# Patient Record
Sex: Female | Born: 1950 | Race: White | Hispanic: No | Marital: Married | Smoking: Current every day smoker
Health system: Southern US, Community
[De-identification: ages and names within clinical notes are randomized; demographics above are authoritative.]

## PROBLEM LIST (undated history)

## (undated) DIAGNOSIS — Z8739 Personal history of other diseases of the musculoskeletal system and connective tissue: Secondary | ICD-10-CM

## (undated) DIAGNOSIS — G8929 Other chronic pain: Secondary | ICD-10-CM

## (undated) DIAGNOSIS — H9191 Unspecified hearing loss, right ear: Secondary | ICD-10-CM

## (undated) DIAGNOSIS — K635 Polyp of colon: Secondary | ICD-10-CM

## (undated) DIAGNOSIS — K529 Noninfective gastroenteritis and colitis, unspecified: Secondary | ICD-10-CM

## (undated) DIAGNOSIS — F419 Anxiety disorder, unspecified: Secondary | ICD-10-CM

## (undated) DIAGNOSIS — F410 Panic disorder [episodic paroxysmal anxiety] without agoraphobia: Secondary | ICD-10-CM

## (undated) DIAGNOSIS — R109 Unspecified abdominal pain: Secondary | ICD-10-CM

## (undated) DIAGNOSIS — F32A Depression, unspecified: Secondary | ICD-10-CM

## (undated) DIAGNOSIS — K5090 Crohn's disease, unspecified, without complications: Secondary | ICD-10-CM

## (undated) DIAGNOSIS — I639 Cerebral infarction, unspecified: Secondary | ICD-10-CM

## (undated) DIAGNOSIS — M797 Fibromyalgia: Secondary | ICD-10-CM

## (undated) DIAGNOSIS — I671 Cerebral aneurysm, nonruptured: Secondary | ICD-10-CM

## (undated) DIAGNOSIS — R519 Headache, unspecified: Secondary | ICD-10-CM

## (undated) DIAGNOSIS — R51 Headache: Secondary | ICD-10-CM

## (undated) DIAGNOSIS — Z720 Tobacco use: Secondary | ICD-10-CM

## (undated) DIAGNOSIS — K219 Gastro-esophageal reflux disease without esophagitis: Secondary | ICD-10-CM

## (undated) DIAGNOSIS — R0789 Other chest pain: Secondary | ICD-10-CM

## (undated) DIAGNOSIS — F329 Major depressive disorder, single episode, unspecified: Secondary | ICD-10-CM

## (undated) DIAGNOSIS — K5010 Crohn's disease of large intestine without complications: Secondary | ICD-10-CM

## (undated) DIAGNOSIS — G459 Transient cerebral ischemic attack, unspecified: Secondary | ICD-10-CM

## (undated) HISTORY — PX: TUBAL LIGATION: SHX77

## (undated) HISTORY — PX: ROTATOR CUFF REPAIR: SHX139

## (undated) HISTORY — DX: Personal history of other diseases of the musculoskeletal system and connective tissue: Z87.39

## (undated) HISTORY — DX: Transient cerebral ischemic attack, unspecified: G45.9

## (undated) HISTORY — DX: Depression, unspecified: F32.A

## (undated) HISTORY — PX: CATARACT EXTRACTION: SUR2

## (undated) HISTORY — DX: Cerebral aneurysm, nonruptured: I67.1

## (undated) HISTORY — PX: ORIF ULNAR FRACTURE: SHX5417

## (undated) HISTORY — DX: Tobacco use: Z72.0

## (undated) HISTORY — DX: Anxiety disorder, unspecified: F41.9

## (undated) HISTORY — DX: Crohn's disease, unspecified, without complications: K50.90

## (undated) HISTORY — DX: Major depressive disorder, single episode, unspecified: F32.9

## (undated) HISTORY — PX: EYE SURGERY: SHX253

## (undated) HISTORY — PX: TONSILLECTOMY: SUR1361

## (undated) HISTORY — PX: VARICOSE VEIN SURGERY: SHX832

## (undated) HISTORY — DX: Unspecified hearing loss, right ear: H91.91

## (undated) HISTORY — DX: Gastro-esophageal reflux disease without esophagitis: K21.9

## (undated) HISTORY — DX: Polyp of colon: K63.5

## (undated) HISTORY — DX: Other chest pain: R07.89

## (undated) HISTORY — PX: VAGINAL HYSTERECTOMY: SUR661

## (undated) HISTORY — DX: Cerebral infarction, unspecified: I63.9

---

## 1999-03-27 ENCOUNTER — Other Ambulatory Visit: Admission: RE | Admit: 1999-03-27 | Discharge: 1999-03-27 | Payer: Self-pay | Admitting: Family Medicine

## 1999-06-06 ENCOUNTER — Ambulatory Visit (HOSPITAL_COMMUNITY): Admission: RE | Admit: 1999-06-06 | Discharge: 1999-06-06 | Payer: Self-pay | Admitting: Gastroenterology

## 1999-06-06 ENCOUNTER — Encounter: Payer: Self-pay | Admitting: Gastroenterology

## 1999-06-29 ENCOUNTER — Ambulatory Visit (HOSPITAL_COMMUNITY): Admission: RE | Admit: 1999-06-29 | Discharge: 1999-06-29 | Payer: Self-pay | Admitting: Gastroenterology

## 2000-09-09 ENCOUNTER — Other Ambulatory Visit: Admission: RE | Admit: 2000-09-09 | Discharge: 2000-09-09 | Payer: Self-pay | Admitting: Family Medicine

## 2002-09-12 ENCOUNTER — Encounter: Payer: Self-pay | Admitting: Emergency Medicine

## 2002-09-12 ENCOUNTER — Emergency Department (HOSPITAL_COMMUNITY): Admission: EM | Admit: 2002-09-12 | Discharge: 2002-09-12 | Payer: Self-pay | Admitting: Emergency Medicine

## 2002-09-14 ENCOUNTER — Ambulatory Visit (HOSPITAL_BASED_OUTPATIENT_CLINIC_OR_DEPARTMENT_OTHER): Admission: RE | Admit: 2002-09-14 | Discharge: 2002-09-14 | Payer: Self-pay | Admitting: Orthopedic Surgery

## 2003-03-05 ENCOUNTER — Other Ambulatory Visit: Admission: RE | Admit: 2003-03-05 | Discharge: 2003-03-05 | Payer: Self-pay | Admitting: Family Medicine

## 2003-05-18 ENCOUNTER — Encounter: Payer: Self-pay | Admitting: Emergency Medicine

## 2003-05-18 ENCOUNTER — Emergency Department (HOSPITAL_COMMUNITY): Admission: EM | Admit: 2003-05-18 | Discharge: 2003-05-19 | Payer: Self-pay | Admitting: Emergency Medicine

## 2003-06-26 ENCOUNTER — Ambulatory Visit (HOSPITAL_BASED_OUTPATIENT_CLINIC_OR_DEPARTMENT_OTHER): Admission: RE | Admit: 2003-06-26 | Discharge: 2003-06-26 | Payer: Self-pay | Admitting: Orthopedic Surgery

## 2003-10-19 ENCOUNTER — Encounter: Admission: RE | Admit: 2003-10-19 | Discharge: 2003-10-19 | Payer: Self-pay | Admitting: Family Medicine

## 2004-04-22 ENCOUNTER — Encounter: Payer: Self-pay | Admitting: Cardiology

## 2004-11-10 ENCOUNTER — Ambulatory Visit: Payer: Self-pay | Admitting: Cardiology

## 2004-11-11 ENCOUNTER — Inpatient Hospital Stay (HOSPITAL_COMMUNITY): Admission: EM | Admit: 2004-11-11 | Discharge: 2004-11-12 | Payer: Self-pay | Admitting: Emergency Medicine

## 2004-11-11 ENCOUNTER — Encounter: Payer: Self-pay | Admitting: Cardiology

## 2004-11-12 ENCOUNTER — Ambulatory Visit: Payer: Self-pay

## 2004-11-12 ENCOUNTER — Encounter: Payer: Self-pay | Admitting: Cardiology

## 2004-11-20 ENCOUNTER — Encounter: Payer: Self-pay | Admitting: Cardiology

## 2004-11-25 ENCOUNTER — Ambulatory Visit: Payer: Self-pay | Admitting: Cardiology

## 2005-03-11 ENCOUNTER — Emergency Department (HOSPITAL_COMMUNITY): Admission: EM | Admit: 2005-03-11 | Discharge: 2005-03-11 | Payer: Self-pay | Admitting: Emergency Medicine

## 2005-03-14 ENCOUNTER — Ambulatory Visit (HOSPITAL_COMMUNITY): Admission: RE | Admit: 2005-03-14 | Discharge: 2005-03-14 | Payer: Self-pay | Admitting: Neurology

## 2005-04-19 ENCOUNTER — Ambulatory Visit (HOSPITAL_COMMUNITY): Admission: RE | Admit: 2005-04-19 | Discharge: 2005-04-19 | Payer: Self-pay | Admitting: Cardiovascular Disease

## 2005-04-22 ENCOUNTER — Ambulatory Visit (HOSPITAL_COMMUNITY): Admission: RE | Admit: 2005-04-22 | Discharge: 2005-04-22 | Payer: Self-pay | Admitting: Neurology

## 2005-08-20 ENCOUNTER — Encounter: Admission: RE | Admit: 2005-08-20 | Discharge: 2005-08-20 | Payer: Self-pay | Admitting: Orthopedic Surgery

## 2006-06-02 ENCOUNTER — Other Ambulatory Visit: Admission: RE | Admit: 2006-06-02 | Discharge: 2006-06-02 | Payer: Self-pay | Admitting: Family Medicine

## 2006-10-28 ENCOUNTER — Ambulatory Visit: Payer: Self-pay | Admitting: Internal Medicine

## 2006-11-10 ENCOUNTER — Ambulatory Visit: Payer: Self-pay

## 2006-11-10 ENCOUNTER — Ambulatory Visit: Payer: Self-pay | Admitting: Internal Medicine

## 2006-11-23 ENCOUNTER — Ambulatory Visit: Payer: Self-pay | Admitting: Cardiology

## 2007-05-03 ENCOUNTER — Ambulatory Visit: Payer: Self-pay | Admitting: Cardiology

## 2007-05-07 ENCOUNTER — Emergency Department (HOSPITAL_COMMUNITY): Admission: EM | Admit: 2007-05-07 | Discharge: 2007-05-07 | Payer: Self-pay | Admitting: Emergency Medicine

## 2007-09-30 ENCOUNTER — Emergency Department (HOSPITAL_COMMUNITY): Admission: EM | Admit: 2007-09-30 | Discharge: 2007-09-30 | Payer: Self-pay | Admitting: Emergency Medicine

## 2008-05-23 ENCOUNTER — Ambulatory Visit (HOSPITAL_COMMUNITY): Admission: RE | Admit: 2008-05-23 | Discharge: 2008-05-23 | Payer: Self-pay | Admitting: Ophthalmology

## 2008-06-20 ENCOUNTER — Ambulatory Visit (HOSPITAL_COMMUNITY): Admission: RE | Admit: 2008-06-20 | Discharge: 2008-06-20 | Payer: Self-pay | Admitting: Ophthalmology

## 2008-11-20 ENCOUNTER — Encounter: Admission: RE | Admit: 2008-11-20 | Discharge: 2008-11-20 | Payer: Self-pay | Admitting: General Surgery

## 2009-03-04 ENCOUNTER — Inpatient Hospital Stay (HOSPITAL_COMMUNITY): Admission: EM | Admit: 2009-03-04 | Discharge: 2009-03-05 | Payer: Self-pay | Admitting: Emergency Medicine

## 2009-03-05 ENCOUNTER — Ambulatory Visit: Payer: Self-pay | Admitting: Gastroenterology

## 2009-03-12 ENCOUNTER — Ambulatory Visit (HOSPITAL_COMMUNITY): Admission: RE | Admit: 2009-03-12 | Discharge: 2009-03-12 | Payer: Self-pay | Admitting: Family Medicine

## 2009-04-15 ENCOUNTER — Encounter: Payer: Self-pay | Admitting: Gastroenterology

## 2009-05-08 ENCOUNTER — Encounter (INDEPENDENT_AMBULATORY_CARE_PROVIDER_SITE_OTHER): Payer: Self-pay | Admitting: *Deleted

## 2009-08-19 ENCOUNTER — Emergency Department (HOSPITAL_COMMUNITY): Admission: EM | Admit: 2009-08-19 | Discharge: 2009-08-19 | Payer: Self-pay | Admitting: Emergency Medicine

## 2009-10-08 ENCOUNTER — Encounter: Payer: Self-pay | Admitting: Cardiology

## 2009-12-01 ENCOUNTER — Telehealth: Payer: Self-pay | Admitting: Internal Medicine

## 2009-12-11 ENCOUNTER — Ambulatory Visit: Payer: Self-pay | Admitting: Cardiology

## 2009-12-11 DIAGNOSIS — K5090 Crohn's disease, unspecified, without complications: Secondary | ICD-10-CM

## 2009-12-11 DIAGNOSIS — R072 Precordial pain: Secondary | ICD-10-CM

## 2009-12-11 DIAGNOSIS — F17200 Nicotine dependence, unspecified, uncomplicated: Secondary | ICD-10-CM | POA: Insufficient documentation

## 2009-12-11 DIAGNOSIS — R002 Palpitations: Secondary | ICD-10-CM | POA: Insufficient documentation

## 2009-12-11 DIAGNOSIS — IMO0001 Reserved for inherently not codable concepts without codable children: Secondary | ICD-10-CM

## 2009-12-11 DIAGNOSIS — F411 Generalized anxiety disorder: Secondary | ICD-10-CM | POA: Insufficient documentation

## 2009-12-11 DIAGNOSIS — H5310 Unspecified subjective visual disturbances: Secondary | ICD-10-CM

## 2009-12-12 ENCOUNTER — Encounter: Payer: Self-pay | Admitting: Cardiology

## 2009-12-16 ENCOUNTER — Encounter: Payer: Self-pay | Admitting: Physician Assistant

## 2009-12-25 ENCOUNTER — Encounter (INDEPENDENT_AMBULATORY_CARE_PROVIDER_SITE_OTHER): Payer: Self-pay | Admitting: *Deleted

## 2010-01-01 ENCOUNTER — Encounter: Payer: Self-pay | Admitting: Cardiology

## 2010-01-01 ENCOUNTER — Ambulatory Visit: Payer: Self-pay | Admitting: Cardiology

## 2010-02-17 ENCOUNTER — Emergency Department (HOSPITAL_COMMUNITY): Admission: EM | Admit: 2010-02-17 | Discharge: 2010-02-17 | Payer: Self-pay | Admitting: Emergency Medicine

## 2010-04-15 ENCOUNTER — Ambulatory Visit: Payer: Self-pay | Admitting: Vascular Surgery

## 2010-06-10 ENCOUNTER — Ambulatory Visit (HOSPITAL_COMMUNITY): Admission: RE | Admit: 2010-06-10 | Discharge: 2010-06-10 | Payer: Self-pay | Admitting: Family Medicine

## 2010-10-29 ENCOUNTER — Emergency Department (HOSPITAL_COMMUNITY)
Admission: EM | Admit: 2010-10-29 | Discharge: 2010-10-29 | Payer: Self-pay | Source: Home / Self Care | Admitting: Emergency Medicine

## 2010-12-17 NOTE — Procedures (Signed)
Summary: Holter Monitor  Holter Monitor   Imported By: Gurney Maxin, RN, BSN 12/24/2009 10:33:59  _____________________________________________________________________  External Attachment:    Type:   Image     Comment:   External Document  Appended Document: Holter Monitor Pt notified of results by letter.

## 2010-12-17 NOTE — Progress Notes (Signed)
Summary: Office Visit (DR. Olevia Perches)  Office Visit (DR. Olevia Perches)   Imported By: Delfino Lovett 12/11/2009 09:18:20  _____________________________________________________________________  External Attachment:    Type:   Image     Comment:   External Document

## 2010-12-17 NOTE — Progress Notes (Signed)
Summary: Schedule Colonoscopy  Phone Note Outgoing Call Call back at [facility] Phone [phone]   Call placed by: Bernita Buffy CMA Deborra Medina),  December 01, 2009 10:12 AM Call placed to: Patient Summary of Call: patient was sleeping I will call her back later today Initial call taken by: Bernita Buffy CMA Deborra Medina),  December 01, 2009 10:13 AM  Follow-up for Phone Call        Left message on patients machine to call back.  Follow-up by: Bernita Buffy CMA Deborra Medina),  December 08, 2009 3:47 PM  Additional Follow-up for Phone Call Additional follow up Details #1::        No Answer Additional Follow-up by: Bernita Buffy CMA (AAMA),  December 17, 2009 12:08 PM

## 2010-12-17 NOTE — Letter (Signed)
Summary: Internal Correspondence  Internal Correspondence   Imported By: Cher Nakai 12/12/2009 10:03:16  _____________________________________________________________________  External Attachment:    Type:   Image     Comment:   External Document

## 2010-12-17 NOTE — Letter (Signed)
Summary: Risk analyst at [facility]. [address]   [place], [place] [address]   Phone: [phone]  Fax: [phone]        December 25, 2009 MRN: [id number]    [facility] [place], [place]  [address]    Dear Ms. Hickam,  Your test ordered by Rande Lawman has been reviewed by your physician (or physician assistant) and was found to be normal or stable. Your physician (or physician assistant) felt no changes were needed at this time.  ____ Echocardiogram  ____ Cardiac Stress Test  ____ Lab Work  ____ Peripheral vascular study of arms, legs or neck  ____ CT scan or X-ray  ____ Lung or Breathing test  __X__ Other: Heart Monitor   Thank you.   Gurney Maxin, RN, BSN    Bryon Lions, M.D., F.A.C.C. Maceo Pro, M.D., F.A.C.C. Cammy Copa, M.D., F.A.C.C. Vonda Antigua, M.D., F.A.C.C. Vita Barley, M.D., F.A.C.C. Mare Ferrari, M.D., F.A.C.C. Emi Belfast, PA-C

## 2010-12-17 NOTE — Progress Notes (Signed)
Summary: Office Visit  Office Visit   Imported By: Delfino Lovett 12/11/2009 09:19:17  _____________________________________________________________________  External Attachment:    Type:   Image     Comment:   External Document

## 2010-12-17 NOTE — Assessment & Plan Note (Signed)
Summary: PALPS*PT HAS SEEN [facility] IN [place]   Visit Type:  Follow-up Referring Provider:  Dr. Adriana Reams Primary Provider:  Haynes Hoehn NP  CC:  Palpitations and visual disturbances.  History of Present Illness: The patient is referred for evaluation of an episode occurring on October 5. She awoke but couldn't see or hear. She felt like she was in a tunnel. She said things slowly resolved and her hearing is back to baseline which includes some decreased hearing chronically in the right ear. She says that her site is not yet back to baseline. 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 She wonders whether this could have been involved though she is seeing a neurologist and has a CT of her head scheduled. She has also seen Dr. Redmond Pulling in ENT. She had carotid Dopplers done demonstrating less than 50% right stenosis. She is referred here for evaluation of a possible cardiac source for these complaints.  Her past cardiac history has included evaluation for chest discomfort with negative stress perfusion studies in 2005 10 2007. She does feel palpitations. These have been going on for quite a while. They may be isolated skipped beats or rapid heart rates. She feels a rapid heart rates when she is anxious. When this happens she may get diaphoretic and nauseated. 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 Though this cannot bring on chest pain she does get short of breath with this. She said since all this started she has had increased fatigue. She has felt lightheaded. She has not had syncope.  Preventive Screening-Counseling & Management  Alcohol-Tobacco     Smoking Status: current     Packs/Day: 0.33     Year Started: 1972  Comments: Pt smoked off/on since she was 19. She states 1 pk will last her about 3 days.  Current Medications (verified): 1)  Aspirin 81 Mg Tbec (Aspirin) .... Take One Tablet By Mouth Daily 2)  Multivitamins  Tabs (Multiple Vitamin) .... Take 1 Tablet By Mouth Once A Day 3)   Magnesium 300 Mg Caps (Magnesium) .... Take 1 Capsule By Mouth Once A Day 4)  Vitamin D3 1000 Unit Tabs (Cholecalciferol) .... Take 1 Tablet By Mouth Once A Day 5)  Fish Oil 1000 Mg Caps (Omega-3 Fatty Acids) .... Take 1 Capsule By Mouth Once A Day 6)  [place] Cohosh 540 Mg Caps (Friscia Cohosh) .... Take 1 Capsule By Mouth Once A Day 7)  Fluorometholone 0.1 % Susp (Fluorometholone) .... Instill 1 Drop in (L) Eye Two Times A Day 8)  Diclofenac Sodium 0.1 % Soln (Diclofenac Sodium) .... Instill 1 Drop in (B) Eyes Two Times A Day 9)  Systane 0.4-0.3 % Soln (Polyethyl Glycol-Propyl Glycol) .... Instill 1 Drop Into Each Eye Four Times Per Day As Needed Eye Dryness  Allergies: 1)  Demerol 2)  Phenergan 3)  Darvocet  Comments:  Nurse/Medical Assistant: The patient's medications were reviewed with the patient and were updated in the Medication List. Pt brought a list of medications to office visit.  Gurney Maxin, RN, BSN (December 11, 2009 1:37 PM)  Past History:  Past Medical History: Atypical chest pain. Tobacco Abuse  History of fibromyalgia.  Crohn's disease.   Depression  Anxiety  "Brain Aneurysm"  Family History: Positive for diabetes in mother and brother  Social History: One pack/3 days tobaccoSmoking Status:  current Packs/Day:  0.33  Review of Systems       Positive for headaches, fatigue, decreased vision, short-term memory problems, lower extremity  leg pain, varicose veins.  Otherwise negative for all other systems.  Vital Signs:  Patient profile:   60 year old female Height:      63 inches Weight:      120.25 pounds BMI:     21.38 Pulse rate:   80 / minute BP sitting:   104 / 70  (left arm) Cuff size:   regular  Vitals Entered By: Gurney Maxin, RN, BSN (December 11, 2009 1:27 PM) CC: Palpitations, visual disturbances Comments Pt states she had a spell on 10/5. She states it was like she was in tunnel. 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 She states hearing is some bettet  but not as good as it was. She also states vision is not completely resolved in (L) eye. She has seen Dr. Brandon Melnick (neuro) and Dr. Ok Anis (ENT).    Physical Exam  General:  Well developed, well nourished, in no acute distress. Head:  normocephalic and atraumatic Eyes:  PERRLA/EOM intact; conjunctiva and lids normal. Mouth:   Oral mucosa normal. Neck:  Neck supple, no JVD. No masses, thyromegaly or abnormal cervical nodes. Chest Wall:  no deformities or breast masses noted Lungs:  Clear bilaterally to auscultation and percussion. Abdomen:  Bowel sounds positive; abdomen soft and non-tender without masses, organomegaly, or hernias noted. No hepatosplenomegaly. Msk:  Back normal, normal gait. Muscle strength and tone normal. Extremities:  No clubbing or cyanosis. Neurologic:  Alert and oriented x 3. Skin:  Intact without lesions or rashes. Cervical Nodes:  no significant adenopathy Axillary Nodes:  no significant adenopathy Inguinal Nodes:  no significant adenopathy Psych:  Normal affect.   Detailed Cardiovascular Exam  Neck    Carotids: Carotids full and equal bilaterally without bruits.      Neck Veins: Normal, no JVD.    Heart    Inspection: no deformities or lifts noted.      Palpation: normal PMI with no thrills palpable.      Auscultation: regular rate and rhythm, S1, S2 without murmurs, rubs, gallops, or clicks.    Vascular    Abdominal Aorta: no palpable masses, pulsations, or audible bruits.      Femoral Pulses: normal femoral pulses bilaterally.      Pedal Pulses: normal pedal pulses bilaterally.      Radial Pulses: normal radial pulses bilaterally.      Peripheral Circulation: no clubbing, cyanosis, or edema noted with normal capillary refill.     Impression & Recommendations:  Problem # 1:  VISUAL IMPAIRMENT (ICD-368.10) The etiology of this is not clear. I'm bad she's having a  neurologic workup and encourage her to followup with this. I would check an echocardiogram to make sure she has a structurally normal heart and no obvious site for thromboembolism. She may need referral to an ophthalmologist.  Problem # 2:  TOBACCO ABUSE (ICD-305.1) She understands the need to stop smoking.  Problem # 3:  PALPITATIONS (ICD-785.1)  There visit small chance that this could be related to the above complaints. Therefore, I will place a 48 hour Holter monitor to see if there are any dysrhythmias such as atrial fibrillation that could be contributing.  Her updated medication list for this problem includes:    Aspirin 81 Mg Tbec (Aspirin) .Marland Kitchen... Take one tablet by mouth daily  Problem # 4:  PRECORDIAL PAIN (ICD-786.51)  She has atypical complaints and has had negative stress  perfusion studies. No further workup was suggested. Orders: EKG w/ Interpretation (93000) Holter Monitor (Holter Monitor) 2-D Echocardiogram (2D Echo)  Her updated medication list for this problem includes:    Aspirin 81 Mg Tbec (Aspirin) .Marland Kitchen... Take one tablet by mouth daily  Patient Instructions: 1)  Your physician has requested that you have an echocardiogram.  Echocardiography is a painless test that uses sound waves to create images of your heart. It provides your doctor with information about the size and shape of your heart and how well your heart's chambers and valves are working.  This procedure takes approximately one hour. There are no restrictions for this procedure. 2)  Your physician has recommended that you wear a holter monitor.  Holter monitors are medical devices that record the heart's electrical activity. Doctors most often use these monitors to diagnose arrhythmias. Arrhythmias are problems with the speed or rhythm of the heartbeat. The monitor is a small, portable device. You can wear one while you do your normal daily activities. This is usually used to diagnose what is causing  palpitations/syncope (passing out).

## 2011-01-21 DIAGNOSIS — N951 Menopausal and female climacteric states: Secondary | ICD-10-CM | POA: Insufficient documentation

## 2011-01-21 DIAGNOSIS — N952 Postmenopausal atrophic vaginitis: Secondary | ICD-10-CM | POA: Insufficient documentation

## 2011-01-21 DIAGNOSIS — H409 Unspecified glaucoma: Secondary | ICD-10-CM | POA: Insufficient documentation

## 2011-01-21 DIAGNOSIS — M810 Age-related osteoporosis without current pathological fracture: Secondary | ICD-10-CM | POA: Insufficient documentation

## 2011-01-21 DIAGNOSIS — I8390 Asymptomatic varicose veins of unspecified lower extremity: Secondary | ICD-10-CM | POA: Insufficient documentation

## 2011-01-21 DIAGNOSIS — H04129 Dry eye syndrome of unspecified lacrimal gland: Secondary | ICD-10-CM | POA: Insufficient documentation

## 2011-01-22 DIAGNOSIS — E559 Vitamin D deficiency, unspecified: Secondary | ICD-10-CM | POA: Insufficient documentation

## 2011-02-03 LAB — POCT CARDIAC MARKERS
CKMB, poc: 1.2 ng/mL (ref 1.0–8.0)
Myoglobin, poc: 40.3 ng/mL (ref 12–200)
Troponin i, poc: <0.05 ng/mL (ref 0.00–0.09)

## 2011-02-03 LAB — BASIC METABOLIC PANEL
BUN: 11 mg/dL (ref 6–23)
Creatinine, Ser: 0.86 mg/dL (ref 0.4–1.2)
GFR calc Af Amer: >60 mL/min (ref >60)
GFR calc non Af Amer: >60 mL/min (ref >60)
Potassium: 3.9 mEq/L (ref 3.5–5.1)

## 2011-02-03 LAB — DIFFERENTIAL
Lymphocytes Relative: 27 % (ref 12–46)
Lymphs Abs: 1.8 10*3/uL (ref 0.7–4.0)
Neutrophils Relative %: 62 % (ref 43–77)

## 2011-02-03 LAB — CBC
Platelets: 191 10*3/uL (ref 150–400)
RBC: 4.07 MIL/uL (ref 3.87–5.11)
WBC: 6.5 10*3/uL (ref 4.0–10.5)

## 2011-02-18 LAB — CBC
MCHC: 33.8 g/dL (ref 30.0–36.0)
MCV: 95 fL (ref 78.0–100.0)
RBC: 4.08 MIL/uL (ref 3.87–5.11)

## 2011-02-18 LAB — BASIC METABOLIC PANEL
BUN: 7 mg/dL (ref 6–23)
CO2: 28 mEq/L (ref 19–32)
Chloride: 107 mEq/L (ref 96–112)
Creatinine, Ser: 0.72 mg/dL (ref 0.4–1.2)
Glucose, Bld: 106 mg/dL — ABNORMAL HIGH (ref 70–99)

## 2011-02-18 LAB — DIFFERENTIAL
Basophils Relative: 1 % (ref 0–1)
Eosinophils Absolute: 0.1 10*3/uL (ref 0.0–0.7)
Monocytes Relative: 7 % (ref 3–12)
Neutrophils Relative %: 61 % (ref 43–77)

## 2011-02-24 LAB — URINALYSIS, ROUTINE W REFLEX MICROSCOPIC
Bilirubin Urine: NEGATIVE
Hgb urine dipstick: NEGATIVE
Protein, ur: NEGATIVE mg/dL
Urobilinogen, UA: 0.2 mg/dL (ref 0.0–1.0)

## 2011-02-24 LAB — DIFFERENTIAL
Basophils Absolute: 0.1 10*3/uL (ref 0.0–0.1)
Basophils Relative: 1 % (ref 0–1)
Eosinophils Relative: 1 % (ref 0–5)
Lymphocytes Relative: 28 % (ref 12–46)
Lymphs Abs: 2.2 10*3/uL (ref 0.7–4.0)
Monocytes Absolute: 0.4 10*3/uL (ref 0.1–1.0)
Neutro Abs: 3.8 10*3/uL (ref 1.7–7.7)
Neutro Abs: 5 10*3/uL (ref 1.7–7.7)
Neutrophils Relative %: 58 % (ref 43–77)

## 2011-02-24 LAB — HEPATIC FUNCTION PANEL
ALT: 13 U/L (ref 0–35)
AST: 23 U/L (ref 0–37)
Albumin: 3.8 g/dL (ref 3.5–5.2)
Alkaline Phosphatase: 64 U/L (ref 39–117)
Bilirubin, Direct: 0.1 mg/dL (ref 0.0–0.3)
Indirect Bilirubin: 0.2 mg/dL — ABNORMAL LOW (ref 0.3–0.9)
Total Bilirubin: 0.3 mg/dL (ref 0.3–1.2)
Total Protein: 6.1 g/dL (ref 6.0–8.3)

## 2011-02-24 LAB — CBC
HCT: 38.8 % (ref 36.0–46.0)
Hemoglobin: 13.1 g/dL (ref 12.0–15.0)
MCHC: 34.2 g/dL (ref 30.0–36.0)
Platelets: 218 10*3/uL (ref 150–400)
Platelets: 222 10*3/uL (ref 150–400)
RDW: 14.6 % (ref 11.5–15.5)
WBC: 7.7 10*3/uL (ref 4.0–10.5)

## 2011-02-24 LAB — PROTIME-INR: Prothrombin Time: 13.9 seconds (ref 11.6–15.2)

## 2011-02-24 LAB — BASIC METABOLIC PANEL
BUN: 7 mg/dL (ref 6–23)
CO2: 26 mEq/L (ref 19–32)
Calcium: 8.6 mg/dL (ref 8.4–10.5)
Calcium: 9.1 mg/dL (ref 8.4–10.5)
Creatinine, Ser: 0.79 mg/dL (ref 0.4–1.2)
GFR calc Af Amer: >60 mL/min (ref >60)
GFR calc non Af Amer: >60 mL/min (ref >60)
Glucose, Bld: 154 mg/dL — ABNORMAL HIGH (ref 70–99)
Potassium: 4.4 mEq/L (ref 3.5–5.1)
Sodium: 143 mEq/L (ref 135–145)

## 2011-02-24 LAB — APTT: aPTT: 32 seconds (ref 24–37)

## 2011-03-15 ENCOUNTER — Emergency Department (HOSPITAL_COMMUNITY)
Admission: EM | Admit: 2011-03-15 | Discharge: 2011-03-15 | Disposition: A | Discharge status: Home / Self Care | Payer: BC Managed Care – PPO | Attending: Emergency Medicine | Admitting: Emergency Medicine

## 2011-03-15 DIAGNOSIS — M2550 Pain in unspecified joint: Secondary | ICD-10-CM | POA: Insufficient documentation

## 2011-03-15 DIAGNOSIS — K5090 Crohn's disease, unspecified, without complications: Secondary | ICD-10-CM | POA: Insufficient documentation

## 2011-03-15 DIAGNOSIS — Z7982 Long term (current) use of aspirin: Secondary | ICD-10-CM | POA: Insufficient documentation

## 2011-03-15 DIAGNOSIS — R110 Nausea: Secondary | ICD-10-CM | POA: Insufficient documentation

## 2011-03-15 DIAGNOSIS — Z79899 Other long term (current) drug therapy: Secondary | ICD-10-CM | POA: Insufficient documentation

## 2011-03-15 DIAGNOSIS — R5381 Other malaise: Secondary | ICD-10-CM | POA: Insufficient documentation

## 2011-03-15 DIAGNOSIS — IMO0001 Reserved for inherently not codable concepts without codable children: Secondary | ICD-10-CM | POA: Insufficient documentation

## 2011-03-15 LAB — DIFFERENTIAL
Basophils Absolute: 0 10*3/uL (ref 0.0–0.1)
Basophils Relative: 1 % (ref 0–1)
Neutro Abs: 3.8 10*3/uL (ref 1.7–7.7)
Neutrophils Relative %: 60 % (ref 43–77)

## 2011-03-15 LAB — COMPREHENSIVE METABOLIC PANEL
ALT: 12 U/L (ref 0–35)
AST: 21 U/L (ref 0–37)
Albumin: 3.9 g/dL (ref 3.5–5.2)
CO2: 28 mEq/L (ref 19–32)
Chloride: 105 mEq/L (ref 96–112)
GFR calc Af Amer: >60 mL/min (ref >60)
GFR calc non Af Amer: >60 mL/min (ref >60)
Sodium: 139 mEq/L (ref 135–145)
Total Bilirubin: 0.5 mg/dL (ref 0.3–1.2)

## 2011-03-15 LAB — URINALYSIS, ROUTINE W REFLEX MICROSCOPIC
Hgb urine dipstick: NEGATIVE
Nitrite: NEGATIVE
Specific Gravity, Urine: 1.01 (ref 1.005–1.030)
Urobilinogen, UA: 0.2 mg/dL (ref 0.0–1.0)

## 2011-03-15 LAB — CBC
Hemoglobin: 13.1 g/dL (ref 12.0–15.0)
RBC: 4.24 MIL/uL (ref 3.87–5.11)
WBC: 6.3 10*3/uL (ref 4.0–10.5)

## 2011-03-30 NOTE — Consult Note (Signed)
NEW PATIENT CONSULTATION   Stacy Hansen, Stacy Hansen  DOB:  03/21/1951                                       04/15/2010  [id number]#:[id number]   The patient presents today for evaluation of bilateral lower extremity  venous pathology.  She is a very pleasant 60 year old white female with  progressively severe discomfort in her right leg.  She reports that this  is an aching sensation with prolonged standing in her right calf.  She  works at [employer] and has to stand for prolonged periods of time.  She  does also have pain specifically over varicosities in her medial right  calf.  She does not have similar symptoms in her left leg.  She does not  have any history of DVT or bleeding from these.  She reports some relief  with elevation but she is unable to take ibuprofen due to gastric upset.   Her past medical history is significant for diabetes, hypertension,  elevated cholesterol, cardiac disease.   Family history is negative for premature atherosclerotic disease.   SOCIAL HISTORY:  She is married with two children.  She does smoke  cigarettes, does not drink alcohol on a regular basis.   REVIEW OF SYSTEMS:  Positive for no weight loss or weight gain.  Her  weight is 120 pounds.  She is 5 feet 3 inches tall.  Cardiac, pulmonary, GI is negative except for aside from stomach  irritation with acids.  GU:  Negative.  VASCULAR:  Positive for pain in legs with prolonged standing.  NEUROLOGIC:  Report headaches.  MUSCULOSKELETAL:  Joint pain.  PSYCHIATRIC:  Anxiety and depression.  HEENT:  Change in eyesight and hearing.  SKIN:  Negative.   PHYSICAL EXAM:  General:  A well-developed, well-nourished white female  appearing stated age.  Vital signs:  Blood pressure is 109/59, pulse 73,  respirations 18.  HEENT:  Is normal.  She is in no acute distress in  general.  Abdomen:  Soft, nontender, no masses.  Her pulse status is 2+  radial and 2+ dorsalis pedis pulses bilaterally.   Neurologic:  Shows no  focal weakness or paresthesias.  Skin:  Without ulcers or rashes.  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   She underwent noninvasive vascular laboratory studies in our office and  this reveals reflux throughout her right great saphenous vein extending  into these varicosities in her calf.  Deep system shows no evidence of  DVT or reflux.  She has normal caliber saphenous vein in the left leg.  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  I discussed options  with the patient.  I explained initial treatment would be elevation and  compression garments.  She is fitted today for graduated compression  stockings thigh high 20-30 mmHg and instructed on the use of these.  We  will see her again in 3 months to determine if this is improving her  symptoms.  She also was concerned regarding the appearance of  telangiectasia on her left leg.  She does not have any evidence of DVT  or any evidence of reflux on this side and I have explained the option  of sclerotherapy for cosmetic treatment of these.  She wishes to proceed  with this and will schedule this with Thea Silversmith, RN in our office and  explained the procedure for sclerotherapy.  I will see her again in 3  months.     Rosetta Posner, M.D.  Electronically Signed   TFE/MEDQ  D:  04/15/2010  T:  04/16/2010  Job:  [id number]   cc:   Dr Haynes Hoehn

## 2011-03-30 NOTE — Procedures (Signed)
LOWER EXTREMITY VENOUS REFLUX EXAM   INDICATION:  Patient states painful right leg varicose veins and  bilateral spider veins.   EXAM:  Using color-flow imaging and pulse Doppler spectral analysis, the  right common femoral, superficial femoral, popliteal, posterior tibial,  greater and lesser saphenous veins are evaluated.  There is no evidence  suggesting deep venous insufficiency in the right lower extremity.   The right saphenofemoral junction and greater saphenous vein are not  competent with reflux of >500 milliseconds.   The right proximal short saphenous vein demonstrates competency.   GSV Diameter (used if found to be incompetent only)                                            Right    Left  Proximal Greater Saphenous Vein           0.49 cm  cm  Proximal-to-mid-thigh                     cm       cm  Mid thigh                                 0.53 cm  cm  Mid-distal thigh                          cm       cm  Distal thigh                              0.63 cm  cm  Knee                                      0.63 cm  cm   IMPRESSION:  1. Right greater saphenous vein reflux of >500 milliseconds noted.  2. The right greater saphenous vein is not tortuous  3. The right deep venous system is competent.  4. The right proximal short saphenous vein is competent.   ___________________________________________  Rosetta Posner, M.D.   CH/MEDQ  D:  04/15/2010  T:  04/15/2010  Job:  [id number]

## 2011-03-30 NOTE — Discharge Summary (Signed)
Stacy Hansen, Stacy Hansen               ACCOUNT NO.:  [id number]   MEDICAL RECORD NO.:  [id number]          PATIENT TYPE:  INP   LOCATION:  A310                          FACILITY:  [facility]   PHYSICIAN:  Salem Caster, DO    DATE OF BIRTH:  Dec 09, 1950   DATE OF ADMISSION:  03/04/2009  DATE OF DISCHARGE:  04/21/2010LH                               DISCHARGE SUMMARY   DISCHARGE DIAGNOSES:  1. Abdominal pain.  2. Nausea, vomiting, resolved.  3. Hypokalemia, resolved.  4. Past history of Crohn disease.  5. History of fibromyalgia.   BRIEF HOSPITAL COURSE:  This is a 60 year old female who presented with  a right lower quadrant abdominal pain with nausea.  The patient awoke,  she started having tingling sensation at that area.  Pain progressed  throughout the day, decided to come to the emergency room to be  evaluated.  CT scan of her abdomen was ordered.  The patient was having  problems keeping down her contrast.  The patient finally did have  contrast for her CT scan, which showed no acute abnormalities, showed  tiny nonspecific low attenuation foci in the liver too small to  characterize.  Her pelvis showed a questionable tiny amount of free  pelvic fluid.  1. Tiny umbilical hernia.  2. Otherwise, negative CT of the pelvis.  Gastroenterology was      consulted.  They had a differential of possible Crohn's flare      versus undetected appendicitis.  They have left her now to her      medical regimen.  The patient's diet was increased to full liquids,      they felt the patient tolerated this, and she will be discharged to      home with followup with his primary care physician.  The patient      has tolerated the full liquid diet, and the patient really wants to      go home, we are discharging to home.   DISCHARGE MEDICATION:  1. [place] cohosh 1 tab daily.  2. Magnesium at previous dose daily.  3. Aspirin 81 mg daily.  4. Multivitamin 1 tablet daily.  5. Morphine 1-2 tab every 4-6  hours p.r.n.   VITAL SIGNS:  Temperature is 98.1, pulse 62, respirations 18, blood  pressure 99/60.  He is stating 97% on room air.   LABORATORY DATA:  Sodium 143, potassium 4.4, chloride 110, CO2 is 29,  glucose 115, BUN 7, creatinine 0.70.  White blood cell count is 7.7,  hemoglobin of 13.1, hematocrit 38.8, platelet count is 218,000.  PT  30.9, INR 1.0, PTT is 32, total bilirubin 0.3, alkaline phosphatase 364,  AST 23, ALT 13, total protein 6.1, albumin is 3.8.   CONDITION AT DISCHARGE:  Stable.   DISPOSITION:  The patient will be discharged to home.   DISCHARGE INSTRUCTIONS:  The patient is to have a full liquid diet and  advance as tolerated.  The patient is to increase her activity slowly.  The patient is to follow up with Dr. Laurance Flatten in the next 1-2 days.  The  patient  to take all medications as directed.  The patient is to return  to the emergency room should she have any severe abdominal pain, call  her primary care physician and/or call 911.      Salem Caster, DO  Electronically Signed     SM/MEDQ  D:  03/05/2009  T:  03/06/2009  Job:  [id number]   cc:   Dr. Laurance Flatten

## 2011-03-30 NOTE — H&P (Signed)
NAMELAURALIE, BLACKSHER               ACCOUNT NO.:  [id number]   MEDICAL RECORD NO.:  [id number]          PATIENT TYPE:  INP   LOCATION:  A310                          FACILITY:  [facility]   PHYSICIAN:  Anselmo Pickler, DO    DATE OF BIRTH:  05/07/1951   DATE OF ADMISSION:  03/04/2009  DATE OF DISCHARGE:  LH                              HISTORY & PHYSICAL   PRIMARY CARE PHYSICIAN:  The patient's primary care physician is Chipper Herb, M.D.   CHIEF COMPLAINT AND HISTORY OF THE PRESENT ILLNESS:  The patient is a 60-  year-old Caucasian female who presented to the emergency room  complaining of abdominal pain.  She states that it started today.  It  felt like it was flank pain that, of course, worsened, is located on the  right side.  It is characterized as sharp and is rated as an 8/10.  It  is aggravated by nothing and it is relieved with nothing; and, the  symptoms are associated with back pain.  The patient was worked up for  what possibly could have been a kidney stone; however, only a KUB was  done.  While she was in the ED she continued to have nausea and  vomiting.  They tried to give her oral contrast and she could not  tolerate it.  At that point in time it was determined by the ED doctor,  as they were concerned about what the source or the etiology of this  abdominal pain was, but could not get testing to determine this, that  [facility] would admit her to try to get the nausea and vomiting under  control, and then try to obtain a CT of the abdomen and pelvis for  additional testing.   PAST MEDICAL HISTORY:  The patient's past medical history is significant  for:  1. Crohn's disease.  2. Fibromyalgia.  3. Aneurysm.   PAST SURGICAL HISTORY:  The past surgical history is significant for:  1. Hysterectomy.  2. Rotator cuff repair.  3. Tubal ligation.  4. Left forearm surgery.   SOCIAL HISTORY:  The patient is a smoker and smokes about a half-pack a  day.   ALLERGIES:   THE PATIENT'S ALLERGIES TO DEMEROL, PHENERGAN AND DARVOCET.   MEDICATIONS:  Premarin, aspirin, multivitamins and magnesium.   REVIEW OF SYSTEMS:  CONSTITUTIONAL:  The review of systems is positive  for changes in appetite.  Negative for fever and chills.  HEENT:  Eyes  are negative for changes in vision.  Ears, nose, mouth and throat are  negative for changes in hearing, smell or taste.  CARDIOVASCULAR:  Negative for chest pain or palpitations.  RESPIRATORY:  Negative for  shortness of breath or wheezing.  GASTROINTESTINAL:  Positive for nausea  and abdominal pain.  GENITOURINARY:  Positive for flank pain.  Negative  for dysuria.  MUSCULOSKELETAL:  Positive for arthralgias.  SKIN:  Negative for rash.  NEUROLOGICAL:  Negative for loss of consciousness.   PHYSICAL EXAMINATION:  VITAL SIGNS:  The patient's current vital signs  are as follows:  Blood pressure 96/59, pulse  rate 74, respirations 20  and temperature 97.8.  She has a pulse ox of 98% on room air.  GENERAL APPEARANCE:  In general the patient is a well-developed, well-  nourished Caucasian female who is in no acute distress.  HEENT:  Head is normocephalic and atraumatic.  Eyes are PERRLA.  EOMI.  Sclerae are clear.  No icterus or conjunctival injection.  Ears:  TMs  are visualized bilaterally.  Throat is clear.  Teeth;  upper and lower  dentures.  NECK:  The neck is supple.  No lymphadenopathy.  HEART:  The heart has a regular rate and rhythm.  LUNGS:  The lungs are clear to auscultation bilaterally.  No rales,  wheezes or rhonchi.  ABDOMEN:  The abdomen is soft, nontender and nondistended.  No  organomegaly noted.  GENITALIA AND PELVIS:  Positive right lower quadrant tenderness.  BACK:  Positive for CVA tenderness on the right side.  NEUROLOGIC:  Cranial nerves II-XII are grossly intact.  Sensation is  normal.  She is alert and oriented x3.   LABORATORY DATA:  The patient's CT of the pelvis without contrast showed  no gross  abnormalities in the pelvis and abdomen.  Sodium 140, potassium  2.9, chloride 105, carbon dioxide 26, glucose 159, BUN 70, and  creatinine 0.79.  Urine is clear.  White count 6.6, hemoglobin 13.6,  hematocrit 39.8, platelet count 222,000.   ASSESSMENT AND PLAN:  1. Abdominal pain.  2. Nausea and vomiting.  3. Hypokalemia.   The plan will be to:  1. Admit the patient to the Service of [facility].  2. We will use antiemetics and intravenous fluids until the patient is      stable enough to try oral contrast for computerized tomography of      the abdomen and pelvis.  3. I will also consult gastroenterology.  4. Await any pending results regarding the test.  5. For and hypokalemia we will go ahead and replace with intravenous      potassium until the patient is able to swallow pills.  6. We will do deep venous thrombosis and gastrointestinal prophylaxes.  7. We will continue to monitor the patient and change therapy as      necessary.      Anselmo Pickler, DO  Electronically Signed     CB/MEDQ  D:  03/05/2009  T:  03/05/2009  Job:  [id number]
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
## 2011-04-02 NOTE — Assessment & Plan Note (Signed)
[facility]                            CARDIOLOGY OFFICE NOTE   SUNNY, AGUON                      MRN:          [id number]  DATE:11/23/2006                            DOB:          05-01-1951    Mrs. Matsushima returns to my office today.  She was recently seen in the  office by Richardson Dopp.  She was complaining of chest pain at that time.  He scheduled her to have a nuclear study which was performed on November 10, 2006.  There was normal perfusion with ejection fraction of 72%.  She also had a chest CT that showed no pulmonary embolus.  There was a  small 2 mm nodule in the right upper lobe that was felt to be benign.  Since then, she continues to have chest pain.  It typically occurs with  moving her left upper extremity.   MEDICATIONS:  Include multivitamins and aspirin.   PHYSICAL EXAMINATION:  VITAL SIGNS: Blood pressure 112/71, and her pulse  is 70.  NECK:  Supple.  CHEST:  Clear.  CARDIOVASCULAR:  Regular rate and rhythm.  EXTREMITIES:  Show no edema.   DIAGNOSES:  1. Atypical chest pain.  2. Tobacco abuse.  3. History of fibromyalgia.  4. Crohn's disease.   PLAN:  Mrs. Grether continues to have chest pain, but it is reproduced on  palpation.  It is very atypical.  Her electrocardiogram was normal, and  her Myoview is normal.  CT shows no pulmonary embolus.  Her sed rate is  10.  I do not think we need to pursue further cardiac workup.  It may be  that she is having musculoskeletal pain.  I have asked her to follow up  with Dr. Laurance Flatten for this issue. Note: Tylenol has improved her chest pain  previously.  I discussed risk factor modification including  discontinuing tobacco use.  I will see her back on an as-needed basis.     Denice Bors Stanford Breed, MD, [facility]  Electronically Signed    BSC/MedQ  DD: 11/23/2006  DT: 11/23/2006  Job #: [id number]   cc:   Chipper Herb, M.D.

## 2011-04-02 NOTE — Op Note (Signed)
NAME:  Stacy Hansen, Stacy Hansen                         ACCOUNT NO.:  [id number]   MEDICAL RECORD NO.:  [id number]                   PATIENT TYPE:  AMB   LOCATION:  [place]                                  FACILITY:  [facility]   PHYSICIAN:  Estill Bamberg. Ronnie Derby, M.D.              DATE OF BIRTH:  August 17, 1951   DATE OF PROCEDURE:  06/26/2003  DATE OF DISCHARGE:                                 OPERATIVE REPORT   SURGEON:  Estill Bamberg. Ronnie Derby, M.D.   ASSISTANT:  Eulas Post, P.A.   ANESTHESIA:  General.   INDICATIONS FOR PROCEDURE:  The patient is a 60 year old with MRI evidence  and clinical exam findings of a partial- to full-thickness tear of the  rotator cuff tendon.  Informed consent was obtained.   DESCRIPTION OF PROCEDURE:  The patient was laid supine and administered  general anesthesia without interscalene block due to her denial.  She was  then placed into a beach chair position and the left shoulder was prepped  and draped in the usual sterile fashion.  Diagnostic glenohumeral  arthroscopy was performed through a posterior portal created with a #11  blade, blunt trocar and cannula.  She had a small tear in the rotator cuff  interval.  The labrum was well attached and had a good biceps anchor and no  chondromalacia.  Then created a direct lateral portal with a #11 blade,  blunt trocar and cannula.  I went from posterior into the subacromial space  with the camera and from direct-lateral with the shaver.  Used a great white  shaver to perform aggressive bursectomy in the subacromial space and the  ArthroCare wand to further clean off the bursa from the undersurface of the  acromion.  I performed an anterolateral acromioplasty with a 4 mm bur and  released the CA ligament with the ArthroCare wand and debrided it back to a  stub.  I then debrided the bursa off of the rotator cuff and identified a  very small full-thickness tear in the rotator cuff interval.  I then aborted  the arthroscopic portion of  the case and created a direct lateral mini  incision 2 cm in length.  I cauterized the bleeding skin vessels, pierced  the deltoid raphae, and held it in place with the Arthrex shoulder  retractor.  I then placed two stitches in the rotator cuff tear, which was  approximately 1 cm in length and not retracted.  It came back together very,  very easily with figure-of-eight sutures.  I then palpated the acromioplasty  to ensure it was adequate and then at this point I evacuated the subacromial  space of fluid and instruments and closed with interrupted 0 and 2-0 Vicryl  sutures and skin with Steri-Strips.  I dressed it with Adaptic, 4x4s, ABDs,  __________ into a simple sling.  Estill Bamberg. Ronnie Derby, M.D.    SDL/MEDQ  D:  06/26/2003  T:  06/26/2003  Job:  [id number]

## 2011-04-02 NOTE — H&P (Signed)
[name], Stacy Hansen               ACCOUNT NO.:  [id number]   MEDICAL RECORD NO.:  [id number]          PATIENT TYPE:  EMS   LOCATION:  MAJO                         FACILITY:  [facility]   PHYSICIAN:  Toby L. Jones Bales, M.D.   DATE OF BIRTH:  23-Oct-1951   DATE OF ADMISSION:  11/10/2004  DATE OF DISCHARGE:                                HISTORY & PHYSICAL   PRIMARY CARE PHYSICIAN:  Unassigned.   REASON FOR VISIT:  Chest pain.   HISTORY OF PRESENT ILLNESS:  Ms. Kilgore is a 60 year old female who presents  with pressure-like chest pain that developed at rest earlier today.  The  pain was 8/10 with radiation to the left shoulder and back.  There was  associated shortness of breath, diaphoresis and nausea.  In the ED, chest  pain did respond nicely to nitroglycerin; the patient says that the pain  decreased to 0.  The patient is currently chest-pain-free.  She denies any  previous episodes of chest pain.  There is no past cardiac workup.  Of note,  there was no relief with the GI cocktail.   PAST MEDICAL HISTORY:  1.  Fracture of the left distal ulna with open reduction and internal      fixation.  2.  Rotator cuff tear with repair.  3.  Crohn's disease.   PAST SURGICAL HISTORY:  1.  Open reduction and internal fixation of left distal ulna.  2.  Repair of rotator cuff.   ALLERGIES:  DEMEROL, PHENERGAN and DARVOCET.   MEDICINES:  Premarin -- patient cannot remember the exact dosage.   SOCIAL HISTORY:  The patient does smoke 1/2 pack per day for approximately  20 years.  She denies alcohol and IV drug abuse.   FAMILY HISTORY:  There is no family history of coronary artery disease or  hypertension.   REVIEW OF SYSTEMS:  A complete review of systems was obtained.  The review  of systems was negative except for that stated in the HPI.   LABORATORIES:  Troponin was negative.  White blood cell count was 8.3,  hemoglobin was 12.8, hematocrit was 37.5, platelet count was 262,000.  Sodium  136, potassium 3.9, chloride 103, carbon dioxide 28, glucose 110, BUN  was 5, creatinine was 0.7, calcium was 8.6, total protein 5.9, albumin 3.6,  AST 18, ALT 11, alkaline phosphatase 66, total bilirubin 0.4.   Chest x-ray showed pulmonary vascular congestion and mild chronic  interstitial lung disease versus mild interstitial pulmonary edema.   EKG showed normal sinus rhythm.   ASSESSMENT AND PLAN:  1.  Chest pain:  I will admit the patient to a telemetry unit.  She will be      provided oxygen, aspirin, sublingual nitroglycerin, beta blocker and      statin.  I will check a cholesterol panel.  An exercise Cardiolite      stress test will be arranged for tomorrow.  EKG will be repeated      tomorrow.  I will continue to follow cardiac enzymes.  2.  Crohn's disease:  The patient says that she is not currently  on      treatment for this.  3.  This patient is a full code.       TLF/MEDQ  D:  11/11/2004  T:  11/11/2004  Job:  [id number]
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
## 2011-04-02 NOTE — Consult Note (Signed)
NAMEELDORIS, BEISER               ACCOUNT NO.:  [id number]   MEDICAL RECORD NO.:  [id number]          PATIENT TYPE:  INP   LOCATION:  6529                         FACILITY:  [facility]   PHYSICIAN:  Kirk Ruths, M.D. LHCDATE OF BIRTH:  [birth year]   DATE OF CONSULTATION:  11/11/2004  DATE OF DISCHARGE:                                   CONSULTATION   CHIEF COMPLAINT:  Chest pain.   HISTORY OF PRESENT ILLNESS:  We were asked to see this 60 year old female  admitted to [facility] 11/10/04 by the _______ hospitalist for  evaluation of chest pain relieved by nitroglycerin.  A stress test has been  ordered for today.   The interview today was somewhat difficult as the patient just woke up and  was somewhat lethargic.  She denied any chest pain this a.m., although she  states she had some soreness in her chest.  She states the pain started  around 7 p.m. 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  She works for [employer].  The  pain was substernal and radiated to her back, was associated with shortness  of breath, a clammy feeling, nausea and dry heaves.  The pain was rated at 7  on a 1-10 scale.  She described it as sharp.  It increased with inspiration  and movement.  She never had similar pain.  It lasted 30-40 minutes.  It was  relieved in the emergency room by nitroglycerin.   PAST MEDICAL HISTORY:  1.  Fracture of the left distal ulna with open reduction internal fixation.  2.  She has had a rotator cuff repair.  3.  She has a history of Crohn's disease followed by Dr. Delfin Edis.  4.  She has had dyspnea for four months including orthopnea.   ALLERGIES:  DEMEROL, PHENERGAN, DARVOCET.   MEDICATIONS PRIOR TO ADMISSION:  1.  Premarin.  2.  Aspirin.  3.  Nitroglycerin p.r.n.  4.  Metoprolol 25 mg b.i.d.  5.  Zocor 20 mg daily.   SOCIAL HISTORY:  The patient lives in [place] with her husband.  She has  children from a previous marriage.  She continues to smoke 1/2 pack per day  and has done so for 20 years.  She does not use alcohol.  She works at [employer].  Her husband is a patient of Dr. Rosezella Florida.   FAMILY HISTORY:  Her mother died at age 89 from diabetes.  Her father died  at age 34 from cirrhosis.  She has four brothers and four sisters, none of  whom have coronary artery disease.   REVIEW OF SYSTEMS:  GENERAL:  Please see history of present illness as noted  above.  The patient has also recently had chills, sweats.  She has had  increase as well as decrease in her weight.  HEENT:  She has a history of  migraine headaches.  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  CARDIOPULMONARY:  She has had chest pain, shortness of breath, dyspnea on  exertion and orthopnea.  EXTREMITIES:  She has arthralgias with a history of  previous fractures as described above.  GASTROINTESTINAL:  She has had a  history of Crohn's disease.   PHYSICAL EXAMINATION:  GENERAL:  Pleasant 60 year old white female, somewhat  lethargic as she just woke up, in no acute distress.  VITAL SIGNS:  Blood pressure 103/46, pulse 62 and regular, respirations 18,  temperature 97, oxygen saturation 97% on room air.  HEENT:  Unremarkable.  NECK:  No bruits.  No jugular venous distension.  HEART:  Regular rate and rhythm without murmur.  LUNGS:  Decreased but clear.  ABDOMEN:  Soft, nontender.  EXTREMITIES:  Pulses are weak but intact.  There is no edema.  SKIN:  Warm and dry.   LABORATORY DATA:  Admission EKG showed mild interstitial pulmonary edema.  An EKG showed normal sinus rhythm, rate 63 beats per minute and was  interpreted as a normal EKG.  A CBC revealed hemoglobin 12.8, hematocrit  37.5, WBC 8.3 thousand, platelets 262,000.  A chemistry profile revealed BUN  5, creatinine 0.7, potassium 3.9, glucose 110.  CK-MB x1 revealed a CK of  116, MB 2.8.  Point of care enzymes were negative x2 in the emergency room.  A lipid panel is pending.  An INR is 0.8.  PTT 32.   IMPRESSION:  1.   Chest pain with negative CK-MB x1.  2.  Pulmonary edema by chest x-ray.  3.  History of tobacco use.  4.  History of Crohn's disease.  5.  Dyspnea x4 months.  6.  History of rotator cuff surgery as well as left ulnar open reduction      internal fixation.   PLAN:  1.  As noted, an exercise Cardiolite has been ordered for today.  Per Dr.      Stanford Breed we will cancel this study and await further enzymes.  2.  We will check a 2-D echocardiogram today to evaluate for wall motion      abnormalities and ejection fraction.  3.  We will also check a BNP and await further enzymes.  4.  If her echocardiogram is significantly abnormal, she will be scheduled      for cardiac catheterization.  If not, we will plan an outpatient      exercise Cardiolite in the [place] office tomorrow at 10 a.m.  5.  We have also recommended a smoking cessation consult.       DR/MEDQ  D:  11/11/2004  T:  11/11/2004  Job:  [id number]   cc:   Laurance Flatten, M.D.  [facility]   Delfin Edis, M.D. [facility]

## 2011-04-02 NOTE — Assessment & Plan Note (Signed)
[facility]                            CARDIOLOGY OFFICE NOTE   KHYLAH, Stacy                      MRN:          [id number]  DATE:10/28/2006                            DOB:          1951/02/12    CARDIOLOGIST:  She has been evaluated in the past by Dr. Stanford Breed.   PRIMARY CARE PHYSICIAN:  Dr. Redge Gainer and Juliann Pares, NP.   HISTORY OF PRESENT ILLNESS:  [name] is a 60 year old female patient  with no previously known coronary disease who had been evaluated by our  group in December 2005 for chest pain. At that time, she presented to  [facility] with atypical chest symptoms and ruled out for  myocardial infarction. She had an echocardiogram that revealed normal LV  function and no significant valvular disease. She had some pulmonary  vascular congestion, mild chronic interstitial lung disease on chest x-  ray but her BNP was normal. After ruling out for myocardial infarction,  she was set up for an outpatient adenosine Myoview study. This revealed  no ischemia and her EF was 67%. She saw Dr. Stanford Breed back on November 25, 2004 and she continued to have constant chest pain. The patient was  asked to follow up with her primary care physician at that time. Since  that time, she had complaints of headache and had seen Dr. Jannifer Franklin with  neurology. 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 This  was done in June 2006. She sees Dr. Jacolyn Reedy in neurology on an annual  basis for this. She saw Ms. Hiler at Dr. Tawanna Sat office yesterday with  complaints of chest pain. She was referred today to [place] for further  evaluation.   She notes fairly constant chest discomfort since she was evaluated by [place]  in 2005. This has never really gone away. Over the last several weeks,  it has gotten  much worse. She does describe it as a heaviness. It is  reproducible with palpitation. When she moves her left arm, it gets  worse. She feels it out into her left shoulder. When she does other  types of work, it does get worse. She sometimes gets short of breath and  nauseated. She also notes some diaphoresis. She denies any syncope but  has felt light-headed in the past. She thinks she may have some  worsening chest pain when she takes a deep breath.   PAST MEDICAL HISTORY:  She denies any history of hypertension, diabetes  mellitus, hyperlipidemia, thyroid disease or kidney disease. She is  status post hysterectomy and rotator cuff surgery on the left and ORIF  of her ulnar fracture on the left. She has been diagnosed with  fibromyalgia as well as Crohn's disease. She is followed by Dr. Delfin Edis for Crohn's disease. As noted above, she had a persistent  trigeminal artery noted a cerebral angiogram in the summer of last year.   CURRENT MEDICATIONS:  1. She  was given sublingual nitroglycerin yesterday to take p.r.n.  2. Ibuprofen 800 mg daily.  3. Premarin 0.5 mg daily.  4. Aspirin 81 mg daily.   ALLERGIES:  DEMEROL and PHENERGAN.   SOCIAL HISTORY:  She admits to smoking 1/2 a pack of cigarettes a day  for the last 20 years. Denies alcohol abuse. She works in [employer]. She  is married and has 2 children. She lives in [place], [place].   FAMILY HISTORY:  Insignificant for premature coronary artery disease.  Her father died in her 67s from a stroke. Her mother had kidney failure  from diabetes. She has 2 brothers with diabetes.   REVIEW OF SYSTEMS:  Please see HPI. She has had some mild fever and  chills. She has also had mild cough recently with green sputum. This  comes and goes throughout the year. It is secondary to her smoking. She  denies hemoptysis. She denies any melena, hematochezia, hematuria or  dysuria. Denies any symptoms consistent with amaurosis fugax or  TIAs.  She does have occasional belching and water brash symptoms. She denies  dysphagia or odynophagia. The rest of the review of systems are  negative.   PHYSICAL EXAMINATION:  GENERAL:  She is a well-nourished, well-developed  female.  VITAL SIGNS:  Blood pressure is 109/58, pulse 75, weight 119 pounds.  HEENT:  Head normocephalic. Eyes, PERRLA. EOMI. Sclera clear.  NECK:  Without JVD.  LYMPH:  Without lymphadenopathy.  ENDOCRINE:  Without thyromegaly.  VASCULAR:  Carotids without bruits bilaterally.  CARDIAC:  Normal S1, S2, regular rate and rhythm without murmurs,  clicks, rubs or gallops.  LUNGS:  Clear to auscultation bilaterally without wheezing, rhonchi or  rales.  ABDOMEN:  Soft, nontender with normal active bowel sounds. No  organomegaly.  EXTREMITIES:  Without edema. Calves soft nontender. Distal pulses  intact.  NEUROLOGIC:  She is alert and oriented x3. Cranial nerves II-XII are  grossly intact.   Chest wall significant for significant amount of pain with light  palpation over the sternum as well as left chest and in the left breast.   Electrocardiogram  from Dr. Tawanna Sat office yesterday reveals sinus  rhythm with a heart rate of 71, normal axis, nonspecific ST-T wave  changes. No acute changes.   IMPRESSION:  1. Chest discomfort.  2. Nonischemic Myoview December 2005.  3. Good left ventricular function.  4. Tobacco abuse.  5. 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  6. Fibromyalgia.  7. Crohn's disease.  8. History of left rotator cuff surgery.   PLAN:  The patient presents to the office today with complaints of chest  discomfort. She certainly has some typical features to her chest  discomfort; however, there are some atypical features as well. She has  really had continuous chest discomfort ever since we evaluated her in 2005. She lacks significant risk factors for coronary disease. Really  her only risk factor is gender, age and  smoking history. She denies any  history of hypertension, diabetes, hyperlipidemia, or family history of  coronary disease. I talked to her at length about her symptoms. She is  on Premarin and also smokes. She is certainly at slightly increased risk  for pulmonary embolus. I suggested we check a D-dimer today but she  would prefer going straight to a chest CT. Therefore we will set her up  for a chest CT to rule out pulmonary embolus. Also suggest that we set  her up for a stress Myoview study  to assess again for ischemia. Will  check a BMET, CBC and a sed rate. She does have some symptoms consistent  with gastroesophageal reflux disease. I have recommended placing her on  Protonix 40 mg a day as well. The nurse practitioner at Dr. Tawanna Sat  office already placed her on Motrin yesterday. I suggested that she go  ahead and start that as well. I have also suggested that if she ever  feels like she needs to take nitroglycerin that she should go to the  emergency room. I will bring her back in followup with Dr. Stanford Breed  after the above testing in the next couple of weeks.      Richardson Dopp, PA-C  Electronically Signed      Fay Records, MD, [facility]  Electronically Signed   SW/MedQ  DD: 10/28/2006  DT: 10/28/2006  Job #: [id number]   cc:   Chipper Herb, M.D.  Juliann Pares, NP

## 2011-04-02 NOTE — Consult Note (Signed)
NAMECORY, Stacy [facility]               ACCOUNT NO.:  [id number]   MEDICAL RECORD NO.:  [id number]          PATIENT TYPE:  OIB   LOCATION:  2857                         FACILITY:  [facility]   PHYSICIAN:  Genia Del, M.D.     DATE OF BIRTH:  04/05/1951   DATE OF CONSULTATION:  04/20/2005  DATE OF DISCHARGE:  04/19/2005                                   CONSULTATION   REASON FOR CONSULTATION:  Requested by Dr. Rollene Fare to interpret  intracranial views of carotid - cerebral arteriogram performed by Dr.  Rollene Fare 04/19/05.  Patient with abnormal MR angiogram performed March 14, 2005.   Right common carotid arteriogram intracranial views (AP, lateral, and  oblique):  There is a persistent trigeminal artery supplying distal basilar  artery which subsequently supplies the left posterior cerebral artery and  left superior cerebellar artery.  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   Left common carotid arteriogram intracranial views (AP and lateral):  Cross  filling right anterior cerebral artery branches occurs with left common  carotid artery injection.   Left vertebral arteriogram intracranial views (AP and lateral):  With  injection of the left vertebral artery there is filling of the left  posterior inferior cerebellar artery (PICA) and the left anterior inferior  cerebellar artery (AICA) as well as hypoplastic basilar artery.   Right vertebral arteriogram intracranial views (AP and lateral):  With  injection of the right vertebral artery there is filling of the right  posterior inferior cerebellar artery (PICA).Marland Kitchen   IMPRESSION:  Persistent right trigeminal artery supplies the distal basilar  artery which, itself, supplies the left posterior cerebral artery and left  superior cerebellar artery.  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   Fetal type circulation on the right with the right posterior cerebral artery  arising from the right posterior communicating artery.   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  Right  vertebral artery supplies the right posterior inferior cerebellar artery  distribution and does not fill that the hypoplastic basilar artery.      SO/MEDQ  D:  04/20/2005  T:  04/20/2005  Job:  [id number]
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
## 2011-04-02 NOTE — Discharge Summary (Signed)
Stacy Hansen, Stacy Hansen               ACCOUNT NO.:  [id number]   MEDICAL RECORD NO.:  [id number]          PATIENT TYPE:  INP   LOCATION:  6529                         FACILITY:  [facility]   PHYSICIAN:  Rexene Alberts, M.D.    DATE OF BIRTH:  Sep 20, 1951   DATE OF ADMISSION:  11/10/2004  DATE OF DISCHARGE:  11/12/2004                                 DISCHARGE SUMMARY   DISCHARGE DIAGNOSES:  1.  Chest pain.  2.  Tobacco abuse.  3.  Menopausal.   SECONDARY DISCHARGE DIAGNOSES:  1.  History of Crohn's disease.  2.  History of rotator cuff tear with repair.  3.  History of left distal ulna fracture, status post open reduction      internal fixation in the past.   DISCHARGE MEDICATIONS:  1.  Aspirin 81 mg daily.  2.  Multivitamins with iron once daily.   DISPOSITION:  The patient was discharged to home in improved and stable  condition on November 12, 2004. She has a stress test scheduled with [facility] on November 12, 2004 at 10:00 a.m.  She has a follow-up  appointment with cardiologist Dr. Stanford Breed on January 11, 12:30 p.m. She was  advised to follow-up with Dr. Laurance Flatten in 1-2 weeks.   CONSULTATIONS:  Dr. Kirk Ruths, [facility].   PROCEDURES PERFORMED:  A 2-D echocardiogram on November 11, 2004.  The  results revealed an ejection fraction of 55-65%.  This study was inadequate  for the evaluation of left ventricular regional wall motion.   HISTORY OF PRESENT ILLNESS:  The patient is a 60 year old lady with a past  medical history significant for Crohn's disease, who presented to the  emergency department on November 10, 2004 with pressure like chest pain.  Pain was rated as an 8/10 in intensity with radiation to the left shoulder  and back. This was associated with shortness of breath, diaphoresis and  nausea.  When the patient was evaluated in the emergency department, she was  given 1 nitroglycerin which decreased her pain to 0.  Therefore, the patient  was  admitted for further evaluation and management.   HOSPITAL COURSE:  PROBLEM CHEST PAIN:  As stated above, the patient was  treated with sublingual nitroglycerin which relieved her pain.  The patient  was also given a GI cocktail which apparently gave her no relief. She has  hemodynamically stable on admission.  Her EKG on admission revealed a normal  sinus rhythm with no abnormalities. Cardiac markers x2 were ordered in the  emergency department and were completely negative. Cardiac enzymes were  ordered subsequently.  The cardiac panel x1 revealed a total creatinine  kinase of 113, CK-MB of 2.5 and troponin I of 0.01.  A total cholesterol was  assessed and found to be 128.  In addition to the sublingual nitroglycerin,  the patient was started on aspirin 325 mg daily, metoprolol 25 mg b.i.d. and  Zocor 20 mg daily empirically.   Cardiology was consulted for further evaluation and management. Dr. Stanford Breed  provided the initial cardiology consultation.  Per his recommendation, a 2-D  echocardiogram  was ordered and an outpatient Cardiolite stress test was  preferred rather than in patient Cardiolite.  Given the negative cardiac  enzymes, the patient was observed for 24 hours, and then it was felt that  the patient could go home the following morning, given that she was stable  and asymptomatic overnight.  The 2-D echocardiogram revealed an overall left  ventricular systolic function that was normal with an ejection fraction  ranging between 55-65%.  There were no major valvular abnormalities.  The  study was not adequate for evaluation of left ventricular regional wall  motion.   The patient's chest x-ray on admission revealed pulmonary vascular  congestion and mild chronic interstitial lung disease, or mild interstitial  pulmonary edema.  Given these findings, a BNP was ordered and found to be  within normal limits at 88.2.  Given the patient's history of chronic  tobacco abuse, the  pulmonary findings were possibly secondary to chronic  interstitial lung disease.  The patient was given counseling on tobacco  cessation. She was advised to discontinue the Premarin, especially in  combination with tobacco use.  The patient was advised to discuss  alternative therapies for hot flashes with her primary care physician.  The  patient was informed that a combination of estrogen replacement therapy and  smoking substantially increases her risk of heart attack and stroke.   The patient was in stable and in no acute distress prior to hospital  discharge. The patient was discharged to home.  The Cardiolite stress test  has been scheduled at 10 a.m. November 12, 2004, following hospital  discharge.   The Zocor and Lopressor were discontinued at the time of hospital  discharged.  The patient's blood pressures were within normal limits at time  of hospital admission, however, after the Lopressor was started, her  systolic blood pressures fell into the low 90s.  Therefore, Lopressor was  discontinued.      Deni   DF/MEDQ  D:  11/13/2004  T:  11/13/2004  Job:  [id number]   cc:   Chipper Herb, M.D.  [place]  [place] [address]  Fax: [phone]   Kirk Ruths, M.D. [facility]
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
## 2011-05-18 ENCOUNTER — Encounter: Payer: Self-pay | Admitting: Cardiology

## 2011-07-09 ENCOUNTER — Emergency Department (HOSPITAL_COMMUNITY)
Admission: EM | Admit: 2011-07-09 | Discharge: 2011-07-09 | Disposition: A | Discharge status: Home / Self Care | Payer: BC Managed Care – PPO | Attending: Emergency Medicine | Admitting: Emergency Medicine

## 2011-07-09 ENCOUNTER — Encounter (HOSPITAL_COMMUNITY): Payer: Self-pay | Admitting: *Deleted

## 2011-07-09 ENCOUNTER — Emergency Department (HOSPITAL_COMMUNITY): Payer: BC Managed Care – PPO

## 2011-07-09 DIAGNOSIS — K5090 Crohn's disease, unspecified, without complications: Secondary | ICD-10-CM | POA: Insufficient documentation

## 2011-07-09 DIAGNOSIS — Z79899 Other long term (current) drug therapy: Secondary | ICD-10-CM | POA: Insufficient documentation

## 2011-07-09 DIAGNOSIS — Z7982 Long term (current) use of aspirin: Secondary | ICD-10-CM | POA: Insufficient documentation

## 2011-07-09 DIAGNOSIS — IMO0001 Reserved for inherently not codable concepts without codable children: Secondary | ICD-10-CM | POA: Insufficient documentation

## 2011-07-09 DIAGNOSIS — R51 Headache: Secondary | ICD-10-CM | POA: Insufficient documentation

## 2011-07-09 DIAGNOSIS — N390 Urinary tract infection, site not specified: Secondary | ICD-10-CM

## 2011-07-09 DIAGNOSIS — F17200 Nicotine dependence, unspecified, uncomplicated: Secondary | ICD-10-CM | POA: Insufficient documentation

## 2011-07-09 DIAGNOSIS — R079 Chest pain, unspecified: Secondary | ICD-10-CM | POA: Insufficient documentation

## 2011-07-09 DIAGNOSIS — M5430 Sciatica, unspecified side: Secondary | ICD-10-CM | POA: Insufficient documentation

## 2011-07-09 LAB — URINALYSIS, ROUTINE W REFLEX MICROSCOPIC
Bilirubin Urine: NEGATIVE
Glucose, UA: NEGATIVE mg/dL
Ketones, ur: NEGATIVE mg/dL
Leukocytes, UA: NEGATIVE
Specific Gravity, Urine: <1.005 — ABNORMAL LOW (ref 1.005–1.030)
pH: 6.5 (ref 5.0–8.0)

## 2011-07-09 LAB — DIFFERENTIAL
Basophils Relative: 1 % (ref 0–1)
Lymphs Abs: 2 10*3/uL (ref 0.7–4.0)
Monocytes Absolute: 0.4 10*3/uL (ref 0.1–1.0)
Monocytes Relative: 6 % (ref 3–12)
Neutro Abs: 4.1 10*3/uL (ref 1.7–7.7)
Neutrophils Relative %: 61 % (ref 43–77)

## 2011-07-09 LAB — BASIC METABOLIC PANEL
BUN: 9 mg/dL (ref 6–23)
CO2: 29 mEq/L (ref 19–32)
Chloride: 101 mEq/L (ref 96–112)
Creatinine, Ser: 0.63 mg/dL (ref 0.50–1.10)
Glucose, Bld: 107 mg/dL — ABNORMAL HIGH (ref 70–99)
Potassium: 4.1 mEq/L (ref 3.5–5.1)

## 2011-07-09 LAB — CBC
HCT: 40.9 % (ref 36.0–46.0)
Hemoglobin: 13.4 g/dL (ref 12.0–15.0)
MCHC: 32.8 g/dL (ref 30.0–36.0)
RBC: 4.38 MIL/uL (ref 3.87–5.11)

## 2011-07-09 MED ORDER — PHENAZOPYRIDINE HCL 200 MG PO TABS: 200.0000 mg | ORAL_TABLET | Freq: Three times a day (TID) | ORAL | Status: AC

## 2011-07-09 MED ORDER — CEPHALEXIN 500 MG PO CAPS: 500.0000 mg | ORAL_CAPSULE | Freq: Four times a day (QID) | ORAL | Status: AC

## 2011-07-09 NOTE — ED Notes (Signed)
Patient given Ginger Ale and peanut butter crackers per request.  Ok per Olga Millers, PA.

## 2011-07-09 NOTE — ED Provider Notes (Deleted)
History     CSN: [id number] Arrival date & time: 07/09/2011  6:06 PM  Chief Complaint  Patient presents with  . Allergic Reaction   Patient is a 60 y.o. female presenting with back pain. The history is provided by the patient.  Back Pain  This is a new problem. The current episode started more than 2 days ago. The problem occurs constantly. The problem has not changed since onset.The pain is associated with no known injury. The pain is present in the lumbar spine. The quality of the pain is described as aching and cramping. Radiates to: Left anterior thigh. The pain is at a severity of 5/10. The pain is moderate. The symptoms are aggravated by bending, twisting and certain positions. The pain is the same all the time. Associated symptoms include chest pain, headaches and dysuria. Pertinent negatives include no fever, no numbness, no abdominal pain, no abdominal swelling, no bowel incontinence, no paresthesias, no paresis, no tingling and no weakness. Associated symptoms comments: She was prescribed cipro for a uti by her pcp 3 days ago, as she has suprapubic pressure sensation and increased urinary frequency,  But no pain or hematuria.   But this medicine has caused nausea and vomiting, reporting only able to keep one or two pills down.  She continues to have low back pain and suprapubic pressure. She has also developed midsternal chest pain which is worse with cough and palpation,  Has had yellow sputum production.  Denies fever.. The treatment provided moderate relief.    Past Medical History  Diagnosis Date  . Atypical chest pain   . Tobacco abuse   . History of fibromyalgia   . Crohn disease   . Depression   . Anxiety   . Brain aneurysm     Past Surgical History  Procedure Date  . Arm surgery     left   . Shoulder surgery     left  . Abdominal hysterectomy     Family History  Problem Relation Age of Onset  . Diabetes Mother   . Diabetes Brother     History  Substance Use  Topics  . Smoking status: Current Some Day Smoker -- 1.0 packs/day    Types: Cigarettes  . Smokeless tobacco: Not on file   Comment: one pack/ 3 days  . Alcohol Use: No    OB History    Grav Para Term Preterm Abortions TAB SAB Ect Mult Living                  Review of Systems  Constitutional: Negative for fever, chills and fatigue.  HENT: Negative for congestion, sore throat and neck pain.   Eyes: Negative.   Respiratory: Negative for chest tightness and shortness of breath.   Cardiovascular: Positive for chest pain.  Gastrointestinal: Positive for nausea. Negative for vomiting, abdominal pain, diarrhea, constipation and bowel incontinence.  Genitourinary: Positive for dysuria and frequency. Negative for urgency, hematuria and vaginal discharge.  Musculoskeletal: Positive for back pain. Negative for joint swelling and arthralgias.  Skin: Negative.  Negative for rash and wound.  Neurological: Positive for headaches. Negative for dizziness, tingling, weakness, light-headedness, numbness and paresthesias.  Hematological: Negative.   Psychiatric/Behavioral: Negative.     Physical Exam  BP 105/34  Pulse 69  Temp(Src) 98.2 F (36.8 C) (Oral)  Resp 18  Ht 5' 3"  (1.6 m)  Wt 115 lb (52.164 kg)  BMI 20.37 kg/m2  SpO2 100%  Physical Exam  Nursing note and vitals reviewed.  Constitutional: She is oriented to person, place, and time. She appears well-developed and well-nourished.  HENT:  Head: Normocephalic and atraumatic.  Eyes: Conjunctivae are normal.  Neck: Normal range of motion. Neck supple.  Cardiovascular: Normal rate, regular rhythm, normal heart sounds and intact distal pulses.        Pedal pulses normal.  Pulmonary/Chest: Effort normal and breath sounds normal. She has no wheezes.  Abdominal: Soft. Bowel sounds are normal. She exhibits no distension and no mass. There is no tenderness.  Musculoskeletal: Normal range of motion. She exhibits no edema.       Lumbar  back: She exhibits tenderness. She exhibits no swelling, no edema and no spasm.  Neurological: She is alert and oriented to person, place, and time. She has normal strength. She displays no atrophy and no tremor. No cranial nerve deficit or sensory deficit. Gait normal.  Reflex Scores:      Patellar reflexes are 2+ on the right side and 2+ on the left side.      Achilles reflexes are 2+ on the right side and 2+ on the left side.      No strength deficit noted in hip and knee flexor and extensor muscle groups.  Ankle flexion and extension intact.  Skin: Skin is warm and dry.  Psychiatric: She has a normal mood and affect.    ED Course  Procedures  MDM Urine culture sent.  Patient with continued suprapubic pressure and urinary frequency - despite normal UA,  But only partially treated uti.  Urine cx sent.  Will cover with keflex.  Add pain medicine - f/u pcp if not improved.      History/physical exam/procedure(s) were performed by non-physician practitioner and as supervising physician I was immediately available for consultation/collaboration. I have reviewed all notes and am in agreement with care and plan.      [place] Reek, PA 07/09/11 [id number]  Shaune Pollack, MD 07/10/11 [phone]

## 2011-07-09 NOTE — ED Notes (Signed)
Pt c/o lower abdominal pain, lower back pain, seeing spots, headache and tightness in her chest since 1 pm. Pt states that they put her on several new meds on Tuesday. Pt denies urinary symptoms. Pt constipated with no BM since Tuesday. Pt denies urinary symptoms. Seen on Tuesday at [facility] and told that her sciatic nerve was messed up and she had bacteria in her urine.

## 2011-07-11 LAB — URINE CULTURE
Colony Count: NO GROWTH
Culture  Setup Time: 201208252016
Culture: NO GROWTH

## 2011-07-12 ENCOUNTER — Emergency Department (HOSPITAL_COMMUNITY)
Admission: EM | Admit: 2011-07-12 | Discharge: 2011-07-12 | Disposition: A | Discharge status: Home / Self Care | Payer: BC Managed Care – PPO | Attending: Emergency Medicine | Admitting: Emergency Medicine

## 2011-07-12 ENCOUNTER — Emergency Department (HOSPITAL_COMMUNITY): Payer: BC Managed Care – PPO

## 2011-07-12 DIAGNOSIS — M545 Low back pain, unspecified: Secondary | ICD-10-CM | POA: Insufficient documentation

## 2011-07-12 DIAGNOSIS — IMO0001 Reserved for inherently not codable concepts without codable children: Secondary | ICD-10-CM | POA: Insufficient documentation

## 2011-07-12 DIAGNOSIS — K5090 Crohn's disease, unspecified, without complications: Secondary | ICD-10-CM | POA: Insufficient documentation

## 2011-07-12 LAB — URINE MICROSCOPIC-ADD ON

## 2011-07-12 LAB — URINALYSIS, ROUTINE W REFLEX MICROSCOPIC
Glucose, UA: NEGATIVE mg/dL
Hgb urine dipstick: NEGATIVE
pH: 5.5 (ref 5.0–8.0)

## 2011-07-14 LAB — URINE CULTURE: Culture  Setup Time: 201208280450

## 2011-08-12 LAB — BASIC METABOLIC PANEL
BUN: 11
CO2: 27
Chloride: 104
Creatinine, Ser: 0.82
Glucose, Bld: 108 — ABNORMAL HIGH
Potassium: 4

## 2011-08-12 LAB — HEMOGLOBIN AND HEMATOCRIT, BLOOD: Hemoglobin: 13

## 2011-08-13 LAB — BASIC METABOLIC PANEL
BUN: 10
Chloride: 106
GFR calc non Af Amer: >60
Glucose, Bld: 108 — ABNORMAL HIGH
Potassium: 4.2
Sodium: 141

## 2011-08-24 LAB — CBC
Hemoglobin: 14.5
MCV: 93.2
RBC: 4.72
WBC: 8.8

## 2011-08-24 LAB — I-STAT 8, (EC8 V) (CONVERTED LAB)
Bicarbonate: 30.3 — ABNORMAL HIGH
HCT: 49 — ABNORMAL HIGH
Hemoglobin: 16.7 — ABNORMAL HIGH
Operator id: 192351
Potassium: 4.2
Sodium: 140
TCO2: 32

## 2011-08-24 LAB — POCT CARDIAC MARKERS
CKMB, poc: 1.4
Myoglobin, poc: 82.1
Troponin i, poc: <0.05

## 2011-08-24 LAB — DIFFERENTIAL
Eosinophils Absolute: 0.3
Lymphs Abs: 3
Monocytes Absolute: 0.4
Monocytes Relative: 5
Neutro Abs: 5.1
Neutrophils Relative %: 57

## 2011-08-24 LAB — D-DIMER, QUANTITATIVE: D-Dimer, Quant: 0.25

## 2011-11-13 ENCOUNTER — Emergency Department (HOSPITAL_COMMUNITY): Payer: BC Managed Care – PPO

## 2011-11-13 ENCOUNTER — Encounter (HOSPITAL_COMMUNITY): Payer: Self-pay

## 2011-11-13 ENCOUNTER — Emergency Department (HOSPITAL_COMMUNITY)
Admission: EM | Admit: 2011-11-13 | Discharge: 2011-11-13 | Disposition: A | Discharge status: Home / Self Care | Payer: BC Managed Care – PPO | Attending: Emergency Medicine | Admitting: Emergency Medicine

## 2011-11-13 DIAGNOSIS — K5090 Crohn's disease, unspecified, without complications: Secondary | ICD-10-CM | POA: Insufficient documentation

## 2011-11-13 DIAGNOSIS — F17200 Nicotine dependence, unspecified, uncomplicated: Secondary | ICD-10-CM | POA: Insufficient documentation

## 2011-11-13 DIAGNOSIS — F3289 Other specified depressive episodes: Secondary | ICD-10-CM | POA: Insufficient documentation

## 2011-11-13 DIAGNOSIS — F329 Major depressive disorder, single episode, unspecified: Secondary | ICD-10-CM | POA: Insufficient documentation

## 2011-11-13 DIAGNOSIS — J40 Bronchitis, not specified as acute or chronic: Secondary | ICD-10-CM

## 2011-11-13 DIAGNOSIS — J320 Chronic maxillary sinusitis: Secondary | ICD-10-CM | POA: Insufficient documentation

## 2011-11-13 DIAGNOSIS — F411 Generalized anxiety disorder: Secondary | ICD-10-CM | POA: Insufficient documentation

## 2011-11-13 DIAGNOSIS — IMO0001 Reserved for inherently not codable concepts without codable children: Secondary | ICD-10-CM | POA: Insufficient documentation

## 2011-11-13 MED ORDER — GUAIFENESIN-CODEINE 100-10 MG/5ML PO SYRP: ORAL_SOLUTION | ORAL | Status: DC

## 2011-11-13 MED ORDER — AMOXICILLIN-POT CLAVULANATE 875-125 MG PO TABS: 1.0000 | ORAL_TABLET | Freq: Two times a day (BID) | ORAL | Status: AC

## 2011-11-13 NOTE — ED Notes (Signed)
Pt presents with cough, nasal congestion, body aches, and bilateral ear pain that started 11/03/2011. Pt states she has been on 2 rounds of ABX with no relief.

## 2011-11-13 NOTE — ED Provider Notes (Signed)
History     CSN: [id number]  Arrival date & time 11/13/11  1058   First MD Initiated Contact with Patient 11/13/11 1139      Chief Complaint  Patient presents with  . Nasal Congestion  . Generalized Body Aches  . Cough  . Otalgia    (Consider location/radiation/quality/duration/timing/severity/associated sxs/prior treatment) Patient is a 60 y.o. female presenting with cough and ear pain. The history is provided by the patient. No language interpreter was used.  Cough This is a chronic problem. Episode onset: onset 11-03-2011. The problem occurs constantly. The problem has been gradually worsening. The cough is non-productive. Associated symptoms include ear pain, sore throat and myalgias. Associated symptoms comments: Sore throat and cough.. Improvement on treatment: pt has completed a 10 day regimen of amoxicillin and is currently takin a z-pack.  will complete in 2 daya. Her past medical history is significant for bronchitis and pneumonia.  Otalgia Associated symptoms include sore throat and cough.    Past Medical History  Diagnosis Date  . Atypical chest pain   . Tobacco abuse   . History of fibromyalgia   . Crohn disease   . Depression   . Anxiety   . Brain aneurysm     Past Surgical History  Procedure Date  . Arm surgery     left   . Shoulder surgery     left  . Abdominal hysterectomy     Family History  Problem Relation Age of Onset  . Diabetes Mother   . Diabetes Brother     History  Substance Use Topics  . Smoking status: Current Some Day Smoker -- 1.0 packs/day    Types: Cigarettes  . Smokeless tobacco: Not on file   Comment: one pack/ 3 days  . Alcohol Use: No    OB History    Grav Para Term Preterm Abortions TAB SAB Ect Mult Living                  Review of Systems  Constitutional: Positive for fever.  HENT: Positive for ear pain and sore throat.   Respiratory: Positive for cough.   Musculoskeletal: Positive for myalgias.  All other  systems reviewed and are negative.    Allergies  Ciprofloxacin; Meperidine hcl; Promethazine hcl; and Propoxyphene n-acetaminophen  Home Medications   Current Outpatient Rx  Name Route Sig Dispense Refill  . ACETAMINOPHEN-CODEINE #3 300-30 MG PO TABS Oral Take 1 tablet by mouth every 4 (four) hours as needed.      . ASPIRIN 81 MG PO TBEC Oral Take 81 mg by mouth daily.      . AZITHROMYCIN 250 MG PO TABS Oral Take 250 mg by mouth daily.      Marland Kitchen FLUTICASONE FUROATE 27.5 MCG/SPRAY NA SUSP Nasal Place 2 sprays into the nose daily.      Marland Kitchen [place] COHOSH 540 MG PO CAPS Oral Take 1 capsule by mouth daily.      Marland Kitchen VITAMIN D3 1000 UNITS PO TABS Oral Take 1,000 Units by mouth daily.      Marland Kitchen DICLOFENAC SODIUM 0.1 % OP SOLN Both Eyes Place 1 drop into both eyes 2 (two) times daily.      Marland Kitchen DICLOFENAC SODIUM 1 % TD GEL Topical Apply 1 application topically 4 (four) times daily. For pain     . OMEGA-3 FATTY ACIDS 1000 MG PO CAPS Oral Take 1 capsule by mouth daily.      Marland Kitchen FLUOROMETHOLONE 0.1 % OP SUSP Left Eye  Place 1 drop into the left eye 2 (two) times daily.      Marland Kitchen MAGNESIUM 300 MG PO CAPS Oral Take 1 capsule by mouth daily.      Marland Kitchen METHOCARBAMOL 750 MG PO TABS Oral Take 750 mg by mouth at bedtime.      Marland Kitchen ONE-DAILY MULTI VITAMINS PO TABS Oral Take 1 tablet by mouth daily.      Marland Kitchen OVER THE COUNTER MEDICATION Oral Take 1 each by mouth as needed. For constipation: EX-LAX Chocolate     . POLYETHYL GLYCOL-PROPYL GLYCOL 0.4-0.3 % OP SOLN Ophthalmic Apply 1 drop to eye 4 (four) times daily as needed.      Marland Kitchen TRAMADOL HCL 50 MG PO TABS Oral Take 50 mg by mouth every 8 (eight) hours as needed.        BP 123/59  Pulse 97  Temp(Src) 97.7 F (36.5 C) (Oral)  Resp 20  Ht 5' 3"  (1.6 m)  Wt 120 lb (54.432 kg)  BMI 21.26 kg/m2  SpO2 100%  Physical Exam  Nursing note and vitals reviewed. Constitutional: She is oriented to person, place, and time. She appears well-developed and well-nourished. No distress.  HENT:   Head: Normocephalic and atraumatic.  Right Ear: External ear normal.  Left Ear: External ear normal.  Nose: Nose normal.  Mouth/Throat: Oropharynx is clear and moist. No oropharyngeal exudate.  Eyes: EOM are normal.  Neck: Normal range of motion.  Cardiovascular: Normal rate, regular rhythm, S1 normal, S2 normal and normal heart sounds.   Pulmonary/Chest: Effort normal and breath sounds normal. No respiratory distress. She has no decreased breath sounds. She has no wheezes. She has no rhonchi. She has no rales. She exhibits no tenderness.  Abdominal: Soft. She exhibits no distension. There is no tenderness.  Musculoskeletal: Normal range of motion.  Neurological: She is alert and oriented to person, place, and time.  Skin: Skin is warm and dry. She is not diaphoretic.  Psychiatric: She has a normal mood and affect. Judgment normal.    ED Course  Procedures (including critical care time)  Labs Reviewed - No data to display Dg Chest 2 View  11/13/2011  *RADIOLOGY REPORT*  Clinical Data: Fever and cough.  Chest congestion.  CHEST - 2 VIEW  Comparison: 07/09/2011  Findings: Heart size and vascularity are normal and the lungs are clear.  No osseous abnormality.  IMPRESSION: No acute disease.  Original Report Authenticated By: Larey Seat, M.D.     No diagnosis found.    [place], [place] 11/13/11 [id number]

## 2011-11-13 NOTE — ED Notes (Signed)
Pt states has been sick since 12/19 at which time she seen her private MD and was placed on amoxicillin and prednisolone without relief.  Pt returned to PCP 12/26 at which time she was instructed to stop taking  previous medication and was started on fluticasone nasal spray, z-pack and tylenol #3 with no relief. Pt states continues to have low grade fever, chills, nasal congestion, nonproductive cough and sinus pressure.  Pt denies having a chest x-ray at MD'S office.

## 2011-11-13 NOTE — ED Provider Notes (Signed)
Medical screening examination/treatment/procedure(s) were performed by non-physician practitioner and as supervising physician I was immediately available for consultation/collaboration.   Ezequiel Essex, MD 11/13/11 [id number]

## 2012-03-01 ENCOUNTER — Ambulatory Visit
Admission: RE | Admit: 2012-03-01 | Discharge: 2012-03-01 | Disposition: A | Discharge status: Home / Self Care | Payer: BC Managed Care – PPO | Source: Ambulatory Visit | Attending: Otolaryngology | Admitting: Otolaryngology

## 2012-03-01 ENCOUNTER — Other Ambulatory Visit: Payer: Self-pay | Admitting: Otolaryngology

## 2012-03-01 DIAGNOSIS — J3489 Other specified disorders of nose and nasal sinuses: Secondary | ICD-10-CM

## 2012-03-01 DIAGNOSIS — R05 Cough: Secondary | ICD-10-CM

## 2012-05-03 ENCOUNTER — Ambulatory Visit: Payer: BC Managed Care – PPO

## 2012-05-05 ENCOUNTER — Encounter (HOSPITAL_COMMUNITY): Payer: Self-pay | Admitting: Neurology

## 2012-05-05 ENCOUNTER — Observation Stay (HOSPITAL_COMMUNITY)
Admission: EM | Admit: 2012-05-05 | Discharge: 2012-05-06 | Disposition: A | Discharge status: Home / Self Care | Payer: BC Managed Care – PPO | Attending: Internal Medicine | Admitting: Internal Medicine

## 2012-05-05 ENCOUNTER — Emergency Department (HOSPITAL_COMMUNITY): Payer: BC Managed Care – PPO

## 2012-05-05 DIAGNOSIS — R42 Dizziness and giddiness: Secondary | ICD-10-CM

## 2012-05-05 DIAGNOSIS — K5090 Crohn's disease, unspecified, without complications: Secondary | ICD-10-CM | POA: Insufficient documentation

## 2012-05-05 DIAGNOSIS — R079 Chest pain, unspecified: Secondary | ICD-10-CM

## 2012-05-05 DIAGNOSIS — F17200 Nicotine dependence, unspecified, uncomplicated: Secondary | ICD-10-CM

## 2012-05-05 DIAGNOSIS — F411 Generalized anxiety disorder: Secondary | ICD-10-CM | POA: Diagnosis present

## 2012-05-05 DIAGNOSIS — R072 Precordial pain: Principal | ICD-10-CM | POA: Diagnosis present

## 2012-05-05 DIAGNOSIS — IMO0001 Reserved for inherently not codable concepts without codable children: Secondary | ICD-10-CM | POA: Insufficient documentation

## 2012-05-05 DIAGNOSIS — B9789 Other viral agents as the cause of diseases classified elsewhere: Secondary | ICD-10-CM | POA: Insufficient documentation

## 2012-05-05 DIAGNOSIS — R209 Unspecified disturbances of skin sensation: Secondary | ICD-10-CM | POA: Insufficient documentation

## 2012-05-05 LAB — BASIC METABOLIC PANEL
Chloride: 101 mEq/L (ref 96–112)
Creatinine, Ser: 0.81 mg/dL (ref 0.50–1.10)
GFR calc Af Amer: 90 mL/min — ABNORMAL LOW (ref >90)
Potassium: 4.4 mEq/L (ref 3.5–5.1)
Sodium: 138 mEq/L (ref 135–145)

## 2012-05-05 LAB — POCT I-STAT TROPONIN I: Troponin i, poc: 0 ng/mL (ref 0.00–0.08)

## 2012-05-05 LAB — DIFFERENTIAL
Basophils Absolute: 0 10*3/uL (ref 0.0–0.1)
Basophils Relative: 1 % (ref 0–1)
Monocytes Absolute: 0.4 10*3/uL (ref 0.1–1.0)
Neutro Abs: 4.1 10*3/uL (ref 1.7–7.7)
Neutrophils Relative %: 60 % (ref 43–77)

## 2012-05-05 LAB — CBC
MCHC: 33.5 g/dL (ref 30.0–36.0)
Platelets: 187 10*3/uL (ref 150–400)
RDW: 13.2 % (ref 11.5–15.5)
WBC: 6.8 10*3/uL (ref 4.0–10.5)

## 2012-05-05 MED ORDER — ASPIRIN EC 81 MG PO TBEC
81.0000 mg | DELAYED_RELEASE_TABLET | Freq: Every day | ORAL | Status: DC
  Administered 2012-05-06: 81 mg via ORAL
  Filled 2012-05-05: qty 1

## 2012-05-05 MED ORDER — ADULT MULTIVITAMIN W/MINERALS CH
1.0000 | ORAL_TABLET | Freq: Every day | ORAL | Status: DC
  Administered 2012-05-06: 1 via ORAL
  Filled 2012-05-05: qty 1

## 2012-05-05 MED ORDER — ENOXAPARIN SODIUM 40 MG/0.4ML ~~LOC~~ SOLN
40.0000 mg | SUBCUTANEOUS | Status: DC
  Administered 2012-05-06: 40 mg via SUBCUTANEOUS
  Filled 2012-05-05: qty 0.4

## 2012-05-05 MED ORDER — OXYCODONE-ACETAMINOPHEN 5-325 MG PO TABS
1.0000 | ORAL_TABLET | Freq: Once | ORAL | Status: AC
  Administered 2012-05-05: 1 via ORAL
  Filled 2012-05-05: qty 1

## 2012-05-05 MED ORDER — POLYVINYL ALCOHOL 1.4 % OP SOLN: 1.0000 [drp] | Freq: Four times a day (QID) | OPHTHALMIC | Status: DC | PRN

## 2012-05-05 MED ORDER — ONDANSETRON HCL 4 MG PO TABS: 4.0000 mg | ORAL_TABLET | Freq: Four times a day (QID) | ORAL | Status: DC | PRN

## 2012-05-05 MED ORDER — SODIUM CHLORIDE 0.9 % IV SOLN
INTRAVENOUS | Status: DC
  Administered 2012-05-05 – 2012-05-06 (×3): via INTRAVENOUS

## 2012-05-05 MED ORDER — LORAZEPAM 0.5 MG PO TABS: 0.5000 mg | ORAL_TABLET | Freq: Every day | ORAL | Status: DC | PRN

## 2012-05-05 MED ORDER — SODIUM CHLORIDE 0.9 % IJ SOLN
3.0000 mL | Freq: Two times a day (BID) | INTRAMUSCULAR | Status: DC
  Administered 2012-05-05: 3 mL via INTRAVENOUS

## 2012-05-05 MED ORDER — OCUVITE-LUTEIN PO CAPS
1.0000 | ORAL_CAPSULE | Freq: Every day | ORAL | Status: DC
  Administered 2012-05-06: 1 via ORAL
  Filled 2012-05-05: qty 1

## 2012-05-05 MED ORDER — HYDROMORPHONE HCL PF 1 MG/ML IJ SOLN: 1.0000 mg | INTRAMUSCULAR | Status: DC | PRN

## 2012-05-05 MED ORDER — ONDANSETRON HCL 4 MG/2ML IJ SOLN: 4.0000 mg | Freq: Four times a day (QID) | INTRAMUSCULAR | Status: DC | PRN

## 2012-05-05 NOTE — ED Notes (Signed)
DR. Christy Gentles AT BEDSIDE SPEAKING WITH PT. AND FAMILY .

## 2012-05-05 NOTE — ED Notes (Signed)
Pt returned from xray, husband at bedside.

## 2012-05-05 NOTE — ED Notes (Signed)
DR. Christy Gentles ( EDP) AT BEDSIDE UPDATING PT. ON TEST RESULTS AND PLAN OF CARE .

## 2012-05-05 NOTE — Plan of Care (Signed)
Problem: Consults Goal: Tobacco Cessation referral if indicated Outcome: Not Applicable Date Met:  56/15/37 Pt quit March 07, 2012.

## 2012-05-05 NOTE — ED Provider Notes (Signed)
History     CSN: [id number]  Arrival date & time 05/05/12  1655   First MD Initiated Contact with Patient 05/05/12 1712      Chief Complaint  Patient presents with  . Chest Pain     Patient is a 61 y.o. female presenting with chest pain. The history is provided by the patient.  Chest Pain Episode onset: several hrs ago. Chest pain occurs constantly. The chest pain is unchanged. Associated with: positional. The severity of the pain is moderate. The quality of the pain is described as pressure-like. The pain does not radiate. Chest pain is worsened by certain positions. Primary symptoms include nausea. Pertinent negatives for primary symptoms include no fever, no syncope, no abdominal pain and no vomiting.  Associated symptoms include diaphoresis. She tried aspirin for the symptoms.   pt reports she was at work (works at [employer], was not exerting herself) and she developed nausea, headache, "tingling" in her lips and hands (bilaterally) and developed chest pressure.  Reports mild SOB.  No syncope.  Now feeling improved.  Has not had these symptoms previously.  Denies h/o CAD.  No h/o DVT/PE.  Not pleuritic pain.  She is a former smoker Denies family h/o CAD  Past Medical History  Diagnosis Date  . Atypical chest pain   . Tobacco abuse   . History of fibromyalgia   . Crohn disease   . Depression   . Anxiety   . Brain aneurysm     Past Surgical History  Procedure Date  . Arm surgery     left   . Shoulder surgery     left  . Abdominal hysterectomy     Family History  Problem Relation Age of Onset  . Diabetes Mother   . Diabetes Brother     History  Substance Use Topics  . Smoking status: Current Some Day Smoker -- 1.0 packs/day    Types: Cigarettes  . Smokeless tobacco: Not on file   Comment: one pack/ 3 days  . Alcohol Use: No    OB History    Grav Para Term Preterm Abortions TAB SAB Ect Mult Living                  Review of Systems  Constitutional:  Positive for diaphoresis. Negative for fever.  Cardiovascular: Positive for chest pain. Negative for syncope.  Gastrointestinal: Positive for nausea. Negative for vomiting and abdominal pain.  All other systems reviewed and are negative.    Allergies  Ciprofloxacin; Meperidine hcl; Promethazine hcl; and Propoxyphene-acetaminophen  Home Medications   Current Outpatient Rx  Name Route Sig Dispense Refill  . ASPIRIN 81 MG PO TBEC Oral Take 81 mg by mouth daily.      Marland Kitchen [place] COHOSH PO Oral Take 1 tablet by mouth daily.    Marland Kitchen VITAMIN D3 1000 UNITS PO TABS Oral Take 1,000 Units by mouth daily.      . CYANOCOBALAMIN 500 MCG PO TABS Oral Take 500 mcg by mouth daily.    . OMEGA-3 FATTY ACIDS 1000 MG PO CAPS Oral Take 1 capsule by mouth daily.      Marland Kitchen LORAZEPAM 0.5 MG PO TABS Oral Take 0.5 mg by mouth daily as needed. For anxiety    . MAGNESIUM 300 MG PO CAPS Oral Take 1 capsule by mouth daily.      Marland Kitchen MESALAMINE ER 0.375 G PO CP24 Oral Take 375 mg by mouth daily.    . ADULT MULTIVITAMIN W/MINERALS CH Oral  Take 1 tablet by mouth daily.    . OCUVITE-LUTEIN PO CAPS Oral Take 1 capsule by mouth daily.    Marland Kitchen POLYETHYL GLYCOL-PROPYL GLYCOL 0.4-0.3 % OP SOLN Ophthalmic Apply 1 drop to eye 4 (four) times daily as needed. For dry eyes      BP 112/58  Pulse 64  Temp 98.6 F (37 C) (Oral)  Resp 16  SpO2 100%  Physical Exam CONSTITUTIONAL: Well developed/well nourished HEAD AND FACE: Normocephalic/atraumatic EYES: EOMI/PERRL ENMT: Mucous membranes moist NECK: supple no meningeal signs SPINE:entire spine nontender CV: S1/S2 noted, no murmurs/rubs/gallops noted LUNGS: Lungs are clear to auscultation bilaterally, no apparent distress Chest - tender to palpation ABDOMEN: soft, nontender, no rebound or guarding GU:no cva tenderness NEURO: Pt is awake/alert, moves all extremitiesx4 EXTREMITIES: pulses normal, full ROM, no edema SKIN: warm, color normal PSYCH: no abnormalities of mood noted   ED  Course  Procedures   Labs Reviewed  CBC  DIFFERENTIAL  BASIC METABOLIC PANEL   Dg Chest 2 View  05/05/2012  *RADIOLOGY REPORT*  Clinical Data: 61 year old female with chest pain.  CHEST - 2 VIEW  Comparison: 11/13/2011 chest radiograph  Findings: The cardiomediastinal silhouette is unremarkable. The lungs are clear. There is no evidence of focal airspace disease, pulmonary edema, suspicious pulmonary nodule/mass, pleural effusion, or pneumothorax. No acute bony abnormalities are identified.  IMPRESSION: No evidence of active cardiopulmonary disease.  Original Report Authenticated By: Lura Em, M.D.    HEART score low and very atypical CP story Will check troponin 3 hrs apart and reassess 7:57 PM Pt feels improved Reports pain only with movement in bed at this time Will repeat ekg/troponin at 9pm.  If negative will be safe for d/c as acute cardiac less likely 9:41 PM Repeat troponin negative but pt is feeling lightheaded and felt dizzy upon standing.  CP is similar to pain earlier Given the fact that her symptoms have worsened will admit for observation D/w dr Jonelle Sidle will admit and he will place orders Pt stable at this time to be admitted for further workup of Chest pain/monitoring    MDM  Nursing notes including past medical history and social history reviewed and considered in documentation xrays reviewed and considered All labs/vitals reviewed and considered        Date: 05/05/2012  Rate: 65  Rhythm: normal sinus rhythm  QRS Axis: normal  Intervals: normal  ST/T Wave abnormalities: nonspecific ST changes  Conduction Disutrbances:none  Narrative Interpretation:   Old EKG Reviewed: unchanged    Sharyon Cable, MD 05/05/12 2211

## 2012-05-05 NOTE — ED Notes (Signed)
Per ems- pt reporting midsternal cp no radiation since 1500. Pt reports pain is intermittent, denying any sob. Pt reporting initially having numbness in lips and feet. Reporting nausea no vomiting. SR 80. 120/64, 100% RA. 324 aspirin, pt refused nitro. A x 4. 20 in left wrist

## 2012-05-05 NOTE — H&P (Signed)
Stacy Hansen is an 61 y.o. female.   Chief Complaint: Chest pain HPI: A 61 year old female with history of anxiety disorder and tobacco abuse among other things also Crohn's disease and fibromyalgia here with chest pain. Patient apparently was working at [employer] today when she suddenly felt some tingling sensation on her hands as well as her lips. She also had some nausea and headache followed by chest pressure. The pressure was rated as 6/10 and persisted until she came to the emergency room. She declined nitroglycerin but the chest pain is gone now with treatment including Morphine. Her nausea is also better right now. Her initial enzymes showed mildly elevated troponin which normalize. No EKG changes. Patient was being discharged but is not having dizziness and the chest pressure is coming back. She is therefore being admitted for further workup. She denied any radiation of her pain no shortness of breath no cough no hemoptysis no no diarrhea.  Past Medical History  Diagnosis Date  . Atypical chest pain   . Tobacco abuse   . History of fibromyalgia   . Crohn disease   . Depression   . Anxiety   . Brain aneurysm     Past Surgical History  Procedure Date  . Arm surgery     left   . Shoulder surgery     left  . Abdominal hysterectomy     Family History  Problem Relation Age of Onset  . Diabetes Mother   . Diabetes Brother    Social History:  reports that she has been smoking Cigarettes.  She has been smoking about 1 pack per day. She does not have any smokeless tobacco history on file. She reports that she does not drink alcohol or use illicit drugs.  Allergies:  Allergies  Allergen Reactions  . Ciprofloxacin Other (See Comments)    Unknown reaction per pt  . Meperidine Hcl Nausea And Vomiting and Other (See Comments)    Causes blood pressure to drop when taken with Phenergan through IV  . Promethazine Hcl Nausea And Vomiting and Other (See Comments)    Causes blood  pressure to drop when taken with Demeral via IV  . Propoxyphene-Acetaminophen Other (See Comments)    "pass out"     (Not in a hospital admission)  Results for orders placed during the hospital encounter of 05/05/12 (from the past 48 hour(s))  CBC     Status: Normal   Collection Time   05/05/12  5:59 PM      Component Value Range Comment   WBC 6.8  4.0 - 10.5 K/uL    RBC 4.13  3.87 - 5.11 MIL/uL    Hemoglobin 12.5  12.0 - 15.0 g/dL    HCT 37.3  36.0 - 46.0 %    MCV 90.3  78.0 - 100.0 fL    MCH 30.3  26.0 - 34.0 pg    MCHC 33.5  30.0 - 36.0 g/dL    RDW 13.2  11.5 - 15.5 %    Platelets 187  150 - 400 K/uL   DIFFERENTIAL     Status: Normal   Collection Time   05/05/12  5:59 PM      Component Value Range Comment   Neutrophils Relative 60  43 - 77 %    Neutro Abs 4.1  1.7 - 7.7 K/uL    Lymphocytes Relative 32  12 - 46 %    Lymphs Abs 2.2  0.7 - 4.0 K/uL    Monocytes Relative  5  3 - 12 %    Monocytes Absolute 0.4  0.1 - 1.0 K/uL    Eosinophils Relative 2  0 - 5 %    Eosinophils Absolute 0.2  0.0 - 0.7 K/uL    Basophils Relative 1  0 - 1 %    Basophils Absolute 0.0  0.0 - 0.1 K/uL   BASIC METABOLIC PANEL     Status: Abnormal   Collection Time   05/05/12  5:59 PM      Component Value Range Comment   Sodium 138  135 - 145 mEq/L    Potassium 4.4  3.5 - 5.1 mEq/L    Chloride 101  96 - 112 mEq/L    CO2 28  19 - 32 mEq/L    Glucose, Bld 98  70 - 99 mg/dL    BUN 12  6 - 23 mg/dL    Creatinine, Ser 0.81  0.50 - 1.10 mg/dL    Calcium 9.4  8.4 - 10.5 mg/dL    GFR calc non Af Amer 77 (*) >90 mL/min    GFR calc Af Amer 90 (*) >90 mL/min   POCT I-STAT TROPONIN I     Status: Normal   Collection Time   05/05/12  6:15 PM      Component Value Range Comment   Troponin i, poc 0.00  0.00 - 0.08 ng/mL    Comment 3            POCT I-STAT TROPONIN I     Status: Normal   Collection Time   05/05/12  9:07 PM      Component Value Range Comment   Troponin i, poc 0.00  0.00 - 0.08 ng/mL     Comment 3             Dg Chest 2 View  05/05/2012  *RADIOLOGY REPORT*  Clinical Data: 61 year old female with chest pain.  CHEST - 2 VIEW  Comparison: 11/13/2011 chest radiograph  Findings: The cardiomediastinal silhouette is unremarkable. The lungs are clear. There is no evidence of focal airspace disease, pulmonary edema, suspicious pulmonary nodule/mass, pleural effusion, or pneumothorax. No acute bony abnormalities are identified.  IMPRESSION: No evidence of active cardiopulmonary disease.  Original Report Authenticated By: Lura Em, M.D.    Review of Systems  Constitutional: Negative.   Eyes: Negative.   Respiratory: Negative.   Cardiovascular: Positive for chest pain.  Gastrointestinal: Positive for nausea.  Genitourinary: Negative.   Musculoskeletal: Negative.   Skin: Negative.   Neurological: Positive for tingling and headaches.  Endo/Heme/Allergies: Negative.   Psychiatric/Behavioral: Negative.     Blood pressure 110/45, pulse 73, temperature 97.5 F (36.4 C), temperature source Oral, resp. rate 14, SpO2 98.00%. Physical Exam  Constitutional: She is oriented to person, place, and time. She appears well-developed and well-nourished.  HENT:  Head: Normocephalic and atraumatic.  Right Ear: External ear normal.  Left Ear: External ear normal.  Nose: Nose normal.  Mouth/Throat: Oropharynx is clear and moist.  Eyes: Conjunctivae and EOM are normal. Pupils are equal, round, and reactive to light.  Neck: Normal range of motion. Neck supple.  Cardiovascular: Normal rate, regular rhythm, normal heart sounds and intact distal pulses.   Respiratory: Effort normal and breath sounds normal.  GI: Soft. Bowel sounds are normal.  Musculoskeletal: Normal range of motion.  Neurological: She is alert and oriented to person, place, and time. She has normal reflexes.  Skin: Skin is warm and dry.  Psychiatric: She has a normal  mood and affect. Her behavior is normal. Judgment and thought  content normal.     Assessment/Plan 61 year old female presenting with chest pain and dizziness. She has risk factors for coronary artery disease including tobacco use and limited family history. Patient will be admitted for observation and MI rule out. Plan #1 chest pain: Patient will be admitted for observation. Check serial cardiac enzymes. Consider echocardiogram as an outpatient. If enzymes are all negative she'll be discharged in the morning. He'll have followup as an outpatient with cardiology or primary care physician. #2 dizziness: Most likely dehydration and exhaustion. It could also be related to cardiac disease. No evidence of arrhythmia on telemetry or the 12-lead EKG. We'll hydrate her and get PT OT to see her in the morning if she is still dizzy. #3 anxiety disorder: Patient's symptoms could all be secondary to panic attacks since she has baseline history of anxiety disorder. We'll continue with anxiolytics as per home medications and monitor her in the hospital. #4 Crohn's disease: This seems to be stable. #5 tobacco abuse: Patient has been counseled on tobacco cessation. She's also been offered nicotine patch if needed in the hospital. #6 fibromyalgia: This could also be a cause for her symptoms. Her pain however is only limited to one side which is her chest at this point. She agreed that she has pain all the time in one place or the other due to the fibromyalgia. We will therefore take this into account while evaluating and treating patient in the hospital.  [facility] 05/05/2012, 10:44 PM

## 2012-05-05 NOTE — ED Notes (Signed)
Patient transported to X-ray 

## 2012-05-05 NOTE — ED Notes (Signed)
EKG RESULT GIVEN TO DR. Christy Gentles .

## 2012-05-06 ENCOUNTER — Encounter (HOSPITAL_COMMUNITY): Payer: Self-pay | Admitting: *Deleted

## 2012-05-06 DIAGNOSIS — R42 Dizziness and giddiness: Secondary | ICD-10-CM

## 2012-05-06 DIAGNOSIS — F411 Generalized anxiety disorder: Secondary | ICD-10-CM

## 2012-05-06 DIAGNOSIS — F17200 Nicotine dependence, unspecified, uncomplicated: Secondary | ICD-10-CM

## 2012-05-06 DIAGNOSIS — R072 Precordial pain: Secondary | ICD-10-CM

## 2012-05-06 LAB — CBC
HCT: 36.1 % (ref 36.0–46.0)
Hemoglobin: 12.1 g/dL (ref 12.0–15.0)
Platelets: 193 10*3/uL (ref 150–400)
RBC: 4.17 MIL/uL (ref 3.87–5.11)
RDW: 13.4 % (ref 11.5–15.5)
WBC: 10.9 10*3/uL — ABNORMAL HIGH (ref 4.0–10.5)
WBC: 6.1 10*3/uL (ref 4.0–10.5)

## 2012-05-06 LAB — HEPATIC FUNCTION PANEL
ALT: 10 U/L (ref 0–35)
Bilirubin, Direct: <0.1 mg/dL (ref 0.0–0.3)
Total Bilirubin: 0.3 mg/dL (ref 0.3–1.2)

## 2012-05-06 LAB — BASIC METABOLIC PANEL
Chloride: 108 mEq/L (ref 96–112)
GFR calc Af Amer: >90 mL/min (ref >90)
Potassium: 3.8 mEq/L (ref 3.5–5.1)

## 2012-05-06 LAB — CARDIAC PANEL(CRET KIN+CKTOT+MB+TROPI)
CK, MB: 2.4 ng/mL (ref 0.3–4.0)
Relative Index: INVALID (ref 0.0–2.5)
Total CK: 86 U/L (ref 7–177)
Troponin I: <0.3 ng/mL (ref <0.30)
Troponin I: <0.3 ng/mL (ref <0.30)

## 2012-05-06 LAB — TSH
TSH: 1.905 u[IU]/mL (ref 0.350–4.500)
TSH: 1.456 u[IU]/mL (ref 0.350–4.500)

## 2012-05-06 LAB — CREATININE, SERUM
Creatinine, Ser: 0.76 mg/dL (ref 0.50–1.10)
GFR calc Af Amer: >90 mL/min (ref >90)
GFR calc non Af Amer: 90 mL/min — ABNORMAL LOW (ref >90)

## 2012-05-06 LAB — D-DIMER, QUANTITATIVE: D-Dimer, Quant: 0.23 ug/mL-FEU (ref 0.00–0.48)

## 2012-05-06 MED ORDER — LORAZEPAM 0.5 MG PO TABS: 0.5000 mg | ORAL_TABLET | Freq: Every day | ORAL | Status: DC | PRN

## 2012-05-06 NOTE — Discharge Summary (Addendum)
Physician Discharge Summary  Stacy Hansen MRN: [id number] DOB/AGE: 03-10-1951 61 y.o.  PCP: No primary provider on file.   Admit date: 05/05/2012 Discharge date: 05/06/2012  Discharge Diagnoses:  Viral syndrome  *Precordial pain Nicotine dependence  Anxiety state, unspecified  TOBACCO ABUSE  Dizziness   Medication List  As of 05/06/2012 12:24 PM   STOP taking these medications         Weng COHOSH PO         TAKE these medications         aspirin 81 MG EC tablet   Take 81 mg by mouth daily.      cholecalciferol 1000 UNITS tablet   Commonly known as: VITAMIN D   Take 1,000 Units by mouth daily.      cyanocobalamin 500 MCG tablet   Take 500 mcg by mouth daily.      fish oil-omega-3 fatty acids 1000 MG capsule   Take 1 capsule by mouth daily.      LORazepam 0.5 MG tablet   Commonly known as: ATIVAN   Take 1 tablet (0.5 mg total) by mouth daily as needed. For anxiety      Magnesium 300 MG Caps   Take 1 capsule by mouth daily.      mesalamine 0.375 G 24 hr capsule   Commonly known as: APRISO   Take 375 mg by mouth daily.      multivitamin with minerals Tabs   Take 1 tablet by mouth daily.      multivitamin-lutein Caps   Take 1 capsule by mouth daily.      SYSTANE 0.4-0.3 % Soln   Generic drug: Polyethyl Glycol-Propyl Glycol   Apply 1 drop to eye 4 (four) times daily as needed. For dry eyes            Discharge Condition: Stable Disposition: 01-Home or Self Care   Consults: None  Significant Diagnostic Studies: Dg Chest 2 View  05/05/2012  *RADIOLOGY REPORT*  Clinical Data: 61 year old female with chest pain.  CHEST - 2 VIEW  Comparison: 11/13/2011 chest radiograph  Findings: The cardiomediastinal silhouette is unremarkable. The lungs are clear. There is no evidence of focal airspace disease, pulmonary edema, suspicious pulmonary nodule/mass, pleural effusion, or pneumothorax. No acute bony abnormalities are identified.  IMPRESSION: No  evidence of active cardiopulmonary disease.  Original Report Authenticated By: Lura Em, M.D.   2-D echo is pending at this time  Microbiology: No results found for this or any previous visit (from the past 240 hour(s)).   Labs: Results for orders placed during the hospital encounter of 05/05/12 (from the past 48 hour(s))  CBC     Status: Normal   Collection Time   05/05/12  5:59 PM      Component Value Range Comment   WBC 6.8  4.0 - 10.5 K/uL    RBC 4.13  3.87 - 5.11 MIL/uL    Hemoglobin 12.5  12.0 - 15.0 g/dL    HCT 37.3  36.0 - 46.0 %    MCV 90.3  78.0 - 100.0 fL    MCH 30.3  26.0 - 34.0 pg    MCHC 33.5  30.0 - 36.0 g/dL    RDW 13.2  11.5 - 15.5 %    Platelets 187  150 - 400 K/uL   DIFFERENTIAL     Status: Normal   Collection Time   05/05/12  5:59 PM      Component Value Range Comment  Neutrophils Relative 60  43 - 77 %    Neutro Abs 4.1  1.7 - 7.7 K/uL    Lymphocytes Relative 32  12 - 46 %    Lymphs Abs 2.2  0.7 - 4.0 K/uL    Monocytes Relative 5  3 - 12 %    Monocytes Absolute 0.4  0.1 - 1.0 K/uL    Eosinophils Relative 2  0 - 5 %    Eosinophils Absolute 0.2  0.0 - 0.7 K/uL    Basophils Relative 1  0 - 1 %    Basophils Absolute 0.0  0.0 - 0.1 K/uL   BASIC METABOLIC PANEL     Status: Abnormal   Collection Time   05/05/12  5:59 PM      Component Value Range Comment   Sodium 138  135 - 145 mEq/L    Potassium 4.4  3.5 - 5.1 mEq/L    Chloride 101  96 - 112 mEq/L    CO2 28  19 - 32 mEq/L    Glucose, Bld 98  70 - 99 mg/dL    BUN 12  6 - 23 mg/dL    Creatinine, Ser 0.81  0.50 - 1.10 mg/dL    Calcium 9.4  8.4 - 10.5 mg/dL    GFR calc non Af Amer 77 (*) >90 mL/min    GFR calc Af Amer 90 (*) >90 mL/min   POCT I-STAT TROPONIN I     Status: Normal   Collection Time   05/05/12  6:15 PM      Component Value Range Comment   Troponin i, poc 0.00  0.00 - 0.08 ng/mL    Comment 3            POCT I-STAT TROPONIN I     Status: Normal   Collection Time   05/05/12  9:07 PM       Component Value Range Comment   Troponin i, poc 0.00  0.00 - 0.08 ng/mL    Comment 3            CBC     Status: Abnormal   Collection Time   05/05/12 11:47 PM      Component Value Range Comment   WBC 10.9 (*) 4.0 - 10.5 K/uL    RBC 4.17  3.87 - 5.11 MIL/uL    Hemoglobin 12.9  12.0 - 15.0 g/dL    HCT 38.2  36.0 - 46.0 %    MCV 91.6  78.0 - 100.0 fL    MCH 30.9  26.0 - 34.0 pg    MCHC 33.8  30.0 - 36.0 g/dL    RDW 13.4  11.5 - 15.5 %    Platelets 193  150 - 400 K/uL   CREATININE, SERUM     Status: Abnormal   Collection Time   05/05/12 11:47 PM      Component Value Range Comment   Creatinine, Ser 0.76  0.50 - 1.10 mg/dL    GFR calc non Af Amer 90 (*) >90 mL/min    GFR calc Af Amer >90  >90 mL/min   CARDIAC PANEL(CRET KIN+CKTOT+MB+TROPI)     Status: Normal   Collection Time   05/05/12 11:47 PM      Component Value Range Comment   Total CK 105  7 - 177 U/L    CK, MB 2.5  0.3 - 4.0 ng/mL    Troponin I <0.30  <0.30 ng/mL    Relative Index 2.4  0.0 - 2.5  BASIC METABOLIC PANEL     Status: Normal   Collection Time   05/06/12  5:00 AM      Component Value Range Comment   Sodium 142  135 - 145 mEq/L    Potassium 3.8  3.5 - 5.1 mEq/L    Chloride 108  96 - 112 mEq/L    CO2 26  19 - 32 mEq/L    Glucose, Bld 97  70 - 99 mg/dL    BUN 8  6 - 23 mg/dL    Creatinine, Ser 0.69  0.50 - 1.10 mg/dL    Calcium 8.5  8.4 - 10.5 mg/dL    GFR calc non Af Amer >90  >90 mL/min    GFR calc Af Amer >90  >90 mL/min   CBC     Status: Abnormal   Collection Time   05/06/12  5:00 AM      Component Value Range Comment   WBC 6.1  4.0 - 10.5 K/uL    RBC 3.95  3.87 - 5.11 MIL/uL    Hemoglobin 12.1  12.0 - 15.0 g/dL    HCT 36.1  36.0 - 46.0 %    MCV 91.4  78.0 - 100.0 fL    MCH 30.6  26.0 - 34.0 pg    MCHC 33.5  30.0 - 36.0 g/dL    RDW 13.4  11.5 - 15.5 %    Platelets 142 (*) 150 - 400 K/uL DELTA CHECK NOTED  CARDIAC PANEL(CRET KIN+CKTOT+MB+TROPI)     Status: Normal (Preliminary result)   Collection  Time   05/06/12  9:30 AM      Component Value Range Comment   Total CK PENDING  7 - 177 U/L    CK, MB 2.1  0.3 - 4.0 ng/mL    Troponin I <0.30  <0.30 ng/mL    Relative Index PENDING  0.0 - 2.5   D-DIMER, QUANTITATIVE     Status: Normal   Collection Time   05/06/12  9:30 AM      Component Value Range Comment   D-Dimer, Quant 0.23  0.00 - 0.48 ug/mL-FEU      HPI :61 year old female with history of anxiety disorder and tobacco abuse among other things also Crohn's disease and fibromyalgia here with chest pain, nausea, myalagias . Patient apparently was working at [employer] today when she suddenly felt some tingling sensation on her hands as well as her lips. She also had some nausea and headache followed by chest pressure. The pressure was rated as 6/10 and persisted until she came to the emergency room. She declined nitroglycerin but the chest pain is gone now with treatment including Morphine. Her nausea is also better right now. Her initial enzymes showed mildly elevated troponin which normalize. No EKG changes. Patient was being discharged started having dizziness and the chest pressure and was therefore admitted,    HOSPITAL COURSE:  #1 nausea associated with dry heaving symptoms not related to eating. Patient denies abdominal pain any diarrhea. She denies any history of gallstones. She does complain of some epigastric discomfort radiating into her chest. There some concern for gallstones and right upper quadrant ultrasound has been ordered which is pending at this time. If negative the patient can be discharged home.  #2 chest pain the patient states that she has not had any exertional chest pain or dyspnea, she and her husband were moving some boxes couple of days ago, and she may have pulled a muscle. His last component of anxiety. The  patient was counseled about smoking cessation. Cardiac enzymes were cycled and were found to be negative. EKG was normal. D-dimer was also done and was found to  be negative. Chest x-ray did not show any other etiology including pneumonia. A 2-D echo is being done if negative the patient will be discharged home today   Discharge Exam:   Blood pressure 98/55, pulse 65, temperature 98.2 F (36.8 C), temperature source Oral, resp. rate 18, height 5' 3"  (1.6 m), weight 54.1 kg (119 lb 4.3 oz), SpO2 98.00%.  Head: Normocephalic and atraumatic.  Right Ear: External ear normal.  Left Ear: External ear normal.  Nose: Nose normal.  Mouth/Throat: Oropharynx is clear and moist.  Eyes: Conjunctivae and EOM are normal. Pupils are equal, round, and reactive to light.  Neck: Normal range of motion. Neck supple.  Cardiovascular: Normal rate, regular rhythm, normal heart sounds and intact distal pulses.  Respiratory: Effort normal and breath sounds normal.  GI: Soft. Bowel sounds are normal.  Musculoskeletal: Normal range of motion.  Neurological: She is alert and oriented to person, place, and time. She has normal reflexes.  Skin: Skin is warm and dry.  Psychiatric: She has a normal mood and affect. Her behavior is normal. Judgment and thought content normal.       Addendum Patient kept waiting for a RUQ until 3 pm, received multiple requests from the patient to be allowed to eat . She decided not to wait for the USG , and was allowed to eat dinner . In the meantime patient's LFT's and lipase results came back normal. Nausea resolved and patient eat dinner without any subsequent nausea / vomitting   Explained to the patient that an USG was not needed urgently . She could go home and get an outpt  USG. Patient very upset that she was being discharged without USG. Tried to reassure but patient still was upset   LV EF: 55%  ------------------------------------------------------------ Indications: Chest pain 786.51.  ------------------------------------------------------------ History: Risk factors: Anxiety. Dizziness. Visual impairment. Crohn's disease.  Fibromyalgia. Palpitations. Current tobacco use.  ------------------------------------------------------------ Study Conclusions  - Left ventricle: The cavity size was normal. Wall thickness was normal. The estimated ejection fraction was 55%. Wall motion was normal; there were no regional wall motion abnormalities. Left ventricular diastolic function parameters were normal. - Aortic valve: There was no stenosis. - Mitral valve: No significant regurgitation. - Right ventricle: The cavity size was normal. Systolic function was normal. - Pulmonary arteries: No complete TR doppler jet so unable to estimate PA systolic pressure. - Systemic veins: IVC measured 1.8 cm with normal respirophasic variation, suggesting RA pressure 6-10 mmHg. Impressions:  - Normal study.    SignedReyne Dumas 05/06/2012, 12:24 PM

## 2012-05-06 NOTE — Discharge Instructions (Signed)
Patient can go back to work after seeing PCP  PCP to arrange for RUQ , usg

## 2012-05-06 NOTE — Progress Notes (Signed)
MD updated with pt desire to eat food  since she had been NPO since 0800 for initially a CT scan test that was eventually cancelled. New order was placed for an U/Sound of abdomen for nausea ;vomiting and abdominal pain. [place] tech updated of test as stat. Pt was unable to contain her hunger and Dr. Allyson Sabal updated with new order to discharge pt after she eats her food and does not have episodes of nausea or vomiting and have her primary MD schedule her for an U/S of abdomen . Pt ate her tray before U/S dept could do her test . Also this admission, Family members had noticed a tick on her back with the head burrowed. Had called Dr. Allyson Sabal and updated her with order to remove the tick after verifying that it is not engorged in blood which it wasn't. Per MD the tick was removed with instructions to have her primary MD follow-up on the tick bite. House RN supervisor aware of tick bite and gave ok to remove tick to charge RN. All body parts of tick removed including head and pincers and seen by pt daughter. Tick was taken out and placed on urine specimen container and gave it to family member. Carlynn Spry RN

## 2012-05-06 NOTE — Progress Notes (Signed)
Pt was complaining of being nauseous. Pt had an episode of emesis around 0000. Pt was asked if she wanted anything for nausea and refused. Pt was also complaining of chest pressure that she rates as a 1 but pt refused nitro and any pain meds at this time. Pt was given ginger ale. MD on call notified will continue to monitor the pt. Hoover Brunette

## 2012-05-06 NOTE — Progress Notes (Signed)
  Echocardiogram 2D Echocardiogram has been performed.  Basilia Jumbo 05/06/2012, 3:28 PM

## 2012-05-06 NOTE — Progress Notes (Signed)
Pt agitated and slightly combative at this time. Pt is anxious about her test. Dr. Allyson Sabal updated with order to change U/S to stat. Thanks Carlynn Spry RN

## 2012-05-10 ENCOUNTER — Other Ambulatory Visit: Payer: Self-pay | Admitting: Family Medicine

## 2012-05-10 ENCOUNTER — Ambulatory Visit (HOSPITAL_COMMUNITY)
Admission: RE | Admit: 2012-05-10 | Discharge: 2012-05-10 | Disposition: A | Discharge status: Home / Self Care | Payer: BC Managed Care – PPO | Source: Ambulatory Visit | Attending: Family Medicine | Admitting: Family Medicine

## 2012-05-10 DIAGNOSIS — R1011 Right upper quadrant pain: Secondary | ICD-10-CM

## 2012-05-29 ENCOUNTER — Encounter: Payer: Self-pay | Admitting: Internal Medicine

## 2012-06-29 ENCOUNTER — Ambulatory Visit (INDEPENDENT_AMBULATORY_CARE_PROVIDER_SITE_OTHER): Payer: BC Managed Care – PPO | Admitting: Otolaryngology

## 2012-06-29 DIAGNOSIS — K219 Gastro-esophageal reflux disease without esophagitis: Secondary | ICD-10-CM

## 2012-06-29 DIAGNOSIS — J381 Polyp of vocal cord and larynx: Secondary | ICD-10-CM

## 2012-06-29 DIAGNOSIS — R490 Dysphonia: Secondary | ICD-10-CM

## 2012-07-27 ENCOUNTER — Ambulatory Visit (INDEPENDENT_AMBULATORY_CARE_PROVIDER_SITE_OTHER): Payer: BC Managed Care – PPO | Admitting: Otolaryngology

## 2012-07-27 DIAGNOSIS — H8110 Benign paroxysmal vertigo, unspecified ear: Secondary | ICD-10-CM

## 2012-07-27 DIAGNOSIS — R490 Dysphonia: Secondary | ICD-10-CM

## 2012-08-16 DIAGNOSIS — G4700 Insomnia, unspecified: Secondary | ICD-10-CM | POA: Insufficient documentation

## 2013-01-09 ENCOUNTER — Telehealth: Payer: Self-pay | Admitting: Internal Medicine

## 2013-01-09 NOTE — Telephone Encounter (Signed)
OK, I will see the pt.

## 2013-01-12 NOTE — Telephone Encounter (Signed)
Dr Olevia Perches- You reviewed records on this patient (who has history with Dr Oletta Lamas many years ago. Later came to see Dr Olevia Perches (2005). Saw Dr Stann Mainland around 2010. Has since being seeing Dr Britta Mccreedy (GI) as late as 06/21/2012). On paper records, you declined to accept patient back. On electronic record, you accepted patient. So, do you want to accept her back or not?

## 2013-01-12 NOTE — Telephone Encounter (Signed)
I did not know I already reviewed the chart. I will see her then.

## 2013-01-16 ENCOUNTER — Encounter: Payer: Self-pay | Admitting: Internal Medicine

## 2013-01-16 NOTE — Telephone Encounter (Signed)
Appointment scheduled w/pt for 03-07-13

## 2013-01-17 ENCOUNTER — Encounter: Payer: Self-pay | Admitting: *Deleted

## 2013-03-07 ENCOUNTER — Encounter: Payer: Self-pay | Admitting: Internal Medicine

## 2013-03-07 ENCOUNTER — Other Ambulatory Visit (INDEPENDENT_AMBULATORY_CARE_PROVIDER_SITE_OTHER): Payer: BC Managed Care – PPO

## 2013-03-07 ENCOUNTER — Ambulatory Visit (INDEPENDENT_AMBULATORY_CARE_PROVIDER_SITE_OTHER): Payer: BC Managed Care – PPO | Admitting: Internal Medicine

## 2013-03-07 VITALS — BP 110/70 | HR 80 | Ht 63.0 in | Wt 127.4 lb

## 2013-03-07 DIAGNOSIS — R1031 Right lower quadrant pain: Secondary | ICD-10-CM

## 2013-03-07 DIAGNOSIS — K5090 Crohn's disease, unspecified, without complications: Secondary | ICD-10-CM

## 2013-03-07 DIAGNOSIS — R197 Diarrhea, unspecified: Secondary | ICD-10-CM

## 2013-03-07 LAB — COMPREHENSIVE METABOLIC PANEL
ALT: 11 U/L (ref 0–35)
Albumin: 4.4 g/dL (ref 3.5–5.2)
BUN: 11 mg/dL (ref 6–23)
CO2: 28 mEq/L (ref 19–32)
Calcium: 9.5 mg/dL (ref 8.4–10.5)
Chloride: 104 mEq/L (ref 96–112)
Creatinine, Ser: 0.8 mg/dL (ref 0.4–1.2)
GFR: 79.67 mL/min (ref >60.00)
Potassium: 4.5 mEq/L (ref 3.5–5.1)

## 2013-03-07 MED ORDER — PREDNISONE 10 MG PO TABS: 10.0000 mg | ORAL_TABLET | ORAL | Status: DC

## 2013-03-07 MED ORDER — OMEPRAZOLE 20 MG PO CPDR: 20.0000 mg | DELAYED_RELEASE_CAPSULE | Freq: Every day | ORAL | Status: DC

## 2013-03-07 NOTE — Progress Notes (Signed)
Stacy Hansen [birth year] MRN [id number]  History of Present Illness:  This is a 62 year old white female with a history of inflammatory bowel disease diagnosed in 1997 on a colonoscopy and again on a colonoscopy in 2005 which showed chronic active colitis from the left and right colon. Biopsies from the terminal ileum were normal. She has been complaining of right lower quadrant abdominal pain and diarrhea. I have not seen her for 9 years. She has seen several other gastroenterologists including Dr. Oletta Lamas, Dr. Stann Mainland and Dr. Pleas Patricia where she had a colonoscopy in August 2013. She was on mesalamine. She was found to have multiple sessile polyps, most of them hyperplastic. She has diarrhea and pain with activities as well as postprandially. She has occasional nausea and vomiting. She has to sleep on her left side to keep the pain down. When I saw her in 1997 she was treated with a prednisone taper. Additional medical problems include COPD. She stopped smoking several years ago. An upper abdominal ultrasound in June 2013 showed a normal gallbladder but common bile duct of 2.4 mm. CT scan of the abdomen in April 2010 showed no active process.   Past Medical History  Diagnosis Date  . Atypical chest pain   . Tobacco abuse   . History of fibromyalgia   . Crohn disease   . Depression   . Anxiety   . Brain aneurysm   . Hyperplastic colon polyp   . GERD (gastroesophageal reflux disease)    Past Surgical History  Procedure Laterality Date  . Orif ulnar fracture      left   . Rotator cuff repair      left  . Vaginal hysterectomy      has her ovaries  . Tubal ligation    . Tonsillectomy    . Cataract extraction      bilateral  . Vein removed      right varicose vein    reports that she has been smoking Cigarettes.  She has been smoking about 0.00 packs per day. She does not have any smokeless tobacco history on file. She reports that she does not drink alcohol or use illicit drugs. family  history includes Colon cancer in her brother; Diabetes in her brother, mother, and sister; Kidney failure in her mother; and Stroke in her father. Allergies  Allergen Reactions  . Ciprofloxacin Other (See Comments)    Unknown reaction per pt  . Meperidine Hcl Nausea And Vomiting and Other (See Comments)    Causes blood pressure to drop when taken with Phenergan through IV  . Promethazine Hcl Nausea And Vomiting and Other (See Comments)    Causes blood pressure to drop when taken with Demeral via IV  . Propoxyphene-Acetaminophen Other (See Comments)    "pass out"        Review of Systems: Occasional reflux, food regurgitation and heartburn  The remainder of the 10 point ROS is negative except as outlined in H&P   Physical Exam: General appearance  Well developed, in no distress. Eyes- non icteric. HEENT nontraumatic, normocephalic. Mouth no lesions, tongue papillated, no cheilosis. Neck supple without adenopathy, thyroid not enlarged, no carotid bruits, no JVD. Lungs Clear to auscultation bilaterally. Cor normal S1, normal S2, regular rhythm, no murmur,  quiet precordium. Abdomen: Soft but very tender in right lower quadrant without rebound. No palpable mass. Bowel sounds are normal active or slightly hyperactive. Left lower quadrant unremarkable. Rectal: Normal perianal area. Soft Hemoccult negative stool. Extremities no pedal edema.  Skin no lesions. Maculopapular rash on her back Neurological alert and oriented x 3. Psychological normal mood and affect.  Assessment and Plan:  Problem #34 62 year old white female with right lower quadrant abdominal pain and history of Crohn's disease. Prior biopsies from the colon in 1997 and 2005 showed low grade colitis. She responded to steroids. This time we will start prednisone 30 mg daily for 2 weeks and taper her down to 20 mg then 15 mg, then 10 mg and 5 mg every 2 weeks. I will see her in 6 weeks. Today we will obtain IBD markers,  sedimentation rate, CMET, CBC.  Problem #2 Gastroesophageal reflux with heartburn and nausea. We need to rule out a low-grade small bowel obstruction. We will start her on Prilosec 20 mg daily.   03/07/2013 Delfin Edis

## 2013-03-07 NOTE — Patient Instructions (Addendum)
Your physician has requested that you go to the basement for the following lab work before leaving today: CMET, Sed Rate, IBD markers  We have sent the following medications to your pharmacy for you to pick up at your convenience: Omeprazole 20 mg daily.  Dr Olevia Perches has advised that you be on a prednisone taper. The taper instructions are as follows:  Prednisone 30 mg (3 tablets) daily x 2 weeks. Then,  Prednisone 20 mg (2 tablets) daily x 2 weeks. Then, Prednisone 15 mg (1.5 tablets) daily x 2 weeks. Then,  Prednisone 10 mg (1 tablet) daily x 2 weeks. Then,  Prednisone 5 mg (0.5 tablet) daily x 2 weeks.  Please follow up with Dr Olevia Perches in 6 weeks.  You have been scheduled for a CT scan of the abdomen and pelvis at [facility] Radiology Department.  You are scheduled on Wednesday 03/14/13 at 9:30 am. You should arrive 15 minutes prior to your appointment time for registration. Please follow the written instructions below on the day of your exam:  WARNING: IF YOU ARE ALLERGIC TO IODINE/X-RAY DYE, PLEASE NOTIFY us IMMEDIATELY! YOU WILL BE GIVEN A 13 HOUR PREMEDICATION PREP.  1) Do not eat or drink anything after 5:30 am (4 hours prior to your test) 2) You have been given 2 bottles of oral contrast to drink. The solution may taste better if refrigerated, but do NOT add ice or any other liquid to this solution. Shake well before drinking.    Drink 1 bottle of contrast @ 7:30 am (2 hours prior to your exam)  Drink 1 bottle of contrast @ 8:30 am (1 hour prior to your exam)  You may take any medications as prescribed with a small amount of water except for the following: Metformin, Glucophage, Glucovance, Avandamet, Riomet, Fortamet, Actoplus Met, Janumet, Glumetza or Metaglip. The above medications must be held the day of the exam AND 48 hours after the exam.  The purpose of you drinking the oral contrast is to aid in the visualization of your intestinal tract. The contrast solution may  cause some diarrhea. Before your exam is started, you will be given a small amount of fluid to drink. Depending on your individual set of symptoms, you may also receive an intravenous injection of x-ray contrast/dye. This test typically takes 30-45 minutes to complete.  _______________________________________________________________________ CC: Dr Redge Gainer

## 2013-03-09 ENCOUNTER — Other Ambulatory Visit (HOSPITAL_COMMUNITY): Payer: BC Managed Care – PPO

## 2013-03-13 LAB — IBD EXPANDED PANEL
ACCA: 17 units (ref 0–90)
Atypical pANCA: NEGATIVE

## 2013-03-14 ENCOUNTER — Ambulatory Visit (HOSPITAL_COMMUNITY)
Admission: RE | Admit: 2013-03-14 | Discharge: 2013-03-14 | Disposition: A | Discharge status: Home / Self Care | Payer: BC Managed Care – PPO | Source: Ambulatory Visit | Attending: Internal Medicine | Admitting: Internal Medicine

## 2013-03-14 DIAGNOSIS — K5090 Crohn's disease, unspecified, without complications: Secondary | ICD-10-CM

## 2013-03-14 DIAGNOSIS — R1031 Right lower quadrant pain: Secondary | ICD-10-CM | POA: Insufficient documentation

## 2013-03-14 MED ORDER — SODIUM CHLORIDE 0.9 % IJ SOLN
INTRAMUSCULAR | Status: AC
  Filled 2013-03-14: qty 60

## 2013-03-14 MED ORDER — IOHEXOL 300 MG/ML  SOLN
100.0000 mL | Freq: Once | INTRAMUSCULAR | Status: AC | PRN
  Administered 2013-03-14: 100 mL via INTRAVENOUS

## 2013-04-20 ENCOUNTER — Ambulatory Visit: Payer: BC Managed Care – PPO | Admitting: Internal Medicine

## 2013-07-05 ENCOUNTER — Ambulatory Visit (INDEPENDENT_AMBULATORY_CARE_PROVIDER_SITE_OTHER): Payer: BC Managed Care – PPO | Admitting: Internal Medicine

## 2013-07-05 ENCOUNTER — Encounter: Payer: Self-pay | Admitting: Internal Medicine

## 2013-07-05 VITALS — BP 92/68 | HR 80 | Ht 63.0 in | Wt 121.1 lb

## 2013-07-05 DIAGNOSIS — K5289 Other specified noninfective gastroenteritis and colitis: Secondary | ICD-10-CM

## 2013-07-05 DIAGNOSIS — K5100 Ulcerative (chronic) pancolitis without complications: Secondary | ICD-10-CM

## 2013-07-05 DIAGNOSIS — K219 Gastro-esophageal reflux disease without esophagitis: Secondary | ICD-10-CM

## 2013-07-05 MED ORDER — DICYCLOMINE HCL 10 MG PO CAPS: 10.0000 mg | ORAL_CAPSULE | Freq: Two times a day (BID) | ORAL | Status: DC

## 2013-07-05 MED ORDER — OMEPRAZOLE 20 MG PO CPDR: 20.0000 mg | DELAYED_RELEASE_CAPSULE | Freq: Every day | ORAL | Status: DC

## 2013-07-05 MED ORDER — ONDANSETRON HCL 4 MG PO TABS: 4.0000 mg | ORAL_TABLET | Freq: Three times a day (TID) | ORAL | Status: DC | PRN

## 2013-07-05 MED ORDER — MESALAMINE 1.2 G PO TBEC: 3.6000 g | DELAYED_RELEASE_TABLET | Freq: Every day | ORAL | Status: DC

## 2013-07-05 NOTE — Patient Instructions (Addendum)
You have been scheduled for an endoscopy with propofol. Please follow written instructions given to you at your visit today. If you use inhalers (even only as needed), please bring them with you on the day of your procedure. Your physician has requested that you go to www.startemmi.com and enter the access code given to you at your visit today. This web site gives a general overview about your procedure. However, you should still follow specific instructions given to you by our office regarding your preparation for the procedure.  We have sent the following medications to your pharmacy for you to pick up at your convenience: Zofran 4 mg-Take 1 tablet every 8 hours as needed for nausea Prilosec 20 mg daily. Bentyl 10 mg twice daily. Lialda- Take 3 tablets by mouth every morning.  CC: Dr Vicenta Aly

## 2013-07-05 NOTE — Progress Notes (Signed)
Stacy Hansen 15-Apr-1951 MRN [id number]   History of Present Illness:  This is a 62 year old white female with inflammatory bowel disease since 1997 who has seen several different gastroenterologists over the years. Most recently, I saw her in April 2014. A colonoscopy in June 2013 showed chronic active colitis in the left colon and right colon with normal biopsies from the terminal ileum. She also had a hyperplastic polyp. She is now complaining of nausea as well as some crampy abdominal pain and occasional diarrhea. She is not taking any medications for her colitis. She ran out of Prilosec. She started smoking again. Her upper abdominal ultrasound in June 2013 was normal and a CT scan of the abdomen in April 2010 was also normal.   Past Medical History  Diagnosis Date  . Atypical chest pain   . Tobacco abuse   . History of fibromyalgia   . Crohn disease   . Depression   . Anxiety   . Brain aneurysm   . Hyperplastic colon polyp   . GERD (gastroesophageal reflux disease)    Past Surgical History  Procedure Laterality Date  . Orif ulnar fracture      left   . Rotator cuff repair      left  . Vaginal hysterectomy      has her ovaries  . Tubal ligation    . Tonsillectomy    . Cataract extraction      bilateral  . Vein removed      right varicose vein    reports that she has been smoking Cigarettes.  She has been smoking about 0.00 packs per day. She has never used smokeless tobacco. She reports that she does not drink alcohol or use illicit drugs. family history includes Colon cancer in her brother; Diabetes in her brother, mother, and sister; Kidney failure in her mother; Stroke in her father. Allergies  Allergen Reactions  . Ciprofloxacin Other (See Comments)    Unknown reaction per pt  . Meperidine Hcl Nausea And Vomiting and Other (See Comments)    Causes blood pressure to drop when taken with Phenergan through IV  . Promethazine Hcl Nausea And Vomiting and Other (See  Comments)    Causes blood pressure to drop when taken with Demeral via IV  . Propoxyphene-Acetaminophen Other (See Comments)    "pass out"        Review of Systems: Denies dysphagia fever weight loss rectal bleeding  The remainder of the 10 point ROS is negative except as outlined in H&P   Physical Exam: General appearance  Well developed, in no distress. Eyes- non icteric. HEENT nontraumatic, normocephalic. Mouth no lesions, tongue papillated, no cheilosis. Neck supple without adenopathy, thyroid not enlarged, no carotid bruits, no JVD. Lungs Clear to auscultation bilaterally. Cor normal S1, normal S2, regular rhythm, no murmur,  quiet precordium. Abdomen: Soft diffusely tender in right lower quadrant, left lower quadrant and epigastrium. Bowel sounds normoactive. There is no tympany or distention. Liver edge is at the costal margin. Rectal: Extremities no pedal edema. Skin no lesions. Neurological alert and oriented x 3. Psychological normal mood and affect.  Assessment and Plan:  Problem #56 62 year old white female with chronic active colitis who has not had continuous GI care due to switching physicians. in past 15 years. She  Is having a flare up. We will start her on mesalamine 3.6 g today and give her Zofran 4 mg to take when necessary for nausea. We will refill her Prilosec 20 mg a  day and schedule her for an upper endoscopy for her nausea and upper abdominal pain. She will take Bentyl 10 mg twice a day for crampy abdominal pain. She completed a prednisone taper earlier in the spring and had some improvement so she may need to be put on immunomodulators such as Imuran or biologicals if her symptoms don't improve on mesalamine.   07/05/2013 Delfin Edis

## 2013-07-10 ENCOUNTER — Inpatient Hospital Stay (HOSPITAL_COMMUNITY): Payer: BC Managed Care – PPO

## 2013-07-10 ENCOUNTER — Encounter (HOSPITAL_COMMUNITY): Payer: Self-pay | Admitting: *Deleted

## 2013-07-10 ENCOUNTER — Inpatient Hospital Stay (HOSPITAL_COMMUNITY)
Admission: EM | Admit: 2013-07-10 | Discharge: 2013-07-12 | DRG: 247 | Disposition: A | Discharge status: Home / Self Care | Payer: BC Managed Care – PPO | Attending: Internal Medicine | Admitting: Internal Medicine

## 2013-07-10 ENCOUNTER — Emergency Department (HOSPITAL_COMMUNITY): Payer: BC Managed Care – PPO

## 2013-07-10 DIAGNOSIS — R42 Dizziness and giddiness: Secondary | ICD-10-CM

## 2013-07-10 DIAGNOSIS — I639 Cerebral infarction, unspecified: Secondary | ICD-10-CM | POA: Diagnosis present

## 2013-07-10 DIAGNOSIS — F410 Panic disorder [episodic paroxysmal anxiety] without agoraphobia: Secondary | ICD-10-CM | POA: Diagnosis present

## 2013-07-10 DIAGNOSIS — IMO0001 Reserved for inherently not codable concepts without codable children: Secondary | ICD-10-CM | POA: Diagnosis present

## 2013-07-10 DIAGNOSIS — I6350 Cerebral infarction due to unspecified occlusion or stenosis of unspecified cerebral artery: Secondary | ICD-10-CM

## 2013-07-10 DIAGNOSIS — H5310 Unspecified subjective visual disturbances: Secondary | ICD-10-CM

## 2013-07-10 DIAGNOSIS — Z79899 Other long term (current) drug therapy: Secondary | ICD-10-CM

## 2013-07-10 DIAGNOSIS — R29898 Other symptoms and signs involving the musculoskeletal system: Principal | ICD-10-CM | POA: Diagnosis present

## 2013-07-10 DIAGNOSIS — K219 Gastro-esophageal reflux disease without esophagitis: Secondary | ICD-10-CM | POA: Diagnosis present

## 2013-07-10 DIAGNOSIS — K5090 Crohn's disease, unspecified, without complications: Secondary | ICD-10-CM | POA: Diagnosis present

## 2013-07-10 DIAGNOSIS — F411 Generalized anxiety disorder: Secondary | ICD-10-CM | POA: Diagnosis present

## 2013-07-10 DIAGNOSIS — F17200 Nicotine dependence, unspecified, uncomplicated: Secondary | ICD-10-CM | POA: Diagnosis present

## 2013-07-10 DIAGNOSIS — R209 Unspecified disturbances of skin sensation: Secondary | ICD-10-CM | POA: Diagnosis present

## 2013-07-10 HISTORY — DX: Panic disorder (episodic paroxysmal anxiety): F41.0

## 2013-07-10 LAB — CBC WITH DIFFERENTIAL/PLATELET
Basophils Relative: 1 % (ref 0–1)
HCT: 39.4 % (ref 36.0–46.0)
Hemoglobin: 13.2 g/dL (ref 12.0–15.0)
Lymphocytes Relative: 24 % (ref 12–46)
Lymphs Abs: 1.9 10*3/uL (ref 0.7–4.0)
MCHC: 33.5 g/dL (ref 30.0–36.0)
Monocytes Absolute: 0.5 10*3/uL (ref 0.1–1.0)
Monocytes Relative: 6 % (ref 3–12)
Neutro Abs: 5.4 10*3/uL (ref 1.7–7.7)
Neutrophils Relative %: 67 % (ref 43–77)
RBC: 4.3 MIL/uL (ref 3.87–5.11)

## 2013-07-10 LAB — COMPREHENSIVE METABOLIC PANEL
ALT: 8 U/L (ref 0–35)
AST: 24 U/L (ref 0–37)
Albumin: 4 g/dL (ref 3.5–5.2)
Alkaline Phosphatase: 70 U/L (ref 39–117)
BUN: 9 mg/dL (ref 6–23)
Chloride: 101 mEq/L (ref 96–112)
Potassium: 3.7 mEq/L (ref 3.5–5.1)
Sodium: 138 mEq/L (ref 135–145)
Total Bilirubin: 0.2 mg/dL — ABNORMAL LOW (ref 0.3–1.2)
Total Protein: 7.6 g/dL (ref 6.0–8.3)

## 2013-07-10 LAB — APTT: aPTT: 33 seconds (ref 24–37)

## 2013-07-10 LAB — PROTIME-INR
INR: 0.97 (ref 0.00–1.49)
Prothrombin Time: 12.7 seconds (ref 11.6–15.2)

## 2013-07-10 MED ORDER — ONDANSETRON HCL 4 MG PO TABS: 4.0000 mg | ORAL_TABLET | Freq: Three times a day (TID) | ORAL | Status: DC | PRN

## 2013-07-10 MED ORDER — HEPARIN SODIUM (PORCINE) 5000 UNIT/ML IJ SOLN
5000.0000 [IU] | Freq: Three times a day (TID) | INTRAMUSCULAR | Status: DC
  Administered 2013-07-10 – 2013-07-11 (×3): 5000 [IU] via SUBCUTANEOUS
  Filled 2013-07-10 (×4): qty 1

## 2013-07-10 MED ORDER — DICYCLOMINE HCL 10 MG PO CAPS
10.0000 mg | ORAL_CAPSULE | Freq: Two times a day (BID) | ORAL | Status: DC
  Administered 2013-07-10 – 2013-07-12 (×4): 10 mg via ORAL
  Filled 2013-07-10 (×4): qty 1

## 2013-07-10 MED ORDER — ACETAMINOPHEN 500 MG PO TABS: 500.0000 mg | ORAL_TABLET | Freq: Four times a day (QID) | ORAL | Status: DC | PRN

## 2013-07-10 MED ORDER — MESALAMINE 1.2 G PO TBEC
3.6000 g | DELAYED_RELEASE_TABLET | Freq: Every day | ORAL | Status: DC
  Administered 2013-07-11 – 2013-07-12 (×2): 3.6 g via ORAL
  Filled 2013-07-10 (×3): qty 3

## 2013-07-10 MED ORDER — ASPIRIN 81 MG PO CHEW
324.0000 mg | CHEWABLE_TABLET | Freq: Once | ORAL | Status: AC
  Administered 2013-07-10: 324 mg via ORAL
  Filled 2013-07-10: qty 4

## 2013-07-10 MED ORDER — LORAZEPAM 2 MG/ML IJ SOLN
0.5000 mg | INTRAMUSCULAR | Status: DC | PRN
  Administered 2013-07-10: 0.5 mg via INTRAVENOUS
  Filled 2013-07-10: qty 1

## 2013-07-10 MED ORDER — LORAZEPAM 0.5 MG PO TABS: 0.5000 mg | ORAL_TABLET | Freq: Every day | ORAL | Status: DC | PRN

## 2013-07-10 MED ORDER — PANTOPRAZOLE SODIUM 40 MG PO TBEC
40.0000 mg | DELAYED_RELEASE_TABLET | Freq: Every day | ORAL | Status: DC
  Administered 2013-07-10 – 2013-07-12 (×3): 40 mg via ORAL
  Filled 2013-07-10 (×3): qty 1

## 2013-07-10 MED ORDER — ASPIRIN 300 MG RE SUPP
300.0000 mg | Freq: Every day | RECTAL | Status: DC
  Filled 2013-07-10 (×4): qty 1

## 2013-07-10 MED ORDER — ASPIRIN 325 MG PO TABS
325.0000 mg | ORAL_TABLET | Freq: Every day | ORAL | Status: DC
  Administered 2013-07-11 – 2013-07-12 (×2): 325 mg via ORAL
  Filled 2013-07-10 (×2): qty 1

## 2013-07-10 NOTE — ED Notes (Signed)
Pt is claustrophobic and requested medication for anxiety prior to MRI.

## 2013-07-10 NOTE — ED Notes (Signed)
Teleneurology Consult initiated. CT called to send images through and Monitor in place and ready  For consult.

## 2013-07-10 NOTE — ED Notes (Signed)
Pt states that she sees "lighting bolts" to left eye and vision blurry in left eye, states had the same happen a month ago but did not see eye doctor

## 2013-07-10 NOTE — H&P (Signed)
[facility] History and Physical  Stacy Hansen HGD:[id number] DOB: 20-Sep-1951 DOA: 07/10/2013   PCP: Vicenta Aly, FNP    Chief Complaint: Left-sided headache, left arm and leg weakness.  HPI: Stacy Hansen is a 62 y.o. female who presents with a 2 hour episode of left-sided sharp headache associated with left arm and left leg weakness and also visual disturbance in the left thigh. She felt that she was unable to see the bottom half of her visual field. She had a similar episode one month ago, this time it lasted only 20 minutes. She has noticed since the episode one month ago that she has been clumsy with the left hand. She is left-handed. She has been dropping things on that side. Also, she has noticed her gait is not quite normal. There was no loss of consciousness with this episode. There was minimal confusion. The headache has resolved but she still feels that there is weakness in the left arm and leg. She is now being admitted for further investigations.   Review of Systems:   Apart from history of present illness, other systems negative.  Past Medical History  Diagnosis Date  . Atypical chest pain   . Tobacco abuse   . History of fibromyalgia   . Crohn disease   . Depression   . Anxiety   . Brain aneurysm   . Hyperplastic colon polyp   . GERD (gastroesophageal reflux disease)   . Panic attacks    Past Surgical History  Procedure Laterality Date  . Orif ulnar fracture      left   . Rotator cuff repair      left  . Vaginal hysterectomy      has her ovaries  . Tubal ligation    . Tonsillectomy    . Cataract extraction      bilateral  . Vein removed      right varicose vein   Social History:  She is married and lives with her husband. She smokes one pack of cigarettes which lasts for approximately 3 days. She does not drink alcohol. She works at [employer]. She is normally fully independent.  Allergies  Allergen Reactions  . Ciprofloxacin Other (See  Comments)    Unknown reaction per pt  . Meperidine Hcl Nausea And Vomiting and Other (See Comments)    Causes blood pressure to drop when taken with Phenergan through IV  . Promethazine Hcl Nausea And Vomiting and Other (See Comments)    Causes blood pressure to drop when taken with Demeral via IV  . Propoxyphene-Acetaminophen Other (See Comments)    "pass out"    Family History  Problem Relation Age of Onset  . Diabetes Mother   . Kidney failure Mother   . Diabetes Brother   . Stroke Father   . Diabetes Sister     x 2  . Colon cancer Brother     in his 43's      Prior to Admission medications   Medication Sig Start Date End Date Taking? Authorizing Provider  acetaminophen (TYLENOL) 500 MG tablet Take 500 mg by mouth every 6 (six) hours as needed for pain.   Yes Historical Provider, MD  LORazepam (ATIVAN) 0.5 MG tablet Take 1 tablet (0.5 mg total) by mouth daily as needed. For anxiety 05/06/12  Yes Reyne Dumas, MD  dicyclomine (BENTYL) 10 MG capsule Take 1 capsule (10 mg total) by mouth 2 (two) times daily. 07/05/13   Lafayette Dragon, MD  mesalamine Doristine Johns)  1.2 G EC tablet Take 3 tablets (3.6 g total) by mouth daily with breakfast. 07/05/13   Lafayette Dragon, MD  omeprazole (PRILOSEC) 20 MG capsule Take 1 capsule (20 mg total) by mouth daily. 07/05/13   Lafayette Dragon, MD  ondansetron (ZOFRAN) 4 MG tablet Take 1 tablet (4 mg total) by mouth every 8 (eight) hours as needed for nausea. 07/05/13   Lafayette Dragon, MD   Physical Exam: Filed Vitals:   07/10/13 1900  BP: 115/61  Pulse:   Temp:   Resp:      General:  She looks systemically well. She is alert and orientated.  Eyes: External ocular movements normal. No obvious visual field defect.  ENT: Unremarkable.  Neck: No lymphadenopathy.  Cardiovascular: Heart sounds are sinus rhythm without any murmurs. There are no carotid bruits.  Respiratory: Lung fields are clear.  Abdomen: Soft, nontender. No hepatosplenomegaly.  Skin:  No rash.  Musculoskeletal: No acute joint abnormalities.  Psychiatric: Appropriate affect.  Neurologic: Alert and orientated. She does have left arm and left leg weakness, 4/5 in strength. There is no facial asymmetry, however there is sensory disturbance on the left upper and lower face.  Labs on Admission:  Basic Metabolic Panel:  Recent Labs Lab 07/10/13 1650  NA 138  K 3.7  CL 101  CO2 27  GLUCOSE 116*  BUN 9  CREATININE 0.71  CALCIUM 9.4   Liver Function Tests:  Recent Labs Lab 07/10/13 1650  AST 24  ALT 8  ALKPHOS 70  BILITOT 0.2*  PROT 7.6  ALBUMIN 4.0     CBC:  Recent Labs Lab 07/10/13 1650  WBC 8.1  NEUTROABS 5.4  HGB 13.2  HCT 39.4  MCV 91.6  PLT 286   Cardiac Enzymes:  Recent Labs Lab 07/10/13 1650  TROPONINI <0.30      Radiological Exams on Admission: Ct Head Wo Contrast  07/10/2013   *RADIOLOGY REPORT*  Clinical Data: Left sided facial droop/weakness.  Left eye twitching.  Difficulty speaking.  Headache.  CT HEAD WITHOUT CONTRAST  Technique:  Contiguous axial images were obtained from the base of the skull through the vertex without contrast.  Comparison: 03/11/2005  Findings: The ventricles, cisterns and others CSF spaces are within normal.  There is no mass, mass effect, shift midline structures or acute hemorrhage.  There is no evidence of acute infarction.  Bones and soft tissues are within normal.  IMPRESSION: No acute intracranial findings.   Original Report Authenticated By: Marin Olp, M.D.    EKG: Independently reviewed. Normal sinus rhythm, no acute ST-T wave changes.  Assessment/Plan   1. 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 The episode one month ago with continued weakness on the left arm and clumsiness is somewhat worrying that this might be more than a CVA. 2. Ongoing tobacco abuse. 3. Crohn's disease, stable. 4. Fibromyalgia. 5. Anxiety.  Plan: 1. Admit to telemetry floor. 2. Stroke workup. 3. Neurology  consultation tomorrow morning. Further recommendations will depend on patient's hospital progress.   Code Status: Full code.   Family Communication: Discussed plan with patient and patient's husband at the bedside.   Disposition Plan: Home when medically stable.   Time spent: 60 minutes.  Doree Albee [facility] Pager [phone].  If 7PM-7AM, please contact night-coverage [email or web address] Password [facility] 07/10/2013, 7:44 PM

## 2013-07-10 NOTE — ED Notes (Signed)
Daughter reported L facial weakenss, L eye twitching, confusion and difficutly speaking. Severe headache, since passed, visual blurring R eye.  Called her PMD who told her to come to ER.  Reports nauseated.

## 2013-07-10 NOTE — ED Provider Notes (Signed)
CSN: [id number]     Arrival date & time 07/10/13  1628 History   First MD Initiated Contact with Patient 07/10/13 1634     Chief Complaint  Patient presents with  . Headache   (Consider location/radiation/quality/duration/timing/severity/associated sxs/prior Treatment) Patient is a 62 y.o. female presenting with headaches.  Headache  Pt reports onset of mild headache earlier today which had resolved while at work. Around 1430hrs she noticed some cloudy vision on the left with 'lightning bolts' and a funny feeling in her face. Headache returned afterwards. She did not have any slurred speech or weakness. PCP advised her to come to the ED for evaluation.   Past Medical History  Diagnosis Date  . Atypical chest pain   . Tobacco abuse   . History of fibromyalgia   . Crohn disease   . Depression   . Anxiety   . Brain aneurysm   . Hyperplastic colon polyp   . GERD (gastroesophageal reflux disease)   . Panic attacks    Past Surgical History  Procedure Laterality Date  . Orif ulnar fracture      left   . Rotator cuff repair      left  . Vaginal hysterectomy      has her ovaries  . Tubal ligation    . Tonsillectomy    . Cataract extraction      bilateral  . Vein removed      right varicose vein   Family History  Problem Relation Age of Onset  . Diabetes Mother   . Kidney failure Mother   . Diabetes Brother   . Stroke Father   . Diabetes Sister     x 2  . Colon cancer Brother     in his 26's   History  Substance Use Topics  . Smoking status: Current Every Day Smoker    Types: Cigarettes  . Smokeless tobacco: Never Used     Comment: one pack/ 3 days, quit for a year, Tobacco info given 03/07/13  . Alcohol Use: No   OB History   Grav Para Term Preterm Abortions TAB SAB Ect Mult Living                 Review of Systems  Neurological: Positive for headaches.   All other systems reviewed and are negative except as noted in HPI.   Allergies  Ciprofloxacin;  Meperidine hcl; Promethazine hcl; and Propoxyphene-acetaminophen  Home Medications   Current Outpatient Rx  Name  Route  Sig  Dispense  Refill  . dicyclomine (BENTYL) 10 MG capsule   Oral   Take 1 capsule (10 mg total) by mouth 2 (two) times daily.   60 capsule   1   . LORazepam (ATIVAN) 0.5 MG tablet   Oral   Take 1 tablet (0.5 mg total) by mouth daily as needed. For anxiety   15 tablet   0   . mesalamine (LIALDA) 1.2 G EC tablet   Oral   Take 3 tablets (3.6 g total) by mouth daily with breakfast.   90 tablet   2   . omeprazole (PRILOSEC) 20 MG capsule   Oral   Take 1 capsule (20 mg total) by mouth daily.   30 capsule   1   . ondansetron (ZOFRAN) 4 MG tablet   Oral   Take 1 tablet (4 mg total) by mouth every 8 (eight) hours as needed for nausea.   20 tablet   0    BP 99/54  Pulse 72  Temp(Src) 98 F (36.7 C) (Oral)  Resp 15  SpO2 97% Physical Exam  Nursing note and vitals reviewed. Constitutional: She is oriented to person, place, and time. She appears well-developed and well-nourished.  HENT:  Head: Normocephalic and atraumatic.  Eyes: EOM are normal. Pupils are equal, round, and reactive to light.  Neck: Normal range of motion. Neck supple.  Cardiovascular: Normal rate, normal heart sounds and intact distal pulses.   Pulmonary/Chest: Effort normal and breath sounds normal.  Abdominal: Bowel sounds are normal. She exhibits no distension. There is no tenderness.  Musculoskeletal: Normal range of motion. She exhibits no edema and no tenderness.  Neurological: She is alert and oriented to person, place, and time. She has normal reflexes. No cranial nerve deficit or sensory deficit. Coordination normal.  Mild decreased in strength of the L sided compared to R  Skin: Skin is warm and dry. No rash noted.  Psychiatric: She has a normal mood and affect.    ED Course  Procedures (including critical care time) Labs Review Labs Reviewed  COMPREHENSIVE METABOLIC  PANEL - Abnormal; Notable for the following:    Glucose, Bld 116 (*)    Total Bilirubin 0.2 (*)    All other components within normal limits  CBC WITH DIFFERENTIAL  TROPONIN I  PROTIME-INR  APTT   Imaging Review Ct Head Wo Contrast  07/10/2013   *RADIOLOGY REPORT*  Clinical Data: Left sided facial droop/weakness.  Left eye twitching.  Difficulty speaking.  Headache.  CT HEAD WITHOUT CONTRAST  Technique:  Contiguous axial images were obtained from the base of the skull through the vertex without contrast.  Comparison: 03/11/2005  Findings: The ventricles, cisterns and others CSF spaces are within normal.  There is no mass, mass effect, shift midline structures or acute hemorrhage.  There is no evidence of acute infarction.  Bones and soft tissues are within normal.  IMPRESSION: No acute intracranial findings.   Original Report Authenticated By: Marin Olp, M.D.    MDM   1. Stroke      Date: 07/10/2013  Rate: 68  Rhythm: normal sinus rhythm  QRS Axis: normal  Intervals: normal  ST/T Wave abnormalities: normal  Conduction Disutrbances: none  Narrative Interpretation: unremarkable  Pt with stroke like symptoms vs complex migraine. Code Stroke called and Tele neurology consult obtained. tPA not recommended however they did recommend admission for further evaluation.        Charles B. Karle Starch, MD 07/10/13 9323

## 2013-07-11 ENCOUNTER — Inpatient Hospital Stay (HOSPITAL_COMMUNITY)
Admit: 2013-07-11 | Discharge: 2013-07-11 | Disposition: A | Discharge status: Home / Self Care | Payer: BC Managed Care – PPO | Attending: Neurology | Admitting: Neurology

## 2013-07-11 ENCOUNTER — Observation Stay (HOSPITAL_COMMUNITY): Payer: BC Managed Care – PPO

## 2013-07-11 DIAGNOSIS — I6789 Other cerebrovascular disease: Secondary | ICD-10-CM

## 2013-07-11 DIAGNOSIS — H5310 Unspecified subjective visual disturbances: Secondary | ICD-10-CM

## 2013-07-11 DIAGNOSIS — IMO0001 Reserved for inherently not codable concepts without codable children: Secondary | ICD-10-CM

## 2013-07-11 LAB — HEMOGLOBIN A1C
Hgb A1c MFr Bld: 6.1 % — ABNORMAL HIGH (ref <5.7)
Mean Plasma Glucose: 128 mg/dL — ABNORMAL HIGH (ref <117)

## 2013-07-11 LAB — VITAMIN B12: Vitamin B-12: 256 pg/mL (ref 211–911)

## 2013-07-11 LAB — LIPID PANEL
Cholesterol: 124 mg/dL (ref 0–200)
Total CHOL/HDL Ratio: 2.3 RATIO

## 2013-07-11 MED ORDER — STROKE: EARLY STAGES OF RECOVERY BOOK
Freq: Once | Status: AC
  Administered 2013-07-11: 08:00:00
  Filled 2013-07-11: qty 1

## 2013-07-11 NOTE — Progress Notes (Signed)
Pt received stroke education. She watched the video and received the booklet from pharmacy. Pt and RN went through pamphlet and the pt has no questions at this time. Will ask again later today if she has any questions. Will continue to monitor.

## 2013-07-11 NOTE — Progress Notes (Signed)
*  PRELIMINARY RESULTS* Echocardiogram 2D Echocardiogram has been performed.  Tera Partridge 07/11/2013, 12:08 PM

## 2013-07-11 NOTE — Progress Notes (Signed)
[facility] PROGRESS NOTE  BENICIA BERGEVIN KMM:[id number] DOB: 1951-02-19 DOA: 07/10/2013 PCP: Vicenta Aly, FNP  HPI: Stacy Hansen is a 62 y.o. female who presented with a 2 hour episode of left-sided sharp headache associated with left arm and left leg weakness and also visual disturbance in the left eye.  Assessment/Plan:  TIA - neurology consulted, appreciate input. Aspirin started. EEG pending.  Tobacco abuse - counseled for cessation.  Anxiety - present today, related to inability to smoke.  Chron disease - stable DVT prophylaxis - heparin s.q.  Code Status: Full Family Communication: husband  Disposition Plan: home when medically ready, likely 1 day  Consultants:  Neurology  Procedures:  none  Anti-infectives   None     Antibiotics Given (last 72 hours)   None      HPI/Subjective: Feeling well today, neuro complaints improved.   Objective: Filed Vitals:   07/10/13 1900 07/10/13 2146 07/11/13 0610 07/11/13 0654  BP: 115/61 103/63  94/54  Pulse:  65    Temp:  97.8 F (36.6 C) 98.2 F (36.8 C)   TempSrc:  Oral Oral   Resp:      Height:  5' 3"  (1.6 m)    Weight:  55.1 kg (121 lb 7.6 oz)    SpO2:  98% 98%    No intake or output data in the 24 hours ending 07/11/13 0855 Filed Weights   07/10/13 2146  Weight: 55.1 kg (121 lb 7.6 oz)    Exam:   General:  NAD  Cardiovascular: regular rate and rhythm, without MRG  Respiratory: good air movement, clear to auscultation throughout, no wheezing, ronchi or rales  Abdomen: soft, not tender to palpation, positive bowel sounds  MSK: no peripheral edema  Neuro: non focal  Data Reviewed: Basic Metabolic Panel:  Recent Labs Lab 07/10/13 1650  NA 138  K 3.7  CL 101  CO2 27  GLUCOSE 116*  BUN 9  CREATININE 0.71  CALCIUM 9.4   Liver Function Tests:  Recent Labs Lab 07/10/13 1650  AST 24  ALT 8  ALKPHOS 70  BILITOT 0.2*  PROT 7.6  ALBUMIN 4.0   CBC:  Recent Labs Lab  07/10/13 1650  WBC 8.1  NEUTROABS 5.4  HGB 13.2  HCT 39.4  MCV 91.6  PLT 286   Cardiac Enzymes:  Recent Labs Lab 07/10/13 1650  TROPONINI <0.30   Studies: Dg Chest 2 View  07/10/2013   *RADIOLOGY REPORT*  Clinical Data: Stroke.  CHEST - 2 VIEW  Comparison: 05/05/2012  Findings: The lungs are adequately inflated with mild emphysematous disease.  There is no focal consolidation or effusion.  The cardiomediastinal silhouette and remainder of the exam is unchanged.  IMPRESSION: No acute cardiopulmonary disease.  Emphysematous disease.   Original Report Authenticated By: Marin Olp, M.D.   Ct Head Wo Contrast  07/10/2013   *RADIOLOGY REPORT*  Clinical Data: Left sided facial droop/weakness.  Left eye twitching.  Difficulty speaking.  Headache.  CT HEAD WITHOUT CONTRAST  Technique:  Contiguous axial images were obtained from the base of the skull through the vertex without contrast.  Comparison: 03/11/2005  Findings: The ventricles, cisterns and others CSF spaces are within normal.  There is no mass, mass effect, shift midline structures or acute hemorrhage.  There is no evidence of acute infarction.  Bones and soft tissues are within normal.  IMPRESSION: No acute intracranial findings.   Original Report Authenticated By: Marin Olp, M.D.   Mr [facility] Wo Contrast  07/10/2013   *RADIOLOGY REPORT*  Clinical Data:  Possible stroke.  Left-sided weakness.  Headache.  MRI HEAD WITHOUT CONTRAST MRA HEAD WITHOUT CONTRAST  Technique:  Multiplanar, multiecho pulse sequences of the brain and surrounding structures were obtained without intravenous contrast. Angiographic images of the head were obtained using MRA technique without contrast.  Comparison:  CT head earlier today.  Prior MRI and the catheter angiogram from 2008 and 2010.  MRI HEAD  Findings:  There is no evidence for acute infarction, intracranial hemorrhage, mass lesion, hydrocephalus, or extra-axial fluid. There is no significant atrophy or  large vessel infarct.  Scattered white matter foci consistent with chronic microvascular ischemic change affecting periventricular and subcortical white matter. There are no foci of chronic hemorrhage.  No midline shift.  No osseous findings.  Extracranial soft tissues unremarkable. Compared with prior CT, the appearance is similar.  Compared with prior MR, there has been mild progression of small vessel disease.  IMPRESSION: No acute intracranial findings.  Mild small vessel disease.  MRA HEAD  Findings: A primitive carotid to basilar communication, persistent trigeminal artery, is present on the right.  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  Both internal carotid arteries widely patent.  The basilar is hypoplastic but there is no intrinsic atherosclerotic change noted. Contribution of the posterior circulation is also provided by the fairly prominent right posterior communicating artery.   There is no proximal stenosis of the anterior, middle, or posterior cerebral arteries.   No visible cerebellar branch occlusion.  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  No proximal intracranial stenosis.  Similar appearance to priors.   Original Report Authenticated By: Rolla Flatten, M.D.   Mr Brain Wo Contrast  07/10/2013   *RADIOLOGY REPORT*  Clinical Data:  Possible stroke.  Left-sided weakness.  Headache.  MRI HEAD WITHOUT CONTRAST MRA HEAD WITHOUT CONTRAST  Technique:  Multiplanar, multiecho pulse sequences of the brain and surrounding structures were obtained without intravenous contrast. Angiographic images of the head were obtained using MRA technique without contrast.  Comparison:  CT head earlier today.  Prior MRI and the catheter angiogram from 2008 and 2010.  MRI HEAD  Findings:  There is no evidence for acute infarction, intracranial hemorrhage, mass lesion, hydrocephalus, or extra-axial fluid. There is no  significant atrophy or large vessel infarct.  Scattered white matter foci consistent with chronic microvascular ischemic change affecting periventricular and subcortical white matter. There are no foci of chronic hemorrhage.  No midline shift.  No osseous findings.  Extracranial soft tissues unremarkable. Compared with prior CT, the appearance is similar.  Compared with prior MR, there has been mild progression of small vessel disease.  IMPRESSION: No acute intracranial findings.  Mild small vessel disease.  MRA HEAD  Findings: A primitive carotid to basilar communication, persistent trigeminal artery, is present on the right.  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  Both internal carotid arteries widely patent.  The basilar is hypoplastic but there is no intrinsic atherosclerotic change noted. Contribution of the posterior circulation is also provided by the fairly prominent right posterior communicating artery.   There is no proximal stenosis of the anterior, middle, or posterior cerebral arteries.   No visible cerebellar branch occlusion.  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  No proximal intracranial stenosis.  Similar appearance  to priors.   Original Report Authenticated By: Rolla Flatten, M.D.   US Carotid Duplex Bilateral  07/11/2013   *RADIOLOGY REPORT*  Clinical Data: Stroke symptoms, smoker  BILATERAL CAROTID DUPLEX ULTRASOUND  Technique: Pearline Cables scale imaging, color Doppler and duplex ultrasound were performed of bilateral carotid and vertebral arteries in the neck.  Comparison:  None.  Criteria:  Quantification of carotid stenosis is based on velocity parameters that correlate the residual internal carotid diameter with NASCET-based stenosis levels, using the diameter of the distal internal carotid lumen as the denominator for stenosis measurement.  The following velocity measurements were  obtained:                   PEAK SYSTOLIC/END DIASTOLIC RIGHT ICA:                        88/44cm/sec CCA:                        11/65BX/UXY SYSTOLIC ICA/CCA RATIO:     3.33 DIASTOLIC ICA/CCA RATIO:    1.77 ECA:                        81cm/sec  LEFT ICA:                        95/42cm/sec CCA:                        83/29VB/TYO SYSTOLIC ICA/CCA RATIO:     0.60 DIASTOLIC ICA/CCA RATIO:    1.69 ECA:                        85cm/sec  Findings:  RIGHT CAROTID ARTERY: Very minor atherosclerotic changes.  No hemodynamically significant right ICA stenosis, velocity elevation, or turbulent flow.  RIGHT VERTEBRAL ARTERY:  Antegrade  LEFT CAROTID ARTERY: Minor atherosclerotic changes.  No hemodynamically significant left ICA stenosis, velocity elevation, or turbulent flow.  LEFT VERTEBRAL ARTERY:  Antegrade  IMPRESSION: No hemodynamically significant ICA stenosis   Original Report Authenticated By: Jerilynn Mages. Shick, M.D.    Scheduled Meds: .  stroke: mapping our early stages of recovery book   Does not apply Once  . aspirin  300 mg Rectal Daily   Or  . aspirin  325 mg Oral Daily  . dicyclomine  10 mg Oral BID  . heparin  5,000 Units Subcutaneous Q8H  . mesalamine  3.6 g Oral Q breakfast  . pantoprazole  40 mg Oral Daily   Continuous Infusions:   Active Problems:   Anxiety state, unspecified   TOBACCO ABUSE   CROHN'S DISEASE   FIBROMYALGIA   CVA (cerebral infarction)  Time spent: 25  Marzetta Board, MD [facility] Pager [phone]. If 7 PM - 7 AM, please contact night-coverage at [email or web address], password [email or web address] 07/11/2013, 8:55 AM  LOS: 1 day

## 2013-07-11 NOTE — Progress Notes (Signed)
EEG Completed; Results Pending  

## 2013-07-11 NOTE — Consult Note (Signed)
[place] A. Merlene Laughter, MD     [email or web address]          Stacy Hansen is an 62 y.o. female.   ASSESSMENT/PLAN: 1. Unusual episode of focal neurological symptoms referable to the right hemisphere. Symptoms including visual symptoms, weakness and numbness. The symptoms do not seem to be consistent with the ophthalmic artery symptoms given that she also has symptoms involving the same side involving the facial region and left upper extremity. 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 The patient has actually had these spells as early as 2010 and had a four-vessel angiography which shows a small aneurysm right side involving the trigeminal artery. Current MRA corroborates these findings and do not show significant intracranial occlusive disease. She has unusual fetal tablet blood vessels which raises the potential that these could be concerning to her symptoms given that her symptoms are referable to the right hemisphere. Recommendations EEG and low-dose aspirin.    The patient is a 62 year old left-handed white female who reports having to spells of visual obscuration. The initial event occurred a month ago lasting for about 15-20 minutes. She had another event on yesterday which was more intense had more symptoms and lasted for about 2 hours. She actually still has some residual problems involving the left hand. She reports that the left hand seems a little weak. She also reports having weakness of the left leg. The semiology of the patient event months ago consist of gr/blackening of her vision involving the right upper half of her field on the left side. She then reports having flashes of lights as if lightening is flashing involving the entire visual field on the left side. She then has the numbness and heaviness of the left upper extremity, left facial region  and also left lower extremity. She will she reports having headaches involving the left frontal region after the events. Again, the event 4 weeks ago lasted only about 15 or 20 minutes. This time around, she will show had to nausea vomiting twice. The symptoms are otherwise the same including the left-sided symptoms, left facial numbness and weakness and the left frontal headaches. She denies chest pain or shortness of breath. There is no GU symptoms.  The patient's history is reviewed and she did have extensive workup in 2010. The workup included a MRI MRA. At that time, she presented with left facial numbness and hair loss last in for several minutes to an hour. It appears that she also had a similar event in 2006 resulted in the patient having a four-vessel angiography. The workup at that time showed a right trigeminal artery and fetal circulation of the posterior circulation along with a small aneurysm on the right.     The patient does have some left shoulder pain. She was involved in a injury requiring surgery of the left shoulder and left forearm effusion status injury.   GENERAL: This is a pleasant thin lady in no acute distress.  HEENT: Neck is supple head is normocephalic atraumatic. The patient does have significant soreness involving the scapular on the left and also the left wrist.  ABDOMEN: soft  EXTREMITIES: No edema   BACK: Unremarkable.   SKIN: Normal by inspection.    MENTAL STATUS: Alert and oriented. Speech, language and cognition are generally intact. Judgment and insight normal.   CRANIAL NERVES: Pupils are equal, round and reactive to light and accommodation; extra  ocular movements are full, there is no significant nystagmus; visual fields are full; upper and lower facial muscles are normal in strength and symmetric, there is no flattening of the nasolabial folds; tongue is midline; uvula is midline; shoulder elevation is normal.  MOTOR: Normal tone, bulk and strength on  my examination although she complains of weakness on the left side; there may be mild downward drift on the left. She does have mild impairment of fine finger tapping on the left compared to the right. She is left-handed.  COORDINATION: Left finger to nose is normal, right finger to nose is normal, No rest tremor; no intention tremor; no postural tremor; no bradykinesia.  REFLEXES: Deep tendon reflexes are symmetrical and normal. Plantar responses are flexor bilaterally.   SENSATION: There is reduced sensation to temperature and light touch left upper extremity and left lower extremity.     Past Medical History  Diagnosis Date  . Atypical chest pain   . Tobacco abuse   . History of fibromyalgia   . Crohn disease   . Depression   . Anxiety   . Brain aneurysm   . Hyperplastic colon polyp   . GERD (gastroesophageal reflux disease)   . Panic attacks     Past Surgical History  Procedure Laterality Date  . Orif ulnar fracture      left   . Rotator cuff repair      left  . Vaginal hysterectomy      has her ovaries  . Tubal ligation    . Tonsillectomy    . Cataract extraction      bilateral  . Vein removed      right varicose vein    Family History  Problem Relation Age of Onset  . Diabetes Mother   . Kidney failure Mother   . Diabetes Brother   . Stroke Father   . Diabetes Sister     x 2  . Colon cancer Brother     in his 34's    Social History:  reports that she has been smoking Cigarettes.  She has been smoking about 0.00 packs per day. She has never used smokeless tobacco. She reports that she does not drink alcohol or use illicit drugs.  Allergies:  Allergies  Allergen Reactions  . Ciprofloxacin Other (See Comments)    Unknown reaction per pt  . Meperidine Hcl Nausea And Vomiting and Other (See Comments)    Causes blood pressure to drop when taken with Phenergan through IV  . Promethazine Hcl Nausea And Vomiting and Other (See Comments)    Causes blood  pressure to drop when taken with Demeral via IV  . Propoxyphene-Acetaminophen Other (See Comments)    "pass out"    Medications: Prior to Admission medications   Medication Sig Start Date End Date Taking? Authorizing Provider  acetaminophen (TYLENOL) 500 MG tablet Take 500 mg by mouth every 6 (six) hours as needed for pain.   Yes Historical Provider, MD  LORazepam (ATIVAN) 0.5 MG tablet Take 1 tablet (0.5 mg total) by mouth daily as needed. For anxiety 05/06/12  Yes Reyne Dumas, MD  dicyclomine (BENTYL) 10 MG capsule Take 1 capsule (10 mg total) by mouth 2 (two) times daily. 07/05/13   Lafayette Dragon, MD  mesalamine (LIALDA) 1.2 G EC tablet Take 3 tablets (3.6 g total) by mouth daily with breakfast. 07/05/13   Lafayette Dragon, MD  omeprazole (PRILOSEC) 20 MG capsule Take 1 capsule (20 mg total) by mouth daily.  07/05/13   Lafayette Dragon, MD  ondansetron (ZOFRAN) 4 MG tablet Take 1 tablet (4 mg total) by mouth every 8 (eight) hours as needed for nausea. 07/05/13   Lafayette Dragon, MD    Scheduled Meds: .  stroke: mapping our early stages of recovery book   Does not apply Once  . aspirin  300 mg Rectal Daily   Or  . aspirin  325 mg Oral Daily  . dicyclomine  10 mg Oral BID  . heparin  5,000 Units Subcutaneous Q8H  . mesalamine  3.6 g Oral Q breakfast  . pantoprazole  40 mg Oral Daily   Continuous Infusions:  PRN Meds:.acetaminophen, LORazepam, LORazepam, ondansetron   Blood pressure 94/54, pulse 65, temperature 98.2 F (36.8 C), temperature source Oral, resp. rate 13, height 5' 3"  (1.6 m), weight 55.1 kg (121 lb 7.6 oz), SpO2 98.00%.   Results for orders placed during the hospital encounter of 07/10/13 (from the past 48 hour(s))  CBC WITH DIFFERENTIAL     Status: None   Collection Time    07/10/13  4:50 PM      Result Value Range   WBC 8.1  4.0 - 10.5 K/uL   RBC 4.30  3.87 - 5.11 MIL/uL   Hemoglobin 13.2  12.0 - 15.0 g/dL   HCT 39.4  36.0 - 46.0 %   MCV 91.6  78.0 - 100.0 fL   MCH 30.7   26.0 - 34.0 pg   MCHC 33.5  30.0 - 36.0 g/dL   RDW 13.2  11.5 - 15.5 %   Platelets 286  150 - 400 K/uL   Neutrophils Relative % 67  43 - 77 %   Neutro Abs 5.4  1.7 - 7.7 K/uL   Lymphocytes Relative 24  12 - 46 %   Lymphs Abs 1.9  0.7 - 4.0 K/uL   Monocytes Relative 6  3 - 12 %   Monocytes Absolute 0.5  0.1 - 1.0 K/uL   Eosinophils Relative 2  0 - 5 %   Eosinophils Absolute 0.2  0.0 - 0.7 K/uL   Basophils Relative 1  0 - 1 %   Basophils Absolute 0.0  0.0 - 0.1 K/uL  TROPONIN I     Status: None   Collection Time    07/10/13  4:50 PM      Result Value Range   Troponin I <0.30  <0.30 ng/mL   Comment:            Due to the release kinetics of cTnI,     a negative result within the first hours     of the onset of symptoms does not rule out     myocardial infarction with certainty.     If myocardial infarction is still suspected,     repeat the test at appropriate intervals.  PROTIME-INR     Status: None   Collection Time    07/10/13  4:50 PM      Result Value Range   Prothrombin Time 12.7  11.6 - 15.2 seconds   INR 0.97  0.00 - 1.49  APTT     Status: None   Collection Time    07/10/13  4:50 PM      Result Value Range   aPTT 33  24 - 37 seconds  COMPREHENSIVE METABOLIC PANEL     Status: Abnormal   Collection Time    07/10/13  4:50 PM      Result Value Range  Sodium 138  135 - 145 mEq/L   Potassium 3.7  3.5 - 5.1 mEq/L   Chloride 101  96 - 112 mEq/L   CO2 27  19 - 32 mEq/L   Glucose, Bld 116 (*) 70 - 99 mg/dL   BUN 9  6 - 23 mg/dL   Creatinine, Ser 0.71  0.50 - 1.10 mg/dL   Calcium 9.4  8.4 - 10.5 mg/dL   Total Protein 7.6  6.0 - 8.3 g/dL   Albumin 4.0  3.5 - 5.2 g/dL   AST 24  0 - 37 U/L   ALT 8  0 - 35 U/L   Alkaline Phosphatase 70  39 - 117 U/L   Total Bilirubin 0.2 (*) 0.3 - 1.2 mg/dL   GFR calc non Af Amer >90  >90 mL/min   GFR calc Af Amer >90  >90 mL/min   Comment: (NOTE)     The eGFR has been calculated using the CKD EPI equation.     This calculation has  not been validated in all clinical situations.     eGFR's persistently <90 mL/min signify possible Chronic Kidney     Disease.  LIPID PANEL     Status: None   Collection Time    07/11/13  6:14 AM      Result Value Range   Cholesterol 124  0 - 200 mg/dL   Triglycerides 64  <150 mg/dL   HDL 53  >39 mg/dL   Total CHOL/HDL Ratio 2.3     VLDL 13  0 - 40 mg/dL   LDL Cholesterol 58  0 - 99 mg/dL   Comment:            Total Cholesterol/HDL:CHD Risk     Coronary Heart Disease Risk Table                         Men   Women      1/2 Average Risk   3.4   3.3      Average Risk       5.0   4.4      2 X Average Risk   9.6   7.1      3 X Average Risk  23.4   11.0                Use the calculated Patient Ratio     above and the CHD Risk Table     to determine the patient's CHD Risk.                ATP III CLASSIFICATION (LDL):      <100     mg/dL   Optimal      100-129  mg/dL   Near or Above                        Optimal      130-159  mg/dL   Borderline      160-189  mg/dL   High      >190     mg/dL   Very High    CAROTIDS DOP FINE.  SCANS REVIEWED IN PERSON:  *RADIOLOGY REPORT*  Clinical Data: Possible stroke. Left-sided weakness. Headache.  MRI HEAD WITHOUT CONTRAST  MRA HEAD WITHOUT CONTRAST  Technique: Multiplanar, multiecho pulse sequences of the brain and  surrounding structures were obtained without intravenous contrast.  Angiographic images of the head were obtained using MRA technique  without contrast.  Comparison: CT head earlier today. Prior MRI and the catheter  angiogram from 2008 and 2010.  MRI HEAD  Findings: There is no evidence for acute infarction, intracranial  hemorrhage, mass lesion, hydrocephalus, or extra-axial fluid.  There is no significant atrophy or large vessel infarct. Scattered  white matter foci consistent with chronic microvascular ischemic  change affecting periventricular and subcortical white matter.  There are no foci of chronic hemorrhage.  No midline shift. No  osseous findings. Extracranial soft tissues unremarkable.  Compared with prior CT, the appearance is similar. Compared with  prior MR, there has been mild progression of small vessel disease.  IMPRESSION:  No acute intracranial findings. Mild small vessel disease.  MRA HEAD  Findings: A primitive carotid to basilar communication, persistent  trigeminal artery, is present on the right. A proximal wide neck  2 x 4 mm aneurysm arising from that vessel is redemonstrated and  stable.  Both internal carotid arteries widely patent. The basilar is  hypoplastic but there is no intrinsic atherosclerotic change noted.  Contribution of the posterior circulation is also provided by the  fairly prominent right posterior communicating artery. There is  no proximal stenosis of the anterior, middle, or posterior cerebral  arteries. No visible cerebellar branch occlusion.  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 No proximal intracranial stenosis. Similar  appearance to priors.  Original Report Authenticated By: Rolla Flatten, M.D.      Stacy Hansen A. Merlene Laughter, M.D.  Diplomate, Tax adviser of Psychiatry and Neurology ( Neurology). 07/11/2013, 9:33 AM

## 2013-07-11 NOTE — Progress Notes (Signed)
OT Cancellation Note  Patient Details Name: Stacy Hansen MRN: [id number] DOB: [birth year]   Cancelled Treatment:    Reason Eval/Treat Not Completed: Patient at procedure or test/ unavailable. Will re-attempt eval when able.   Ailene Ravel, OTR/L,CBIS   07/11/2013, 8:37 AM

## 2013-07-11 NOTE — Evaluation (Signed)
Physical Therapy Evaluation Patient Details Name: Stacy Hansen MRN: [id number] DOB: [birth year] Today's Date: 07/11/2013 Time: 9244-6286 PT Time Calculation (min): 36 min  PT Assessment / Plan / Recommendation History of Present Illness   Pt is a 62 yo female who states that she has been noticing weakness in her Lt arm and leg for about a month.  Pt works as a Chemical engineer at [employer] and states although it has not affected her work when she gets home she has no energy of strength to do anything else.    Clinical Impression  Pt would benefit from high level balance and high level strengthening exercise in an outpatient setting.    PT Assessment  All further PT needs can be met in the next venue of care    Follow Up Recommendations  Outpatient PT    Does the patient have the potential to tolerate intense rehabilitation    N/A  Barriers to Discharge  none      Equipment Recommendations   none    Recommendations for Other Services   OT  Frequency   OP   Precautions / Restrictions Precautions Precautions: None Restrictions Weight Bearing Restrictions: No   Pertinent Vitals/Pain 0/10      Mobility  Bed Mobility Bed Mobility: Supine to Sit Supine to Sit: 7: Independent Transfers Transfers: Sit to Stand;Stand to Sit Sit to Stand: 7: Independent Stand to Sit: 7: Independent Ambulation/Gait Ambulation/Gait Assistance: 7: Independent Ambulation Distance (Feet): 300 Feet Assistive device: None Ambulation/Gait Assistance Details: one loss of balance but pt able to self correct without difficulty Gait velocity: normal Stairs: No    Exercises General Exercises - Lower Extremity Ankle Circles/Pumps: AROM;Both;10 reps Long Arc Quad: AROM;Left;10 reps Hip ABduction/ADduction: AROM;Left;10 reps Straight Leg Raises: AROM;Left;10 reps Heel Raises: AROM;Both;10 reps Mini-Sqauts: AROM;Both;10 reps   PT Diagnosis:    PT Problem List: Decreased strength;Decreased  balance PT Treatment Interventions:       PT Goals(Current goals can be found in the care plan section) Acute Rehab PT Goals PT Goal Formulation: No goals set, d/c therapy  Visit Information  Last PT Received On: 07/11/13       Prior [place] expects to be discharged to:: Private residence Living Arrangements: Spouse/significant other Available Help at Discharge: Family Type of Home: House Home Access: Stairs to enter Technical brewer of Steps: 5 Entrance Stairs-Rails: Right Home Layout: One level Home Equipment: None Prior Function Level of Independence: Independent Communication Communication: No difficulties    Cognition  Cognition Arousal/Alertness: Awake/alert Overall Cognitive Status: Within Functional Limits for tasks assessed    Extremity/Trunk Assessment Upper Extremity Assessment Upper Extremity Assessment: Defer to OT evaluation Lower Extremity Assessment Lower Extremity Assessment: LLE deficits/detail LLE Deficits / Details: General mm strength of 3+/5 except for dorsiflexion which is 3/5 Cervical / Trunk Assessment Cervical / Trunk Assessment: Normal   Balance Balance Balance Assessed: Yes Static Standing Balance Single Leg Stance - Right Leg: 12 Single Leg Stance - Left Leg: 12  End of Session PT - End of Session Equipment Utilized During Treatment: Gait belt Activity Tolerance: Patient tolerated treatment well Patient left: in chair;with call bell/phone within reach  GP     RUSSELL,CINDY 07/11/2013, 9:36 AM

## 2013-07-11 NOTE — Care Management Note (Signed)
    Page 1 of 1   07/11/2013     2:31:24 PM   CARE MANAGEMENT NOTE 07/11/2013  Patient:  Stacy Hansen, Stacy Hansen   Account Number:  [id number]  Date Initiated:  07/11/2013  Documentation initiated by:  Theophilus Kinds  Subjective/Objective Assessment:   Pt admitted from home with possible CVA. Pt lives with her husband and will return home at discharge.Pt is independent with ADL's.     Action/Plan:   No CM needs noted.   Anticipated DC Date:  07/12/2013   Anticipated DC Plan:  [place]  CM consult      Choice offered to / List presented to:             Status of service:  Completed, signed off Medicare Important Message given?   (If response is "NO", the following Medicare IM given date fields will be blank) Date Medicare IM given:   Date Additional Medicare IM given:    Discharge Disposition:  HOME/SELF CARE  Per UR Regulation:    If discussed at Long Length of Stay Meetings, dates discussed:    Comments:  07/11/13 [place], RN BSN CM

## 2013-07-12 DIAGNOSIS — R29898 Other symptoms and signs involving the musculoskeletal system: Principal | ICD-10-CM

## 2013-07-12 MED ORDER — ASPIRIN EC 81 MG PO TBEC: 81.0000 mg | DELAYED_RELEASE_TABLET | Freq: Every day | ORAL | Status: DC

## 2013-07-12 MED ORDER — CYANOCOBALAMIN 1000 MCG/ML IJ SOLN
1000.0000 ug | Freq: Once | INTRAMUSCULAR | Status: AC
  Administered 2013-07-12: 1000 ug via INTRAMUSCULAR
  Filled 2013-07-12: qty 1

## 2013-07-12 NOTE — Progress Notes (Signed)
Patient ID: Stacy Hansen, female   DOB: 1950-12-14, 62 y.o.   MRN: [id number]  Stacy A. Merlene Laughter, MD     [email or web address]          Stacy Hansen is an 62 y.o. female.   Assessment/Plan: Recurrent spells of left sided numbness and weakness of unclear etiology. Differential diagnoses remain the same including complex partial seizures, complex migraine/migraine with aura and I think less likely ischemia given the negative imaging. We recommended outpatient follow-up with [place] in about 4 weeks. She is continue on low-dose aspirin 81 mg.  The patient reports no spells since been admitted. She is awake and alert. She is lucid and coherent. Facial muscle strength is symmetric today. She was both sides well.  EEG is reviewed and is normal.    Objective: Vital signs in last 24 hours: Temp:  [97.7 F (36.5 C)-98 F (36.7 C)] 97.7 F (36.5 C) (08/28 0454) Pulse Rate:  [72-85] 85 (08/28 0454) Resp:  [15] 15 (08/28 0454) BP: (99-110)/(61-70) 99/63 mmHg (08/28 0454) SpO2:  [95 %-98 %] 96 % (08/28 0454)  Intake/Output from previous day: 08/27 0701 - 08/28 0700 In: 840 [P.O.:840] Out: -  Intake/Output this shift:   Nutritional status: Cardiac   Lab Results: Results for orders placed during the hospital encounter of 07/10/13 (from the past 48 hour(s))  CBC WITH DIFFERENTIAL     Status: None   Collection Time    07/10/13  4:50 PM      Result Value Range   WBC 8.1  4.0 - 10.5 K/uL   RBC 4.30  3.87 - 5.11 MIL/uL   Hemoglobin 13.2  12.0 - 15.0 g/dL   HCT 39.4  36.0 - 46.0 %   MCV 91.6  78.0 - 100.0 fL   MCH 30.7  26.0 - 34.0 pg   MCHC 33.5  30.0 - 36.0 g/dL   RDW 13.2  11.5 - 15.5 %   Platelets 286  150 - 400 K/uL   Neutrophils Relative % 67  43 - 77 %   Neutro Abs 5.4  1.7 - 7.7 K/uL   Lymphocytes Relative 24  12 - 46 %   Lymphs Abs 1.9  0.7 - 4.0 K/uL   Monocytes Relative 6  3 - 12 %   Monocytes Absolute 0.5  0.1 - 1.0 K/uL   Eosinophils Relative  2  0 - 5 %   Eosinophils Absolute 0.2  0.0 - 0.7 K/uL   Basophils Relative 1  0 - 1 %   Basophils Absolute 0.0  0.0 - 0.1 K/uL  TROPONIN I     Status: None   Collection Time    07/10/13  4:50 PM      Result Value Range   Troponin I <0.30  <0.30 ng/mL   Comment:            Due to the release kinetics of cTnI,     a negative result within the first hours     of the onset of symptoms does not rule out     myocardial infarction with certainty.     If myocardial infarction is still suspected,     repeat the test at appropriate intervals.  PROTIME-INR     Status: None   Collection Time    07/10/13  4:50 PM      Result Value Range   Prothrombin Time 12.7  11.6 - 15.2 seconds   INR 0.97  0.00 - 1.49  APTT     Status: None   Collection Time    07/10/13  4:50 PM      Result Value Range   aPTT 33  24 - 37 seconds  COMPREHENSIVE METABOLIC PANEL     Status: Abnormal   Collection Time    07/10/13  4:50 PM      Result Value Range   Sodium 138  135 - 145 mEq/L   Potassium 3.7  3.5 - 5.1 mEq/L   Chloride 101  96 - 112 mEq/L   CO2 27  19 - 32 mEq/L   Glucose, Bld 116 (*) 70 - 99 mg/dL   BUN 9  6 - 23 mg/dL   Creatinine, Ser 0.71  0.50 - 1.10 mg/dL   Calcium 9.4  8.4 - 10.5 mg/dL   Total Protein 7.6  6.0 - 8.3 g/dL   Albumin 4.0  3.5 - 5.2 g/dL   AST 24  0 - 37 U/L   ALT 8  0 - 35 U/L   Alkaline Phosphatase 70  39 - 117 U/L   Total Bilirubin 0.2 (*) 0.3 - 1.2 mg/dL   GFR calc non Af Amer >90  >90 mL/min   GFR calc Af Amer >90  >90 mL/min   Comment: (NOTE)     The eGFR has been calculated using the CKD EPI equation.     This calculation has not been validated in all clinical situations.     eGFR's persistently <90 mL/min signify possible Chronic Kidney     Disease.  HEMOGLOBIN A1C     Status: Abnormal   Collection Time    07/11/13  6:14 AM      Result Value Range   Hemoglobin A1C 6.1 (*) <5.7 %   Comment: (NOTE)                                                                                According to the ADA Clinical Practice Recommendations for 2011, when     HbA1c is used as a screening test:      >=6.5%   Diagnostic of Diabetes Mellitus               (if abnormal result is confirmed)     5.7-6.4%   Increased risk of developing Diabetes Mellitus     References:Diagnosis and Classification of Diabetes Mellitus,Diabetes     ONGE,9528,41(LKGMW 1):S62-S69 and Standards of Medical Care in             Diabetes - 2011,Diabetes Care,2011,34 (Suppl 1):S11-S61.   Mean Plasma Glucose 128 (*) <117 mg/dL   Comment: Performed at [facility]     Status: None   Collection Time    07/11/13  6:14 AM      Result Value Range   Cholesterol 124  0 - 200 mg/dL   Triglycerides 64  <150 mg/dL   HDL 53  >39 mg/dL   Total CHOL/HDL Ratio 2.3     VLDL 13  0 - 40 mg/dL   LDL Cholesterol 58  0 - 99 mg/dL   Comment:            Total Cholesterol/HDL:CHD  Risk     Coronary Heart Disease Risk Table                         Men   Women      1/2 Average Risk   3.4   3.3      Average Risk       5.0   4.4      2 X Average Risk   9.6   7.1      3 X Average Risk  23.4   11.0                Use the calculated Patient Ratio     above and the CHD Risk Table     to determine the patient's CHD Risk.                ATP III CLASSIFICATION (LDL):      <100     mg/dL   Optimal      100-129  mg/dL   Near or Above                        Optimal      130-159  mg/dL   Borderline      160-189  mg/dL   High      >190     mg/dL   Very High  TSH     Status: None   Collection Time    07/11/13 10:47 AM      Result Value Range   TSH 1.416  0.350 - 4.500 uIU/mL   Comment: Performed at [facility]     Status: None   Collection Time    07/11/13 10:47 AM      Result Value Range   Vitamin B-12 256  211 - 911 pg/mL   Comment: Performed at [facility] [facility]     Status: None   Collection Time    07/11/13 10:47 AM      Result Value Range    Homocysteine 11.4  4.0 - 15.4 umol/L   Comment: Performed at [facility]  07/11/13 0614  CHOL 124  TRIG 64  HDL 53  CHOLHDL 2.3  VLDL 13  LDLCALC 58    Studies/Results:   Medications:  Scheduled Meds: . aspirin  300 mg Rectal Daily   Or  . aspirin  325 mg Oral Daily  . dicyclomine  10 mg Oral BID  . heparin  5,000 Units Subcutaneous Q8H  . mesalamine  3.6 g Oral Q breakfast  . pantoprazole  40 mg Oral Daily   Continuous Infusions:  PRN Meds:.acetaminophen, LORazepam, LORazepam, ondansetron     LOS: 2 days   Stacy Hansen, M.D.  Diplomate, Tax adviser of Psychiatry and Neurology ( Neurology).

## 2013-07-12 NOTE — Procedures (Signed)
[place] A. Merlene Laughter, MD     [email or web address]        NAMEDAENERYS, BUTTRAM               ACCOUNT NO.:  [id number]  MEDICAL RECORD NO.:  [id number]  LOCATION:  EE                           FACILITY:  [facility]  PHYSICIAN:  Sirena Riddle A. Merlene Laughter, M.D. DATE OF BIRTH:  October 17, 1951  DATE OF PROCEDURE:  07/11/2013 DATE OF DISCHARGE:  07/11/2013                             EEG INTERPRETATION   The patient is a 62 year old who presents with recurrent spells of numbness involving the left facial region associated with visual changes and left-sided symptoms.  The study is being done to evaluate for seizures.  MEDICATIONS:  Aspirin, dicyclomine, heparin, Protonix.  ANALYSIS:  A 16 channel recording using standard 10/20 measurement is conducted for 22 minutes.  There is a well-formed posterior dominant rhythm of 12 hertz which attenuates with eye opening.  There is beta activity observed in the frontal areas.  Awake and drowsy activities are recorded.  Photic stimulation is carried out without abnormal changes in the background activity.  There is no focal or lateralized slowing. There is no epileptiform activity observed.  IMPRESSION:  Normal recording of the awake and drowsy states.     Tami Blass A. Merlene Laughter, M.D.     KAD/MEDQ  D:  07/12/2013  T:  07/12/2013  Job:  [id number]

## 2013-07-12 NOTE — Progress Notes (Signed)
Pt is to be discharged home today. Pt is in NAD, IV is out, all paperwork has been reviewed/discussed with patient, and there are no questions/concerns at this time. Assessment is unchanged from this morning. Pt is to be accompanied downstairs by staff and family via wheelchair.  

## 2013-07-12 NOTE — Discharge Summary (Signed)
Physician Discharge Summary  Stacy Hansen:[id number] DOB: 1951/02/27 DOA: 07/10/2013  PCP: Stacy Aly, Stacy Hansen  Admit date: 07/10/2013 Discharge date: 07/12/2013  Time spent: 45 minutes  Recommendations for Outpatient Follow-up:  1. PCP follow up in 1-2 weeks 2. Neurology follow up in 1-2 weeks   Discharge Diagnoses:  Active Problems:   Anxiety state, unspecified   TOBACCO ABUSE   CROHN'S DISEASE   FIBROMYALGIA   CVA (cerebral infarction)  Discharge Condition: stable  Diet recommendation: heart healthy  Filed Weights   07/10/13 2146  Weight: 55.1 kg (121 lb 7.6 oz)   History of present illness:  Stacy Hansen is a 62 y.o. female who presents with a 2 hour episode of left-sided sharp headache associated with left arm and left leg weakness and also visual disturbance in the left thigh. She felt that she was unable to see the bottom half of her visual field. She had a similar episode one month ago, this time it lasted only 20 minutes. She has noticed since the episode one month ago that she has been clumsy with the left hand. She is left-handed. She has been dropping things on that side. Also, she has noticed her gait is not quite normal. There was no loss of consciousness with this episode. There was minimal confusion. The headache has resolved but she still feels that there is weakness in the left arm and leg. She is now being admitted for further investigations.  Hospital Course:  Recurrent spells of left sided numbness and weakness  - Neurology has been consulted and couldn't find a clear etiology for her symptoms. Per Neurology, differential diagnoses including complex partial seizures, complex migraine/migraine with aura and less likely ischemia given the negative imaging. Patient's symptoms have improved during her hospitalization. Per verbal report, EEG was normal but final read is pending at the time of discharge. She is to follow up with Stacy Hansen in about 4 weeks  post discharge. Tobacco abuse - patient was extensively counseled about tobacco cessation. Anxiety - stable Crohn's disease - stable, to resume GI outpatient followup, and resume her previous home medications.   Procedures:  EEG - final read pending at the time of discharge  2D echo Study Conclusions - Left ventricle: The cavity size was normal. Systolicfunction was normal. The estimated ejection fraction was in the range of 55% to 60%. Wall motion was normal; there were no regional wall motion abnormalities. Atrial septum: No defect or patent foramen ovale was identified.  Consultations:  Neurology  Discharge Exam: Filed Vitals:   07/11/13 1500 07/11/13 2107 07/12/13 0149 07/12/13 0454  BP: 110/70 100/61 102/67 99/63  Pulse: 72 83 83 85  Temp: 98 F (36.7 C) 97.9 F (36.6 C) 97.8 F (36.6 C) 97.7 F (36.5 C)  TempSrc: Oral Oral Oral Oral  Resp: 15 15 15 15   Height:      Weight:      SpO2: 98% 95% 95% 96%   General: NAD Cardiovascular: RRR Respiratory: CTA biL  Discharge Instructions      Discharge Orders   Future Appointments Provider Department Dept Phone   08/01/2013 9:30 AM Lafayette Dragon, MD [place] [phone]   Future Orders Complete By Expires   Ambulatory referral to Physical Therapy  As directed    Questions:     Iontophoresis - 4 mg/ml of dexamethasone:     T.E.N.S. Unit Evaluation and Dispense as Indicated:         Medication List  acetaminophen 500 MG tablet  Commonly known as:  TYLENOL  Take 500 mg by mouth every 6 (six) hours as needed for pain.     aspirin EC 81 MG tablet  Take 1 tablet (81 mg total) by mouth daily.     dicyclomine 10 MG capsule  Commonly known as:  BENTYL  Take 1 capsule (10 mg total) by mouth 2 (two) times daily.     LORazepam 0.5 MG tablet  Commonly known as:  ATIVAN  Take 1 tablet (0.5 mg total) by mouth daily as needed. For anxiety     mesalamine 1.2 G EC tablet  Commonly  known as:  LIALDA  Take 3 tablets (3.6 g total) by mouth daily with breakfast.     omeprazole 20 MG capsule  Commonly known as:  PRILOSEC  Take 1 capsule (20 mg total) by mouth daily.     ondansetron 4 MG tablet  Commonly known as:  ZOFRAN  Take 1 tablet (4 mg total) by mouth every 8 (eight) hours as needed for nausea.       Follow-up Information   Follow up with Stacy Aly, Stacy Hansen. Schedule an appointment as soon as possible for a visit in 2 weeks.   Specialty:  Nurse Practitioner   Contact information:   [address] [place] [address] [phone]       The results of significant diagnostics from this hospitalization (including imaging, microbiology, ancillary and laboratory) are listed below for reference.    Significant Diagnostic Studies: Dg Chest 2 View  07/10/2013   *RADIOLOGY REPORT*  Clinical Data: Stroke.  CHEST - 2 VIEW  Comparison: 05/05/2012  Findings: The lungs are adequately inflated with mild emphysematous disease.  There is no focal consolidation or effusion.  The cardiomediastinal silhouette and remainder of the exam is unchanged.  IMPRESSION: No acute cardiopulmonary disease.  Emphysematous disease.   Original Report Authenticated By: Marin Olp, M.D.   Ct Head Wo Contrast  07/10/2013   *RADIOLOGY REPORT*  Clinical Data: Left sided facial droop/weakness.  Left eye twitching.  Difficulty speaking.  Headache.  CT HEAD WITHOUT CONTRAST  Technique:  Contiguous axial images were obtained from the base of the skull through the vertex without contrast.  Comparison: 03/11/2005  Findings: The ventricles, cisterns and others CSF spaces are within normal.  There is no mass, mass effect, shift midline structures or acute hemorrhage.  There is no evidence of acute infarction.  Bones and soft tissues are within normal.  IMPRESSION: No acute intracranial findings.   Original Report Authenticated By: Marin Olp, M.D.   Mr [facility] Wo Contrast  07/10/2013    *RADIOLOGY REPORT*  Clinical Data:  Possible stroke.  Left-sided weakness.  Headache.  MRI HEAD WITHOUT CONTRAST MRA HEAD WITHOUT CONTRAST  Technique:  Multiplanar, multiecho pulse sequences of the brain and surrounding structures were obtained without intravenous contrast. Angiographic images of the head were obtained using MRA technique without contrast.  Comparison:  CT head earlier today.  Prior MRI and the catheter angiogram from 2008 and 2010.  MRI HEAD  Findings:  There is no evidence for acute infarction, intracranial hemorrhage, mass lesion, hydrocephalus, or extra-axial fluid. There is no significant atrophy or large vessel infarct.  Scattered white matter foci consistent with chronic microvascular ischemic change affecting periventricular and subcortical white matter. There are no foci of chronic hemorrhage.  No midline shift.  No osseous findings.  Extracranial soft tissues unremarkable. Compared with prior CT, the appearance is similar.  Compared  with prior MR, there has been mild progression of small vessel disease.  IMPRESSION: No acute intracranial findings.  Mild small vessel disease.  MRA HEAD  Findings: A primitive carotid to basilar communication, persistent trigeminal artery, is present on the right.  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  Both internal carotid arteries widely patent.  The basilar is hypoplastic but there is no intrinsic atherosclerotic change noted. Contribution of the posterior circulation is also provided by the fairly prominent right posterior communicating artery.   There is no proximal stenosis of the anterior, middle, or posterior cerebral arteries.   No visible cerebellar branch occlusion.  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  No proximal intracranial stenosis.  Similar appearance to priors.   Original Report Authenticated By: Rolla Flatten, M.D.   Mr Brain Wo  Contrast  07/10/2013   *RADIOLOGY REPORT*  Clinical Data:  Possible stroke.  Left-sided weakness.  Headache.  MRI HEAD WITHOUT CONTRAST MRA HEAD WITHOUT CONTRAST  Technique:  Multiplanar, multiecho pulse sequences of the brain and surrounding structures were obtained without intravenous contrast. Angiographic images of the head were obtained using MRA technique without contrast.  Comparison:  CT head earlier today.  Prior MRI and the catheter angiogram from 2008 and 2010.  MRI HEAD  Findings:  There is no evidence for acute infarction, intracranial hemorrhage, mass lesion, hydrocephalus, or extra-axial fluid. There is no significant atrophy or large vessel infarct.  Scattered white matter foci consistent with chronic microvascular ischemic change affecting periventricular and subcortical white matter. There are no foci of chronic hemorrhage.  No midline shift.  No osseous findings.  Extracranial soft tissues unremarkable. Compared with prior CT, the appearance is similar.  Compared with prior MR, there has been mild progression of small vessel disease.  IMPRESSION: No acute intracranial findings.  Mild small vessel disease.  MRA HEAD  Findings: A primitive carotid to basilar communication, persistent trigeminal artery, is present on the right.  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  Both internal carotid arteries widely patent.  The basilar is hypoplastic but there is no intrinsic atherosclerotic change noted. Contribution of the posterior circulation is also provided by the fairly prominent right posterior communicating artery.   There is no proximal stenosis of the anterior, middle, or posterior cerebral arteries.   No visible cerebellar branch occlusion.  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  No proximal intracranial stenosis.  Similar appearance to priors.   Original Report Authenticated By: Rolla Flatten, M.D.   US Carotid Duplex Bilateral  07/11/2013   *RADIOLOGY REPORT*  Clinical Data: Stroke symptoms, smoker  BILATERAL CAROTID DUPLEX ULTRASOUND  Technique: Pearline Cables scale imaging, color Doppler and duplex ultrasound were performed of bilateral carotid and vertebral arteries in the neck.  Comparison:  None.  Criteria:  Quantification of carotid stenosis is based on velocity parameters that correlate the residual internal carotid diameter with NASCET-based stenosis levels, using the diameter of the distal internal carotid lumen as the denominator for stenosis measurement.  The following velocity measurements were obtained:                   PEAK SYSTOLIC/END DIASTOLIC RIGHT ICA:  88/44cm/sec CCA:                        99/24QA/STM SYSTOLIC ICA/CCA RATIO:     1.96 DIASTOLIC ICA/CCA RATIO:    1.77 ECA:                        81cm/sec  LEFT ICA:                        95/42cm/sec CCA:                        22/29NL/GXQ SYSTOLIC ICA/CCA RATIO:     1.19 DIASTOLIC ICA/CCA RATIO:    1.69 ECA:                        85cm/sec  Findings:  RIGHT CAROTID ARTERY: Very minor atherosclerotic changes.  No hemodynamically significant right ICA stenosis, velocity elevation, or turbulent flow.  RIGHT VERTEBRAL ARTERY:  Antegrade  LEFT CAROTID ARTERY: Minor atherosclerotic changes.  No hemodynamically significant left ICA stenosis, velocity elevation, or turbulent flow.  LEFT VERTEBRAL ARTERY:  Antegrade  IMPRESSION: No hemodynamically significant ICA stenosis   Original Report Authenticated By: Jerilynn Mages. Shick, M.D.   Labs: Basic Metabolic Panel:  Recent Labs Lab 07/10/13 1650  NA 138  K 3.7  CL 101  CO2 27  GLUCOSE 116*  BUN 9  CREATININE 0.71  CALCIUM 9.4   Liver Function Tests:  Recent Labs Lab 07/10/13 1650  AST 24  ALT 8  ALKPHOS 70  BILITOT 0.2*  PROT 7.6  ALBUMIN 4.0   CBC:  Recent Labs Lab 07/10/13 1650  WBC 8.1  NEUTROABS 5.4  HGB 13.2  HCT 39.4  MCV 91.6  PLT 286    Cardiac Enzymes:  Recent Labs Lab 07/10/13 1650  TROPONINI <0.30     Signed:  Marzetta Board  [facility] 07/12/2013, 3:26 PM

## 2013-07-18 DIAGNOSIS — Z8673 Personal history of transient ischemic attack (TIA), and cerebral infarction without residual deficits: Secondary | ICD-10-CM | POA: Insufficient documentation

## 2013-07-30 ENCOUNTER — Telehealth: Payer: Self-pay | Admitting: Internal Medicine

## 2013-07-30 ENCOUNTER — Encounter: Payer: Self-pay | Admitting: *Deleted

## 2013-07-30 NOTE — Telephone Encounter (Signed)
No charge, please take her off the schedule so we can use her spot. No reschedule, will need an appointment before rescheduling.

## 2013-07-31 NOTE — Telephone Encounter (Signed)
Pt is sch'd for a Followup appt on 09-11-13 w/Dr. Olevia Perches

## 2013-08-01 ENCOUNTER — Encounter: Payer: BC Managed Care – PPO | Admitting: Internal Medicine

## 2013-09-11 ENCOUNTER — Ambulatory Visit (INDEPENDENT_AMBULATORY_CARE_PROVIDER_SITE_OTHER): Payer: BC Managed Care – PPO | Admitting: Internal Medicine

## 2013-09-11 ENCOUNTER — Encounter: Payer: Self-pay | Admitting: Internal Medicine

## 2013-09-11 VITALS — BP 110/70 | HR 80 | Ht 63.0 in | Wt 120.1 lb

## 2013-09-11 DIAGNOSIS — R1013 Epigastric pain: Secondary | ICD-10-CM

## 2013-09-11 DIAGNOSIS — K5100 Ulcerative (chronic) pancolitis without complications: Secondary | ICD-10-CM

## 2013-09-11 MED ORDER — SERTRALINE HCL 50 MG PO TABS: ORAL_TABLET | ORAL | Status: DC

## 2013-09-11 MED ORDER — FOLIC ACID 1 MG PO TABS: 1.0000 mg | ORAL_TABLET | Freq: Every day | ORAL | Status: DC

## 2013-09-11 MED ORDER — SULFASALAZINE 500 MG PO TABS: ORAL_TABLET | ORAL | Status: DC

## 2013-09-11 NOTE — Progress Notes (Signed)
Stacy Hansen October 22, 1951 MRN [id number]  History of Present Illness:  This is a 62 year old white female with a history of inflammatory bowel disease diagnosed in 1997. She was  followed by several gastroenterologists. Her last appointment with [place] in 07/05/2013. Her last colonoscopy in August of 2013 showed a hyperplastic polyp and active focal colitis. Her terminal ileum was normal. She was on a prednisone taper and was most recently on mesalamine 3.6 g but could not afford it. She has nausea and reflux symptoms. She works part-time at [employer] which is very difficult for her because of anxiety and depression. She is on lorazepam 0.5 mg on an as necessary basis. She had  diarrhea last night and also episode of  vomiting this morning. She was hospitalized recently for headaches and associated left-sided weakness and was told to have a TIA. She is now on aspirin 81 mg daily. Her CT scan of the head was negative. The diagnosis was not very clear and included  possibility of complex seizures or migraine related neurological symptoms. Patient has been under a lot of stress due to anxiety.   Past Medical History  Diagnosis Date  . Atypical chest pain   . Tobacco abuse   . History of fibromyalgia   . Crohn disease   . Depression   . Anxiety   . Brain aneurysm   . Hyperplastic colon polyp   . GERD (gastroesophageal reflux disease)   . Panic attacks   . Stroke    Past Surgical History  Procedure Laterality Date  . Orif ulnar fracture      left   . Rotator cuff repair      left  . Vaginal hysterectomy      has her ovaries  . Tubal ligation    . Tonsillectomy    . Cataract extraction      bilateral  . Vein removed      right varicose vein    reports that she has been smoking Cigarettes.  She has been smoking about 0.00 packs per day. She has never used smokeless tobacco. She reports that she does not drink alcohol or use illicit drugs. family history includes Colon cancer in her  brother; Diabetes in her brother, mother, and sister; Kidney failure in her mother; Stroke in her father. Allergies  Allergen Reactions  . Ciprofloxacin Other (See Comments)    Unknown reaction per pt  . Meperidine Hcl Nausea And Vomiting and Other (See Comments)    Causes blood pressure to drop when taken with Phenergan through IV  . Promethazine Hcl Nausea And Vomiting and Other (See Comments)    Causes blood pressure to drop when taken with Demeral via IV  . Propoxyphene-Acetaminophen Other (See Comments)    "pass out"        Review of Systems: Nausea, subxiphoid tenderness, diarrhea  The remainder of the 10 point ROS is negative except as outlined in H&P   Physical Exam: General appearance  Well developed, in no distress. Eyes- non icteric. HEENT nontraumatic, normocephalic. Mouth no lesions, tongue papillated, no cheilosis. Neck supple without adenopathy, thyroid not enlarged, no carotid bruits, no JVD. Lungs Clear to auscultation bilaterally. Cor normal S1, normal S2, regular rhythm, no murmur,  quiet precordium. Abdomen: Tenderness in right lower quadrant and left lower quadrant. Epigastric tenderness. Normoactive bowel sounds. Rectal: Soft Hemoccult negative stool. Extremities no pedal edema. Skin no lesions. Neurological alert and oriented x 3. Psychological normal mood and affect.  Assessment and Plan:  Problem #  74 62 year old white female with inflammatory bowel disease and irritable bowel syndrome. She ran out of mesalamine and could not afford it. We will put her on sulfasalazine 500 mg 2 tablets twice a day and folic acid 1 mg daily.  Problem #2 Anxiety and depression. We will start Zoloft 25 mg daily for 2 weeks then 50 mg daily. I will see her in 6-8 weeks for nausea. She was scheduled for an upper endoscopy but canceled. She will continue omeprazole 20 mg daily.I am hesitant to put her on immuno modulators or biologicals due to her past history of non compliance  and switching  doctors.    09/11/2013 Delfin Edis

## 2013-09-11 NOTE — Patient Instructions (Addendum)
We have sent the following medications to your pharmacy for you to pick up at your convenience: Zoloft 50 mg--Take 1/2 tablet by mouth every night x 2 weeks, then increase to 1 tablet by mouth every night thereafter. Azulfadine 500 mg--Take 2 tablets by mouth twice daily. Folic Acid 1 mg--Take 1 tablet by mouth once daily.  Cc: Dr Vicenta Aly

## 2013-10-23 ENCOUNTER — Emergency Department (HOSPITAL_COMMUNITY): Payer: BC Managed Care – PPO

## 2013-10-23 ENCOUNTER — Emergency Department (HOSPITAL_COMMUNITY)
Admission: EM | Admit: 2013-10-23 | Discharge: 2013-10-23 | Disposition: A | Discharge status: Home / Self Care | Payer: BC Managed Care – PPO | Attending: Emergency Medicine | Admitting: Emergency Medicine

## 2013-10-23 ENCOUNTER — Encounter (HOSPITAL_COMMUNITY): Payer: Self-pay | Admitting: Emergency Medicine

## 2013-10-23 DIAGNOSIS — R110 Nausea: Secondary | ICD-10-CM | POA: Insufficient documentation

## 2013-10-23 DIAGNOSIS — Z7982 Long term (current) use of aspirin: Secondary | ICD-10-CM | POA: Insufficient documentation

## 2013-10-23 DIAGNOSIS — R079 Chest pain, unspecified: Secondary | ICD-10-CM | POA: Insufficient documentation

## 2013-10-23 DIAGNOSIS — M545 Low back pain, unspecified: Secondary | ICD-10-CM | POA: Insufficient documentation

## 2013-10-23 DIAGNOSIS — I69998 Other sequelae following unspecified cerebrovascular disease: Secondary | ICD-10-CM | POA: Insufficient documentation

## 2013-10-23 DIAGNOSIS — Z8673 Personal history of transient ischemic attack (TIA), and cerebral infarction without residual deficits: Secondary | ICD-10-CM | POA: Insufficient documentation

## 2013-10-23 DIAGNOSIS — Z79899 Other long term (current) drug therapy: Secondary | ICD-10-CM | POA: Insufficient documentation

## 2013-10-23 DIAGNOSIS — F410 Panic disorder [episodic paroxysmal anxiety] without agoraphobia: Secondary | ICD-10-CM | POA: Insufficient documentation

## 2013-10-23 DIAGNOSIS — IMO0002 Reserved for concepts with insufficient information to code with codable children: Secondary | ICD-10-CM | POA: Insufficient documentation

## 2013-10-23 DIAGNOSIS — R350 Frequency of micturition: Secondary | ICD-10-CM | POA: Insufficient documentation

## 2013-10-23 DIAGNOSIS — H9209 Otalgia, unspecified ear: Secondary | ICD-10-CM | POA: Insufficient documentation

## 2013-10-23 DIAGNOSIS — R42 Dizziness and giddiness: Secondary | ICD-10-CM | POA: Insufficient documentation

## 2013-10-23 DIAGNOSIS — K219 Gastro-esophageal reflux disease without esophagitis: Secondary | ICD-10-CM | POA: Insufficient documentation

## 2013-10-23 DIAGNOSIS — Z860100 Personal history of colon polyps, unspecified: Secondary | ICD-10-CM | POA: Insufficient documentation

## 2013-10-23 DIAGNOSIS — F17200 Nicotine dependence, unspecified, uncomplicated: Secondary | ICD-10-CM | POA: Insufficient documentation

## 2013-10-23 DIAGNOSIS — Z720 Tobacco use: Secondary | ICD-10-CM

## 2013-10-23 DIAGNOSIS — J40 Bronchitis, not specified as acute or chronic: Secondary | ICD-10-CM | POA: Insufficient documentation

## 2013-10-23 DIAGNOSIS — R1032 Left lower quadrant pain: Secondary | ICD-10-CM | POA: Insufficient documentation

## 2013-10-23 DIAGNOSIS — M6281 Muscle weakness (generalized): Secondary | ICD-10-CM | POA: Insufficient documentation

## 2013-10-23 DIAGNOSIS — R1012 Left upper quadrant pain: Secondary | ICD-10-CM | POA: Insufficient documentation

## 2013-10-23 LAB — CBC WITH DIFFERENTIAL/PLATELET
Eosinophils Absolute: 0.1 10*3/uL (ref 0.0–0.7)
Eosinophils Relative: 2 % (ref 0–5)
HCT: 41.5 % (ref 36.0–46.0)
Lymphocytes Relative: 19 % (ref 12–46)
Lymphs Abs: 1.2 10*3/uL (ref 0.7–4.0)
MCH: 30.4 pg (ref 26.0–34.0)
MCV: 92.6 fL (ref 78.0–100.0)
Monocytes Absolute: 0.6 10*3/uL (ref 0.1–1.0)
Monocytes Relative: 9 % (ref 3–12)
RBC: 4.48 MIL/uL (ref 3.87–5.11)
WBC: 6.5 10*3/uL (ref 4.0–10.5)

## 2013-10-23 LAB — BASIC METABOLIC PANEL
BUN: 8 mg/dL (ref 6–23)
CO2: 30 mEq/L (ref 19–32)
Calcium: 9.5 mg/dL (ref 8.4–10.5)
Creatinine, Ser: 0.76 mg/dL (ref 0.50–1.10)
Glucose, Bld: 113 mg/dL — ABNORMAL HIGH (ref 70–99)

## 2013-10-23 LAB — D-DIMER, QUANTITATIVE: D-Dimer, Quant: 0.32 ug/mL-FEU (ref 0.00–0.48)

## 2013-10-23 MED ORDER — IPRATROPIUM BROMIDE 0.02 % IN SOLN
0.5000 mg | Freq: Once | RESPIRATORY_TRACT | Status: AC
  Administered 2013-10-23: 0.5 mg via RESPIRATORY_TRACT
  Filled 2013-10-23: qty 2.5

## 2013-10-23 MED ORDER — HYDROCODONE-ACETAMINOPHEN 5-325 MG PO TABS
1.0000 | ORAL_TABLET | Freq: Once | ORAL | Status: AC
  Administered 2013-10-23: 1 via ORAL
  Filled 2013-10-23: qty 1

## 2013-10-23 MED ORDER — PREDNISONE 50 MG PO TABS
60.0000 mg | ORAL_TABLET | Freq: Once | ORAL | Status: AC
  Administered 2013-10-23: 60 mg via ORAL
  Filled 2013-10-23 (×2): qty 1

## 2013-10-23 MED ORDER — PREDNISONE 20 MG PO TABS: 20.0000 mg | ORAL_TABLET | Freq: Two times a day (BID) | ORAL | Status: DC

## 2013-10-23 MED ORDER — AEROCHAMBER Z-STAT PLUS/MEDIUM MISC
1.0000 | Freq: Once | Status: DC
  Filled 2013-10-23: qty 1

## 2013-10-23 MED ORDER — ALBUTEROL SULFATE (5 MG/ML) 0.5% IN NEBU
5.0000 mg | INHALATION_SOLUTION | Freq: Once | RESPIRATORY_TRACT | Status: AC
  Administered 2013-10-23: 5 mg via RESPIRATORY_TRACT
  Filled 2013-10-23: qty 1

## 2013-10-23 MED ORDER — ALBUTEROL SULFATE HFA 108 (90 BASE) MCG/ACT IN AERS
2.0000 | INHALATION_SPRAY | RESPIRATORY_TRACT | Status: DC | PRN
  Administered 2013-10-23: 2 via RESPIRATORY_TRACT
  Filled 2013-10-23: qty 6.7

## 2013-10-23 NOTE — ED Notes (Signed)
RT aware of inhaler.

## 2013-10-23 NOTE — ED Notes (Signed)
Pt ambulated to bathroom slowly but without difficulty or assistance.

## 2013-10-23 NOTE — ED Notes (Signed)
Pt " a little dizzy" during walk with hand holding assistance.

## 2013-10-23 NOTE — ED Notes (Signed)
Pt low BP 88/45, 87/42, 94/38 - Lying.  92/44 - sitting. 98/60 - standing.

## 2013-10-23 NOTE — ED Notes (Signed)
Pt expressed desire for Dr to order same type of breathing had at 1410 to be able to do at home.

## 2013-10-23 NOTE — ED Notes (Signed)
Chest pain , onset 1 hour pta. Recently dx with bronchitis.  Increased pain with movement.  Nausea, feels faint at times

## 2013-10-23 NOTE — ED Provider Notes (Signed)
CSN: [id number]     Arrival date & time 10/23/13  1241 History  This chart was scribed for Richarda Blade, MD by Eugenia Mcalpine, ED Scribe. This patient was seen in room APA04/APA04 and the patient's care was started at 1:21 PM.    Chief Complaint  Patient presents with  . Chest Pain    The history is provided by the patient. No language interpreter was used.   HPI Comments: Stacy Hansen is a 62 y.o. female with a hx of stroke who presents to the Emergency Department complaining of sudden onset dizziness described as a "weird feeling" with associated nausea and bilateral otalgia. She started having intermittent CP described as an 8/10 stabbing sensation that started as a fluttering sensation and radiates to her left arm onset 1x hour ago. She reports that moving her left arm makes the pain worse.  Pt has taken Alka Seltzer Cold Plus and Robitussin DM with no improvement.  She denies vomiting and diaphoresis. Pt was diagnosed with bronchitis 1x week ago by her PCP that she had for 3x days prior to being seen. She was given a steroid injection and inhaler with mild improvement. 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   PCP: Dr. Ouida Sills  Past Medical History  Diagnosis Date  . Atypical chest pain   . Tobacco abuse   . History of fibromyalgia   . Crohn disease   . Depression   . Anxiety   . Brain aneurysm   . Hyperplastic colon polyp   . GERD (gastroesophageal reflux disease)   . Panic attacks   . Stroke    Past Surgical History  Procedure Laterality Date  . Orif ulnar fracture      left   . Rotator cuff repair      left  . Vaginal hysterectomy      has her ovaries  . Tubal ligation    . Tonsillectomy    . Cataract extraction      bilateral  . Vein removed      right varicose vein   Family History  Problem Relation Age of Onset  . Diabetes Mother   . Kidney failure Mother   . Diabetes Brother   . Stroke Father   .  Diabetes Sister     x 2  . Colon cancer Brother     in his 50's   History  Substance Use Topics  . Smoking status: Current Every Day Smoker    Types: Cigarettes  . Smokeless tobacco: Never Used     Comment: one pack/ 3 days, quit for a year, Tobacco info given 03/07/13  . Alcohol Use: No   No OB history provided.  Review of Systems  Constitutional: Negative for fever and chills.  Respiratory: Positive for cough. Negative for shortness of breath.   Cardiovascular: Positive for chest pain.  Genitourinary: Positive for frequency.  Musculoskeletal: Positive for back pain.  All other systems reviewed and are negative.    Allergies  Ciprofloxacin; Meperidine hcl; Opium; Promethazine hcl; and Propoxyphene-acetaminophen  Home Medications   Current Outpatient Rx  Name  Route  Sig  Dispense  Refill  . albuterol (VENTOLIN HFA) 108 (90 BASE) MCG/ACT inhaler   Inhalation   Inhale 2 puffs into the lungs every 6 (six) hours as needed for wheezing or shortness of breath.          Marland Kitchen aspirin EC 81 MG tablet   Oral  Take 1 tablet (81 mg total) by mouth daily.   30 tablet   2   . carboxymethylcellulose (REFRESH PLUS) 0.5 % SOLN   Both Eyes   Place 1 drop into both eyes 3 (three) times daily as needed (dry eyes).          Marland Kitchen dicyclomine (BENTYL) 10 MG capsule   Oral   Take 1 capsule (10 mg total) by mouth 2 (two) times daily.   60 capsule   1   . doxycycline (VIBRA-TABS) 100 MG tablet   Oral   Take 100 mg by mouth 2 (two) times daily. For 10 days, started on 79/3         . folic acid (FOLVITE) 1 MG tablet   Oral   Take 1 tablet (1 mg total) by mouth daily.   100 tablet   1   . guaiFENesin-dextromethorphan (ROBITUSSIN DM) 100-10 MG/5ML syrup   Oral   Take 5 mLs by mouth daily as needed for cough.         Marland Kitchen LORazepam (ATIVAN) 0.5 MG tablet   Oral   Take 1 tablet (0.5 mg total) by mouth daily as needed. For anxiety   15 tablet   0   . omeprazole (PRILOSEC) 20 MG  capsule   Oral   Take 1 capsule (20 mg total) by mouth daily.   30 capsule   1   . ondansetron (ZOFRAN) 4 MG tablet   Oral   Take 4 mg by mouth every 8 (eight) hours as needed. nausea         . Phenyleph-Doxylamine-DM-APAP (ALKA-SELTZER PLS ALLERGY & CGH PO)   Oral   Take 1 packet by mouth daily as needed.         . sulfaSALAzine (AZULFIDINE) 500 MG tablet      Take 2 tablets by mouth twice daily   120 tablet   2   . meclizine (ANTIVERT) 25 MG tablet   Oral   Take 25 mg by mouth every 8 (eight) hours as needed (vertigo). Nausea         . predniSONE (DELTASONE) 20 MG tablet   Oral   Take 1 tablet (20 mg total) by mouth 2 (two) times daily.   10 tablet   0    Triage Vitals: BP 111/43  Pulse 90  Temp(Src) 98.5 F (36.9 C) (Oral)  Resp 20  Ht 5' 3"  (1.6 m)  Wt 122 lb (55.339 kg)  BMI 21.62 kg/m2  SpO2 100%  Physical Exam  Nursing note and vitals reviewed. Constitutional: She is oriented to person, place, and time. She appears well-developed and well-nourished.  HENT:  Head: Normocephalic and atraumatic.  Eyes: Conjunctivae and EOM are normal. Pupils are equal, round, and reactive to light.  Neck: Normal range of motion and phonation normal. Neck supple.  Cardiovascular: Normal rate, regular rhythm and intact distal pulses.   Pulmonary/Chest: Effort normal and breath sounds normal. She exhibits no tenderness.  Abdominal: Soft. She exhibits no distension. There is tenderness (mild LLQ and LUQ tenderness ). There is no guarding.  Musculoskeletal: Normal range of motion.  Neurological: She is alert and oriented to person, place, and time. She exhibits normal muscle tone.  Skin: Skin is warm and dry.  Psychiatric: She has a normal mood and affect. Her behavior is normal. Judgment and thought content normal.    ED Course  Procedures (including critical care time)  Medications  albuterol (PROVENTIL HFA;VENTOLIN HFA) 108 (90 BASE) MCG/ACT  inhaler 2 puff (not  administered)  aerochamber Z-Stat Plus/medium 1 each (not administered)  predniSONE (DELTASONE) tablet 60 mg (not administered)  albuterol (PROVENTIL) (5 MG/ML) 0.5% nebulizer solution 5 mg (5 mg Nebulization Given 10/23/13 1409)  ipratropium (ATROVENT) nebulizer solution 0.5 mg (0.5 mg Nebulization Given 10/23/13 1409)  HYDROcodone-acetaminophen (NORCO/VICODIN) 5-325 MG per tablet 1 tablet (1 tablet Oral Given 10/23/13 1401)    DIAGNOSTIC STUDIES: Oxygen Saturation is 100% on RA, normal by my interpretation.    COORDINATION OF CARE:  1:29 PM- Pt advised of plan for treatment including chest X-ray, UA, blood work, pain medication and a breathing treatment and pt agrees.   Patient Vitals for the past 24 hrs:  BP Temp Temp src Pulse Resp SpO2 Height Weight  10/23/13 1820 95/50 mmHg - - 77 20 98 % - -  10/23/13 1649 96/43 mmHg - - 70 - 98 % - -  10/23/13 1433 111/63 mmHg 98.1 F (36.7 C) Oral 78 18 96 % - -  10/23/13 1410 - - - - - 98 % - -  10/23/13 1245 111/43 mmHg 98.5 F (36.9 C) Oral 90 20 100 % 5' 3"  (1.6 m) 122 lb (55.339 kg)    6:22 PM Reevaluation with update and discussion. After initial assessment and treatment, an updated evaluation reveals patient states that she feels better now. She feels like the nebulizer has improved her ability to breathe, as well as taking care of the pressure sensation in her chest. She would like a nebulizer to use at home. I explained to her that her PCP would be the one to order that. In the meantime, she can use a bronchodilator inhaler, as currently prescribed. All questions were answered. She's been able ambulate here without dizziness or weakness. Blimy Napoleon L   EKG Interpretation    Date/Time:  Tuesday October 23 2013 12:48:29 EST Ventricular Rate:  76 PR Interval:  140 QRS Duration: 80 QT Interval:  392 QTC Calculation: 441 R Axis:   65 Text Interpretation:  Normal sinus rhythm Normal ECG When compared with ECG of 10-Jul-2013 16:36,  No significant change was found Confirmed by Ruperto Kiernan  MD, Meera Vasco ([id number]) on 10/23/2013 5:12:57 PM            MDM   1. Bronchitis   2. Tobacco abuse   3. Nonspecific chest pain      Nonspecific chest pain, atypical for coronary artery disease. I believe that this pain is the result of bronchitis, and COPD. The patient continues to smoke. She is trying to stop. I supported her efforts to do that. She has marginal blood pressure, however, she typically has low blood pressure, as evidenced by prior records; at the patient's discharge from the hospital 07/11/2013, her blood pressures that day were 99/63, and 94/54. Doubt metabolic instability, serious bacterial infection or impending vascular collapse; the patient is stable for discharge.  Nursing Notes Reviewed/ Care Coordinated, and agree without changes. Applicable Imaging Reviewed.  Interpretation of Laboratory Data incorporated into ED treatment   Plan: Home Medications- prednisone, and albuterol inhaler; Home Treatments and Observation- rest, avoid tobacco products; return here if the recommended treatment, does not improve the symptoms; Recommended follow up- PCP check up 1 week    I personally performed the services described in this documentation, which was scribed in my presence. The recorded information has been reviewed and is accurate.     Richarda Blade, MD 10/23/13 [phone]

## 2013-11-21 ENCOUNTER — Ambulatory Visit (INDEPENDENT_AMBULATORY_CARE_PROVIDER_SITE_OTHER): Payer: BC Managed Care – PPO | Admitting: Internal Medicine

## 2013-11-21 ENCOUNTER — Other Ambulatory Visit (INDEPENDENT_AMBULATORY_CARE_PROVIDER_SITE_OTHER): Payer: BC Managed Care – PPO

## 2013-11-21 ENCOUNTER — Encounter: Payer: Self-pay | Admitting: Internal Medicine

## 2013-11-21 VITALS — BP 110/64 | HR 76 | Ht 63.0 in | Wt 121.0 lb

## 2013-11-21 DIAGNOSIS — R1013 Epigastric pain: Secondary | ICD-10-CM

## 2013-11-21 LAB — CBC WITH DIFFERENTIAL/PLATELET
BASOS ABS: 0 10*3/uL (ref 0.0–0.1)
Basophils Relative: 0.6 % (ref 0.0–3.0)
Eosinophils Absolute: 0.1 10*3/uL (ref 0.0–0.7)
Eosinophils Relative: 2 % (ref 0.0–5.0)
HCT: 42.3 % (ref 36.0–46.0)
Hemoglobin: 14.2 g/dL (ref 12.0–15.0)
LYMPHS ABS: 2.1 10*3/uL (ref 0.7–4.0)
Lymphocytes Relative: 30.3 % (ref 12.0–46.0)
MCHC: 33.6 g/dL (ref 30.0–36.0)
MCV: 90.9 fl (ref 78.0–100.0)
MONOS PCT: 7.3 % (ref 3.0–12.0)
Monocytes Absolute: 0.5 10*3/uL (ref 0.1–1.0)
NEUTROS PCT: 59.8 % (ref 43.0–77.0)
Neutro Abs: 4.2 10*3/uL (ref 1.4–7.7)
PLATELETS: 297 10*3/uL (ref 150.0–400.0)
RBC: 4.66 Mil/uL (ref 3.87–5.11)
RDW: 14.1 % (ref 11.5–14.6)
WBC: 7 10*3/uL (ref 4.5–10.5)

## 2013-11-21 LAB — COMPREHENSIVE METABOLIC PANEL
ALBUMIN: 4.6 g/dL (ref 3.5–5.2)
ALT: 13 U/L (ref 0–35)
AST: 21 U/L (ref 0–37)
Alkaline Phosphatase: 57 U/L (ref 39–117)
BILIRUBIN TOTAL: 0.5 mg/dL (ref 0.3–1.2)
BUN: 9 mg/dL (ref 6–23)
CO2: 29 meq/L (ref 19–32)
Calcium: 9.7 mg/dL (ref 8.4–10.5)
Chloride: 104 mEq/L (ref 96–112)
Creatinine, Ser: 0.9 mg/dL (ref 0.4–1.2)
GFR: 69.15 mL/min (ref >60.00)
GLUCOSE: 99 mg/dL (ref 70–99)
POTASSIUM: 5.1 meq/L (ref 3.5–5.1)
SODIUM: 140 meq/L (ref 135–145)
TOTAL PROTEIN: 7.3 g/dL (ref 6.0–8.3)

## 2013-11-21 LAB — TSH: TSH: 1.33 u[IU]/mL (ref 0.35–5.50)

## 2013-11-21 LAB — SEDIMENTATION RATE: Sed Rate: 9 mm/hr (ref 0–22)

## 2013-11-21 MED ORDER — OMEPRAZOLE 20 MG PO CPDR: 20.0000 mg | DELAYED_RELEASE_CAPSULE | Freq: Two times a day (BID) | ORAL | Status: DC

## 2013-11-21 MED ORDER — ESTROGENS CONJUGATED 0.3 MG PO TABS: ORAL_TABLET | ORAL | Status: DC

## 2013-11-21 NOTE — Progress Notes (Signed)
Stacy Hansen [facility] June 20, 1951 [id number]   History of Present Illness: This is a 63 year old white female with history of inflammatory bowel disease since 1997 seen by several gastroenterologists.in the past. Last colonoscopy in August 2013 showed focal colitis and hyperplastic polyps. She has been on Azulfidine  1 g twice a day and is having  normal bowel movements. Her main complaint today  is epigastric pain nausea and decreased level of energy. She works full time at [employer] on second shift 1 PM to 8 PM. She is thinking about going part-time because of fatigue. Her appetite has been decreased. She was scheduled for upper endoscopy after last appointment in October 2014 but had to cancel because she was hospitalized for  suspected stroke/TIA. Neurological workup was negative.    Past Medical History  Diagnosis Date  . Atypical chest pain   . Tobacco abuse   . History of fibromyalgia   . Crohn disease   . Depression   . Anxiety   . Brain aneurysm   . Hyperplastic colon polyp   . GERD (gastroesophageal reflux disease)   . Panic attacks   . Stroke     Past Surgical History  Procedure Laterality Date  . Orif ulnar fracture      left   . Rotator cuff repair      left  . Vaginal hysterectomy      has her ovaries  . Tubal ligation    . Tonsillectomy    . Cataract extraction      bilateral  . Vein removed      right varicose vein    Allergies  Allergen Reactions  . Ciprofloxacin Other (See Comments)    Unknown reaction per pt  . Meperidine Hcl Nausea And Vomiting and Other (See Comments)    Causes blood pressure to drop when taken with Phenergan through IV  . Opium Other (See Comments)    Causes blood pressure to drop when taken with Phenergan through IV  . Promethazine Hcl Nausea And Vomiting and Other (See Comments)    Causes blood pressure to drop when taken with Demeral via IV  . Propoxyphene N-Acetaminophen Other (See Comments)    "pass out"    Family history and  social history have been reviewed.  Review of Systems: Denies dysphagia. Positive for occasional heartburn. Nausea no vomiting  The remainder of the 10 point ROS is negative except as outlined in the H&P  Physical Exam: General Appearance Well developed, in no distress Eyes  Non icteric  HEENT  Non traumatic, normocephalic  Mouth No lesion, tongue papillated, no cheilosis Neck Supple without adenopathy, thyroid not enlarged, no carotid bruits, no JVD Lungs Clear to auscultation bilaterally COR Normal S1, normal S2, regular rhythm, no murmur, quiet precordium Abdomen very tender epigastrium and subxiphoid area. Normal active bowel sounds. No distention. Liver edge at costal margin. Lower abdomen normal Rectal not done Extremities  No pedal edema Skin No lesions Neurological Alert and oriented x 3 Psychological Normal mood and affect  Assessment and Plan:   63 year old white female with the several multi-systemic complaints. She has inflammatory bowel disease which is under good control. She is complaining of decreased energy level. We will check her CBC, TSH, sedimentation rate and metabolic panel. She does not want to undergo an upper endoscopy so we will go with upper GI series to look for an ulcer. We will increase her Prilosec to 20 mg twice a day. I will give a trial of low-dose estrogen Premarin  0.3 in milligrams daily for hot flashes due to estrogen deficiency trial of 3 months.. Another possibility would starting SSRI.which could also help with hot flashes.    Delfin Edis 11/21/2013

## 2013-11-21 NOTE — Patient Instructions (Addendum)
Your physician has requested that you go to the basement for the following lab work before leaving today: TSH, CBC, CMET, Sed Rate  We have sent the following medications to your pharmacy for you to pick up at your convenience: Prilosec 20 twice daily Premarin  You have been scheduled for an Upper GI Series at [facility]. Your appointment is on Wednesay 11/28/13  at 9:30 am. Please arrive 15 minutes prior to your test for registration. Make sure not to eat or drink anything after midnight on the night before your test. If you need to reschedule, please call radiology at [phone]. ________________________________________________________________ An upper GI series uses x rays to help diagnose problems of the upper GI tract, which includes the esophagus, stomach, and duodenum. The duodenum is the first part of the small intestine. An upper GI series is conducted by a radiology technologist or a radiologist-a doctor who specializes in x-ray imaging-at a hospital or outpatient center. While sitting or standing in front of an x-ray machine, the patient drinks barium liquid, which is often white and has a chalky consistency and taste. The barium liquid coats the lining of the upper GI tract and makes signs of disease show up more clearly on x rays. X-ray video, called fluoroscopy, is used to view the barium liquid moving through the esophagus, stomach, and duodenum. Additional x rays and fluoroscopy are performed while the patient lies on an x-ray table. To fully coat the upper GI tract with barium liquid, the technologist or radiologist may press on the abdomen or ask the patient to change position. Patients hold still in various positions, allowing the technologist or radiologist to take x rays of the upper GI tract at different angles. If a technologist conducts the upper GI series, a radiologist will later examine the images to look for problems.  This test typically takes about 1 hour to  complete. __________________________________________________________________ CC: Dr Vicenta Aly

## 2013-11-26 ENCOUNTER — Ambulatory Visit (HOSPITAL_COMMUNITY): Payer: BC Managed Care – PPO

## 2013-11-28 ENCOUNTER — Ambulatory Visit (HOSPITAL_COMMUNITY)
Admission: RE | Admit: 2013-11-28 | Discharge: 2013-11-28 | Disposition: A | Discharge status: Home / Self Care | Payer: BC Managed Care – PPO | Source: Ambulatory Visit | Attending: Internal Medicine | Admitting: Internal Medicine

## 2013-11-28 DIAGNOSIS — R1013 Epigastric pain: Secondary | ICD-10-CM | POA: Insufficient documentation

## 2013-11-28 DIAGNOSIS — K449 Diaphragmatic hernia without obstruction or gangrene: Secondary | ICD-10-CM | POA: Insufficient documentation

## 2013-11-28 DIAGNOSIS — R630 Anorexia: Secondary | ICD-10-CM | POA: Insufficient documentation

## 2013-11-28 DIAGNOSIS — R634 Abnormal weight loss: Secondary | ICD-10-CM | POA: Insufficient documentation

## 2013-11-28 DIAGNOSIS — K219 Gastro-esophageal reflux disease without esophagitis: Secondary | ICD-10-CM | POA: Insufficient documentation

## 2013-12-07 ENCOUNTER — Telehealth: Payer: Self-pay | Admitting: *Deleted

## 2013-12-07 NOTE — Telephone Encounter (Signed)
Patient is wondering when she needs to have f/u for her Crohn's. May leave message on voice mail.Please, advise.

## 2013-12-07 NOTE — Telephone Encounter (Signed)
I will see her in 3 months.

## 2013-12-07 NOTE — Telephone Encounter (Signed)
Spoke with patient and she will call to schedule this in Feb.

## 2014-02-19 ENCOUNTER — Encounter (HOSPITAL_COMMUNITY): Payer: Self-pay | Admitting: Emergency Medicine

## 2014-02-19 ENCOUNTER — Emergency Department (HOSPITAL_COMMUNITY): Payer: BC Managed Care – PPO

## 2014-02-19 ENCOUNTER — Emergency Department (HOSPITAL_COMMUNITY)
Admission: EM | Admit: 2014-02-19 | Discharge: 2014-02-20 | Disposition: A | Discharge status: Home / Self Care | Payer: BC Managed Care – PPO | Attending: Emergency Medicine | Admitting: Emergency Medicine

## 2014-02-19 DIAGNOSIS — F410 Panic disorder [episodic paroxysmal anxiety] without agoraphobia: Secondary | ICD-10-CM | POA: Insufficient documentation

## 2014-02-19 DIAGNOSIS — F17200 Nicotine dependence, unspecified, uncomplicated: Secondary | ICD-10-CM | POA: Insufficient documentation

## 2014-02-19 DIAGNOSIS — Z860100 Personal history of colon polyps, unspecified: Secondary | ICD-10-CM | POA: Insufficient documentation

## 2014-02-19 DIAGNOSIS — Z9851 Tubal ligation status: Secondary | ICD-10-CM | POA: Insufficient documentation

## 2014-02-19 DIAGNOSIS — R110 Nausea: Secondary | ICD-10-CM | POA: Insufficient documentation

## 2014-02-19 DIAGNOSIS — R109 Unspecified abdominal pain: Secondary | ICD-10-CM

## 2014-02-19 DIAGNOSIS — K219 Gastro-esophageal reflux disease without esophagitis: Secondary | ICD-10-CM | POA: Insufficient documentation

## 2014-02-19 DIAGNOSIS — Z7982 Long term (current) use of aspirin: Secondary | ICD-10-CM | POA: Insufficient documentation

## 2014-02-19 DIAGNOSIS — R1031 Right lower quadrant pain: Secondary | ICD-10-CM | POA: Insufficient documentation

## 2014-02-19 DIAGNOSIS — Z8739 Personal history of other diseases of the musculoskeletal system and connective tissue: Secondary | ICD-10-CM | POA: Insufficient documentation

## 2014-02-19 DIAGNOSIS — G8929 Other chronic pain: Secondary | ICD-10-CM | POA: Insufficient documentation

## 2014-02-19 DIAGNOSIS — Z90710 Acquired absence of both cervix and uterus: Secondary | ICD-10-CM | POA: Insufficient documentation

## 2014-02-19 DIAGNOSIS — Z8673 Personal history of transient ischemic attack (TIA), and cerebral infarction without residual deficits: Secondary | ICD-10-CM | POA: Insufficient documentation

## 2014-02-19 DIAGNOSIS — Z8679 Personal history of other diseases of the circulatory system: Secondary | ICD-10-CM | POA: Insufficient documentation

## 2014-02-19 HISTORY — DX: Noninfective gastroenteritis and colitis, unspecified: K52.9

## 2014-02-19 HISTORY — DX: Other chronic pain: G89.29

## 2014-02-19 HISTORY — DX: Unspecified abdominal pain: R10.9

## 2014-02-19 LAB — CBC
HEMATOCRIT: 39.5 % (ref 36.0–46.0)
HEMOGLOBIN: 12.9 g/dL (ref 12.0–15.0)
MCH: 30.4 pg (ref 26.0–34.0)
MCHC: 32.7 g/dL (ref 30.0–36.0)
MCV: 93.2 fL (ref 78.0–100.0)
Platelets: 271 10*3/uL (ref 150–400)
RBC: 4.24 MIL/uL (ref 3.87–5.11)
RDW: 13.4 % (ref 11.5–15.5)
WBC: 6.8 10*3/uL (ref 4.0–10.5)

## 2014-02-19 LAB — URINALYSIS, ROUTINE W REFLEX MICROSCOPIC
BILIRUBIN URINE: NEGATIVE
GLUCOSE, UA: NEGATIVE mg/dL
Hgb urine dipstick: NEGATIVE
KETONES UR: NEGATIVE mg/dL
LEUKOCYTES UA: NEGATIVE
Nitrite: NEGATIVE
PROTEIN: NEGATIVE mg/dL
Specific Gravity, Urine: <1.005 — ABNORMAL LOW (ref 1.005–1.030)
Urobilinogen, UA: 0.2 mg/dL (ref 0.0–1.0)
pH: 5.5 (ref 5.0–8.0)

## 2014-02-19 LAB — COMPREHENSIVE METABOLIC PANEL
ALK PHOS: 64 U/L (ref 39–117)
ALT: 10 U/L (ref 0–35)
AST: 22 U/L (ref 0–37)
Albumin: 4.2 g/dL (ref 3.5–5.2)
BILIRUBIN TOTAL: 0.3 mg/dL (ref 0.3–1.2)
BUN: 6 mg/dL (ref 6–23)
CHLORIDE: 104 meq/L (ref 96–112)
CO2: 30 meq/L (ref 19–32)
Calcium: 9.8 mg/dL (ref 8.4–10.5)
Creatinine, Ser: 0.82 mg/dL (ref 0.50–1.10)
GFR calc non Af Amer: 75 mL/min — ABNORMAL LOW (ref >90)
GFR, EST AFRICAN AMERICAN: 87 mL/min — AB (ref >90)
GLUCOSE: 103 mg/dL — AB (ref 70–99)
POTASSIUM: 4.3 meq/L (ref 3.7–5.3)
Sodium: 144 mEq/L (ref 137–147)
Total Protein: 7.5 g/dL (ref 6.0–8.3)

## 2014-02-19 LAB — LIPASE, BLOOD: Lipase: 30 U/L (ref 11–59)

## 2014-02-19 MED ORDER — SODIUM CHLORIDE 0.9 % IV SOLN
INTRAVENOUS | Status: DC
  Administered 2014-02-19: 1000 mL via INTRAVENOUS

## 2014-02-19 MED ORDER — IOHEXOL 300 MG/ML  SOLN
100.0000 mL | Freq: Once | INTRAMUSCULAR | Status: AC | PRN
  Administered 2014-02-19: 100 mL via INTRAVENOUS

## 2014-02-19 MED ORDER — ONDANSETRON HCL 4 MG/2ML IJ SOLN
4.0000 mg | INTRAMUSCULAR | Status: DC | PRN
  Administered 2014-02-19: 4 mg via INTRAVENOUS
  Filled 2014-02-19: qty 2

## 2014-02-19 MED ORDER — IOHEXOL 300 MG/ML  SOLN
50.0000 mL | Freq: Once | INTRAMUSCULAR | Status: AC | PRN
  Administered 2014-02-19: 50 mL via ORAL

## 2014-02-19 NOTE — ED Notes (Signed)
RLQ pain began at noon today. Pt also states nausea.

## 2014-02-19 NOTE — ED Provider Notes (Signed)
CSN: [id number]     Arrival date & time 02/19/14  1802 History   First MD Initiated Contact with Patient 02/19/14 1958     Chief Complaint  Patient presents with  . Abdominal Pain      HPI Pt was seen at 2005.  Per pt, c/o gradual onset and persistence of waxing and waning RLQ abd "pain" since this morning.  Has been associated with nausea. Describes the abd pain as "sharp," with occasional radiation into her right lower back. Denies vomiting/diarrhea, no fevers, no back pain, no rash, no CP/SOB, no Wiler or blood in stools.       Past Medical History  Diagnosis Date  . Atypical chest pain   . Tobacco abuse   . History of fibromyalgia   . Crohn disease   . Depression   . Anxiety   . Brain aneurysm   . Hyperplastic colon polyp   . GERD (gastroesophageal reflux disease)   . Panic attacks   . Stroke   . Inflammatory bowel disease   . Chronic abdominal pain    Past Surgical History  Procedure Laterality Date  . Orif ulnar fracture      left   . Rotator cuff repair      left  . Vaginal hysterectomy      has her ovaries  . Tubal ligation    . Tonsillectomy    . Cataract extraction      bilateral  . Vein removed      right varicose vein   Family History  Problem Relation Age of Onset  . Diabetes Mother   . Kidney failure Mother   . Diabetes Brother   . Stroke Father   . Diabetes Sister     x 2  . Colon cancer Brother     in his 8's   History  Substance Use Topics  . Smoking status: Current Every Day Smoker    Types: Cigarettes  . Smokeless tobacco: Never Used     Comment: one pack/ 3 days, quit for a year, Tobacco info given 03/07/13  . Alcohol Use: No    Review of Systems ROS: Statement: All systems negative except as marked or noted in the HPI; Constitutional: Negative for fever and chills. ; ; Eyes: Negative for eye pain, redness and discharge. ; ; ENMT: Negative for ear pain, hoarseness, nasal congestion, sinus pressure and sore throat. ; ;  Cardiovascular: Negative for chest pain, palpitations, diaphoresis, dyspnea and peripheral edema. ; ; Respiratory: Negative for cough, wheezing and stridor. ; ; Gastrointestinal: +nausea, abd pain. Negative for vomiting, diarrhea, blood in stool, hematemesis, jaundice and rectal bleeding. . ; ; Genitourinary: Negative for dysuria, flank pain and hematuria. ; ; Musculoskeletal: Negative for back pain and neck pain. Negative for swelling and trauma.; ; Skin: Negative for pruritus, rash, abrasions, blisters, bruising and skin lesion.; ; Neuro: Negative for headache, lightheadedness and neck stiffness. Negative for weakness, altered level of consciousness , altered mental status, extremity weakness, paresthesias, involuntary movement, seizure and syncope.      Allergies  Ciprofloxacin; Meperidine hcl; Opium; Promethazine hcl; and Propoxyphene n-acetaminophen  Home Medications   Current Outpatient Rx  Name  Route  Sig  Dispense  Refill  . aspirin EC 81 MG tablet   Oral   Take 1 tablet (81 mg total) by mouth daily.   30 tablet   2   . carboxymethylcellulose (REFRESH PLUS) 0.5 % SOLN   Ophthalmic   Apply 1 drop to  eye 3 (three) times daily as needed.         Marland Kitchen LORazepam (ATIVAN) 0.5 MG tablet   Oral   Take 0.5 mg by mouth at bedtime as needed for anxiety or sleep (Allowed up to 3 times daily as needed for anxiety). For anxiety         . meclizine (ANTIVERT) 25 MG tablet   Oral   Take 25 mg by mouth every 8 (eight) hours as needed (vertigo). Nausea         . Phenyleph-Doxylamine-DM-APAP (ALKA-SELTZER PLS ALLERGY & CGH PO)   Oral   Take 1 packet by mouth daily as needed (for cold and cough symptoms).          Marland Kitchen albuterol (VENTOLIN HFA) 108 (90 BASE) MCG/ACT inhaler   Inhalation   Inhale 2 puffs into the lungs every 6 (six) hours as needed for wheezing or shortness of breath.           BP 116/56  Pulse 77  Temp(Src) 98.2 F (36.8 C) (Oral)  Resp 18  Ht 5' 3"  (1.6 m)  Wt  118 lb 5 oz (53.666 kg)  BMI 20.96 kg/m2  SpO2 97% Physical Exam 2010: Physical examination:  Nursing notes reviewed; Vital signs and O2 SAT reviewed;  Constitutional: Well developed, Well nourished, Well hydrated, In no acute distress; Head:  Normocephalic, atraumatic; Eyes: EOMI, PERRL, No scleral icterus; ENMT: Mouth and pharynx normal, Mucous membranes moist; Neck: Supple, Full range of motion, No lymphadenopathy; Cardiovascular: Regular rate and rhythm, No gallop; Respiratory: Breath sounds clear & equal bilaterally, No rales, rhonchi, wheezes.  Speaking full sentences with ease, Normal respiratory effort/excursion; Chest: Nontender, Movement normal; Abdomen: Soft, +RLQ tender to palp. +Rovsing's sign. No guarding. Nondistended, Normal bowel sounds; Genitourinary: No CVA tenderness; Extremities: Pulses normal, No tenderness, No edema, No calf edema or asymmetry.; Neuro: AA&Ox3, Major CN grossly intact.  Speech clear. No gross focal motor or sensory deficits in extremities. Climbs on and off stretcher easily by herself. Gait steady.; Skin: Color normal, Warm, Dry.   ED Course  Procedures    EKG Interpretation None      MDM  MDM Reviewed: previous chart, nursing note and vitals Reviewed previous: labs Interpretation: labs    Results for orders placed during the hospital encounter of 02/19/14  CBC      Result Value Ref Range   WBC 6.8  4.0 - 10.5 K/uL   RBC 4.24  3.87 - 5.11 MIL/uL   Hemoglobin 12.9  12.0 - 15.0 g/dL   HCT 39.5  36.0 - 46.0 %   MCV 93.2  78.0 - 100.0 fL   MCH 30.4  26.0 - 34.0 pg   MCHC 32.7  30.0 - 36.0 g/dL   RDW 13.4  11.5 - 15.5 %   Platelets 271  150 - 400 K/uL  COMPREHENSIVE METABOLIC PANEL      Result Value Ref Range   Sodium 144  137 - 147 mEq/L   Potassium 4.3  3.7 - 5.3 mEq/L   Chloride 104  96 - 112 mEq/L   CO2 30  19 - 32 mEq/L   Glucose, Bld 103 (*) 70 - 99 mg/dL   BUN 6  6 - 23 mg/dL   Creatinine, Ser 0.82  0.50 - 1.10 mg/dL   Calcium 9.8   8.4 - 10.5 mg/dL   Total Protein 7.5  6.0 - 8.3 g/dL   Albumin 4.2  3.5 - 5.2 g/dL   AST 22  0 - 37 U/L   ALT 10  0 - 35 U/L   Alkaline Phosphatase 64  39 - 117 U/L   Total Bilirubin 0.3  0.3 - 1.2 mg/dL   GFR calc non Af Amer 75 (*) >90 mL/min   GFR calc Af Amer 87 (*) >90 mL/min  URINALYSIS, ROUTINE W REFLEX MICROSCOPIC      Result Value Ref Range   Color, Urine YELLOW  YELLOW   APPearance CLEAR  CLEAR   Specific Gravity, Urine <1.005 (*) 1.005 - 1.030   pH 5.5  5.0 - 8.0   Glucose, UA NEGATIVE  NEGATIVE mg/dL   Hgb urine dipstick NEGATIVE  NEGATIVE   Bilirubin Urine NEGATIVE  NEGATIVE   Ketones, ur NEGATIVE  NEGATIVE mg/dL   Protein, ur NEGATIVE  NEGATIVE mg/dL   Urobilinogen, UA 0.2  0.0 - 1.0 mg/dL   Nitrite NEGATIVE  NEGATIVE   Leukocytes, UA NEGATIVE  NEGATIVE  LIPASE, BLOOD      Result Value Ref Range   Lipase 30  11 - 59 U/L     2150:  Labs and Udip reassuring. CT A/P pending; dispo based on results. Sign out to Dr. Roderic Palau.   Alfonzo Feller, DO 02/19/14 2151

## 2014-02-20 MED ORDER — TRAMADOL HCL 50 MG PO TABS: 50.0000 mg | ORAL_TABLET | Freq: Four times a day (QID) | ORAL | Status: DC | PRN

## 2014-02-20 NOTE — Discharge Instructions (Signed)
Follow up with your md in 2-3 days for recheck

## 2014-02-20 NOTE — ED Provider Notes (Signed)
Pt had normal ct abd.  Will follow up with pcp in 2 days  Maudry Diego, MD 02/20/14 [phone]

## 2014-03-27 ENCOUNTER — Other Ambulatory Visit: Payer: Self-pay | Admitting: Nurse Practitioner

## 2014-03-27 DIAGNOSIS — Z1231 Encounter for screening mammogram for malignant neoplasm of breast: Secondary | ICD-10-CM

## 2014-03-27 DIAGNOSIS — Z780 Asymptomatic menopausal state: Secondary | ICD-10-CM

## 2014-04-10 ENCOUNTER — Encounter (INDEPENDENT_AMBULATORY_CARE_PROVIDER_SITE_OTHER): Payer: Self-pay

## 2014-04-10 ENCOUNTER — Ambulatory Visit
Admission: RE | Admit: 2014-04-10 | Discharge: 2014-04-10 | Disposition: A | Discharge status: Home / Self Care | Payer: BC Managed Care – PPO | Source: Ambulatory Visit | Attending: Nurse Practitioner | Admitting: Nurse Practitioner

## 2014-04-10 DIAGNOSIS — Z780 Asymptomatic menopausal state: Secondary | ICD-10-CM

## 2014-04-10 DIAGNOSIS — Z1231 Encounter for screening mammogram for malignant neoplasm of breast: Secondary | ICD-10-CM

## 2014-06-28 ENCOUNTER — Encounter: Payer: Self-pay | Admitting: Cardiovascular Disease

## 2014-07-10 DIAGNOSIS — G2581 Restless legs syndrome: Secondary | ICD-10-CM | POA: Insufficient documentation

## 2014-11-02 ENCOUNTER — Encounter (HOSPITAL_COMMUNITY): Payer: Self-pay | Admitting: *Deleted

## 2014-11-02 ENCOUNTER — Observation Stay (HOSPITAL_COMMUNITY)
Admission: EM | Admit: 2014-11-02 | Discharge: 2014-11-03 | Disposition: A | Discharge status: Home / Self Care | Payer: BC Managed Care – PPO | Attending: Family Medicine | Admitting: Family Medicine

## 2014-11-02 ENCOUNTER — Emergency Department (HOSPITAL_COMMUNITY): Payer: BC Managed Care – PPO

## 2014-11-02 ENCOUNTER — Observation Stay (HOSPITAL_COMMUNITY): Payer: BC Managed Care – PPO

## 2014-11-02 DIAGNOSIS — Z7982 Long term (current) use of aspirin: Secondary | ICD-10-CM | POA: Insufficient documentation

## 2014-11-02 DIAGNOSIS — K5090 Crohn's disease, unspecified, without complications: Secondary | ICD-10-CM | POA: Diagnosis present

## 2014-11-02 DIAGNOSIS — F329 Major depressive disorder, single episode, unspecified: Secondary | ICD-10-CM | POA: Insufficient documentation

## 2014-11-02 DIAGNOSIS — R200 Anesthesia of skin: Secondary | ICD-10-CM | POA: Diagnosis not present

## 2014-11-02 DIAGNOSIS — Z79899 Other long term (current) drug therapy: Secondary | ICD-10-CM | POA: Insufficient documentation

## 2014-11-02 DIAGNOSIS — K219 Gastro-esophageal reflux disease without esophagitis: Secondary | ICD-10-CM | POA: Diagnosis not present

## 2014-11-02 DIAGNOSIS — Z720 Tobacco use: Secondary | ICD-10-CM | POA: Diagnosis not present

## 2014-11-02 DIAGNOSIS — R531 Weakness: Secondary | ICD-10-CM | POA: Diagnosis present

## 2014-11-02 DIAGNOSIS — R001 Bradycardia, unspecified: Secondary | ICD-10-CM | POA: Diagnosis present

## 2014-11-02 DIAGNOSIS — K6389 Other specified diseases of intestine: Secondary | ICD-10-CM | POA: Insufficient documentation

## 2014-11-02 DIAGNOSIS — R202 Paresthesia of skin: Secondary | ICD-10-CM

## 2014-11-02 DIAGNOSIS — Z86010 Personal history of colonic polyps: Secondary | ICD-10-CM | POA: Diagnosis not present

## 2014-11-02 DIAGNOSIS — G8929 Other chronic pain: Secondary | ICD-10-CM | POA: Diagnosis not present

## 2014-11-02 DIAGNOSIS — F410 Panic disorder [episodic paroxysmal anxiety] without agoraphobia: Secondary | ICD-10-CM | POA: Diagnosis not present

## 2014-11-02 DIAGNOSIS — F419 Anxiety disorder, unspecified: Secondary | ICD-10-CM

## 2014-11-02 DIAGNOSIS — M6281 Muscle weakness (generalized): Secondary | ICD-10-CM | POA: Diagnosis not present

## 2014-11-02 DIAGNOSIS — I671 Cerebral aneurysm, nonruptured: Secondary | ICD-10-CM | POA: Diagnosis not present

## 2014-11-02 DIAGNOSIS — M797 Fibromyalgia: Secondary | ICD-10-CM | POA: Diagnosis present

## 2014-11-02 DIAGNOSIS — H5310 Unspecified subjective visual disturbances: Secondary | ICD-10-CM | POA: Diagnosis present

## 2014-11-02 DIAGNOSIS — G459 Transient cerebral ischemic attack, unspecified: Secondary | ICD-10-CM

## 2014-11-02 DIAGNOSIS — H539 Unspecified visual disturbance: Secondary | ICD-10-CM | POA: Insufficient documentation

## 2014-11-02 DIAGNOSIS — F411 Generalized anxiety disorder: Secondary | ICD-10-CM | POA: Diagnosis present

## 2014-11-02 DIAGNOSIS — Z8673 Personal history of transient ischemic attack (TIA), and cerebral infarction without residual deficits: Secondary | ICD-10-CM | POA: Diagnosis not present

## 2014-11-02 DIAGNOSIS — M6289 Other specified disorders of muscle: Secondary | ICD-10-CM

## 2014-11-02 LAB — COMPREHENSIVE METABOLIC PANEL
ALK PHOS: 71 U/L (ref 39–117)
ALT: 9 U/L (ref 0–35)
ANION GAP: 12 (ref 5–15)
AST: 17 U/L (ref 0–37)
Albumin: 3.8 g/dL (ref 3.5–5.2)
BILIRUBIN TOTAL: 0.3 mg/dL (ref 0.3–1.2)
BUN: 9 mg/dL (ref 6–23)
CHLORIDE: 101 meq/L (ref 96–112)
CO2: 26 meq/L (ref 19–32)
CREATININE: 0.74 mg/dL (ref 0.50–1.10)
Calcium: 9.2 mg/dL (ref 8.4–10.5)
GFR, EST NON AFRICAN AMERICAN: 89 mL/min — AB (ref >90)
GLUCOSE: 100 mg/dL — AB (ref 70–99)
POTASSIUM: 4.1 meq/L (ref 3.7–5.3)
Sodium: 139 mEq/L (ref 137–147)
Total Protein: 6.9 g/dL (ref 6.0–8.3)

## 2014-11-02 LAB — CBC
HEMATOCRIT: 40.7 % (ref 36.0–46.0)
HEMOGLOBIN: 13 g/dL (ref 12.0–15.0)
MCH: 29.9 pg (ref 26.0–34.0)
MCHC: 31.9 g/dL (ref 30.0–36.0)
MCV: 93.6 fL (ref 78.0–100.0)
Platelets: 299 10*3/uL (ref 150–400)
RBC: 4.35 MIL/uL (ref 3.87–5.11)
RDW: 14.3 % (ref 11.5–15.5)
WBC: 9.4 10*3/uL (ref 4.0–10.5)

## 2014-11-02 LAB — DIFFERENTIAL
Basophils Absolute: 0.1 10*3/uL (ref 0.0–0.1)
Basophils Relative: 1 % (ref 0–1)
Eosinophils Absolute: 0.2 10*3/uL (ref 0.0–0.7)
Eosinophils Relative: 2 % (ref 0–5)
LYMPHS ABS: 1.4 10*3/uL (ref 0.7–4.0)
Lymphocytes Relative: 15 % (ref 12–46)
MONOS PCT: 7 % (ref 3–12)
Monocytes Absolute: 0.6 10*3/uL (ref 0.1–1.0)
NEUTROS ABS: 7.1 10*3/uL (ref 1.7–7.7)
NEUTROS PCT: 75 % (ref 43–77)

## 2014-11-02 LAB — HEMOGLOBIN A1C
HEMOGLOBIN A1C: 6.1 % — AB (ref <5.7)
MEAN PLASMA GLUCOSE: 128 mg/dL — AB (ref <117)

## 2014-11-02 LAB — I-STAT TROPONIN, ED: TROPONIN I, POC: 0 ng/mL (ref 0.00–0.08)

## 2014-11-02 LAB — PROTIME-INR
INR: 1 (ref 0.00–1.49)
Prothrombin Time: 13.3 seconds (ref 11.6–15.2)

## 2014-11-02 LAB — APTT: APTT: 31 s (ref 24–37)

## 2014-11-02 LAB — TROPONIN I: Troponin I: <0.3 ng/mL (ref <0.30)

## 2014-11-02 LAB — TSH: TSH: 1.18 u[IU]/mL (ref 0.350–4.500)

## 2014-11-02 MED ORDER — ASPIRIN 325 MG PO TABS
325.0000 mg | ORAL_TABLET | Freq: Every day | ORAL | Status: DC
  Administered 2014-11-03: 325 mg via ORAL
  Filled 2014-11-02: qty 1

## 2014-11-02 MED ORDER — SODIUM CHLORIDE 0.9 % IJ SOLN
INTRAMUSCULAR | Status: AC
  Filled 2014-11-02: qty 500

## 2014-11-02 MED ORDER — ALBUTEROL SULFATE (2.5 MG/3ML) 0.083% IN NEBU: 2.5000 mg | INHALATION_SOLUTION | Freq: Four times a day (QID) | RESPIRATORY_TRACT | Status: DC | PRN

## 2014-11-02 MED ORDER — ACETAMINOPHEN 325 MG PO TABS: 650.0000 mg | ORAL_TABLET | Freq: Four times a day (QID) | ORAL | Status: DC | PRN

## 2014-11-02 MED ORDER — POLYVINYL ALCOHOL 1.4 % OP SOLN
1.0000 [drp] | Freq: Three times a day (TID) | OPHTHALMIC | Status: DC | PRN
  Administered 2014-11-02: 1 [drp] via OPHTHALMIC
  Filled 2014-11-02 (×2): qty 15

## 2014-11-02 MED ORDER — MECLIZINE HCL 12.5 MG PO TABS: 25.0000 mg | ORAL_TABLET | Freq: Three times a day (TID) | ORAL | Status: DC | PRN

## 2014-11-02 MED ORDER — GUAIFENESIN-DM 100-10 MG/5ML PO SYRP: 5.0000 mL | ORAL_SOLUTION | ORAL | Status: DC | PRN

## 2014-11-02 MED ORDER — SODIUM CHLORIDE 0.9 % IJ SOLN
3.0000 mL | Freq: Two times a day (BID) | INTRAMUSCULAR | Status: DC
Start: 2014-11-02 — End: 2014-11-03
  Administered 2014-11-02 – 2014-11-03 (×2): 3 mL via INTRAVENOUS

## 2014-11-02 MED ORDER — HYDROCODONE-ACETAMINOPHEN 5-325 MG PO TABS: 1.0000 | ORAL_TABLET | ORAL | Status: DC | PRN

## 2014-11-02 MED ORDER — LORAZEPAM 0.5 MG PO TABS
0.5000 mg | ORAL_TABLET | Freq: Every evening | ORAL | Status: DC | PRN
  Administered 2014-11-02: 0.5 mg via ORAL
  Filled 2014-11-02: qty 1

## 2014-11-02 MED ORDER — ACETAMINOPHEN 650 MG RE SUPP: 650.0000 mg | Freq: Four times a day (QID) | RECTAL | Status: DC | PRN

## 2014-11-02 MED ORDER — HEPARIN SODIUM (PORCINE) 5000 UNIT/ML IJ SOLN
5000.0000 [IU] | Freq: Three times a day (TID) | INTRAMUSCULAR | Status: DC
  Administered 2014-11-02 – 2014-11-03 (×3): 5000 [IU] via SUBCUTANEOUS
  Filled 2014-11-02 (×4): qty 1

## 2014-11-02 MED ORDER — ASPIRIN 325 MG PO TABS: 325.0000 mg | ORAL_TABLET | Freq: Every day | ORAL | Status: DC

## 2014-11-02 MED ORDER — ASPIRIN 81 MG PO CHEW
324.0000 mg | CHEWABLE_TABLET | Freq: Once | ORAL | Status: AC
  Administered 2014-11-02: 324 mg via ORAL
  Filled 2014-11-02: qty 4

## 2014-11-02 MED ORDER — IOHEXOL 350 MG/ML SOLN
100.0000 mL | Freq: Once | INTRAVENOUS | Status: AC | PRN
  Administered 2014-11-02: 100 mL via INTRAVENOUS

## 2014-11-02 MED ORDER — ALBUTEROL SULFATE HFA 108 (90 BASE) MCG/ACT IN AERS
2.0000 | INHALATION_SPRAY | Freq: Four times a day (QID) | RESPIRATORY_TRACT | Status: DC | PRN
  Filled 2014-11-02: qty 6.7

## 2014-11-02 MED ORDER — HEPARIN SODIUM (PORCINE) 5000 UNIT/ML IJ SOLN: 5000.0000 [IU] | Freq: Three times a day (TID) | INTRAMUSCULAR | Status: DC

## 2014-11-02 NOTE — ED Notes (Signed)
Call [facility] to set up consult time.

## 2014-11-02 NOTE — H&P (Signed)
[facility] History and Physical  [place] MOSKOWITZ CHY:[id number] DOB: 01/15/1951 DOA: 11/02/2014  Referring physician: ED physician, Dr. Reather Converse. PCP: Vicenta Aly, FNP   Chief Complaint: Left-sided numbness and weakness.  HPI: Stacy Hansen is a 63 y.o. female with a history of chronic anxiety, Crohn's disease, and tiny brain aneurysm, who presents to the emergency department today with a complaint of left-sided heaviness/weakness, left facial numbness, and visual changes. The patient had a similar presentation in August 2014. 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 Since that time, she has had mild residual left-sided weakness, although the MRI/MRA did not reveal a stroke. This morning, she was in her usual state of health. While she was speaking to her son on the phone at approximately 9:15 AM this morning, she developed left sided facial numbness, left arm heaviness and weakness, and then left leg heaviness/weakness. She also had some visual changes with blurred vision and a "flickering" sensation in her left eye. She had transient left-sided headache. She had mild dizziness which she describes as an off balance feeling. She denies total blindness, difficulty swallowing, difficulty chewing, and difficulty speaking. She denies passing out or falling. She denies shortness of breath, chest pain or chest palpitations. She denies any incontinence of bladder or bowels. She denies any weakness or numbness on the right side of her body. She does have occasional neck pain from reaching off of shelves and mild lifting at [employer] where she works. She denies any trauma.  In the emergency department, she is afebrile and hemodynamically stable; mildly bradycardic. CT angiogram of her head and neck were unrevealing. Lab data are unremarkable. EKG reveals normal sinus rhythm with a heart rate of 66 bpm and no concerning findings. She is being  admitted for further evaluation and management.    Review of Systems:  She has mild residual left-sided weakness, but denies need for an assistive device. She has occasional neck discomfort. She had a rash last week which resolved. She has some loose stools chronically. Otherwise, review of systems is negative.  Past Medical History  Diagnosis Date  . Atypical chest pain   . Tobacco abuse   . History of fibromyalgia   . Crohn disease   . Depression   . Anxiety   . Brain aneurysm   . Hyperplastic colon polyp   . GERD (gastroesophageal reflux disease)   . Panic attacks   . Stroke   . Inflammatory bowel disease   . Chronic abdominal pain    Past Surgical History  Procedure Laterality Date  . Orif ulnar fracture      left   . Rotator cuff repair      left  . Vaginal hysterectomy      has her ovaries  . Tubal ligation    . Tonsillectomy    . Cataract extraction      bilateral  . Vein removed      right varicose vein   Social History: She is married. She has 2 children. She works at [employer]. She smokes 5 cigarettes daily, down from a pack per day. She denies alcohol and illicit drug use.   Allergies  Allergen Reactions  . Ciprofloxacin Other (See Comments)    Unknown reaction per pt  . Meperidine Hcl Nausea And Vomiting and Other (See Comments)    Causes blood pressure to drop when taken with Phenergan through IV  . Opium Other (See Comments)  Causes blood pressure to drop when taken with Phenergan through IV  . Promethazine Hcl Nausea And Vomiting and Other (See Comments)    Causes blood pressure to drop when taken with Demeral via IV  . Propoxyphene N-Acetaminophen Other (See Comments)    "pass out"    Family History  Problem Relation Age of Onset  . Diabetes Mother   . Kidney failure Mother   . Diabetes Brother   . Stroke Father   . Diabetes Sister     x 2  . Colon cancer Brother     in his 42's     Prior to Admission medications   Medication Sig  Start Date End Date Taking? Authorizing Provider  albuterol (PROVENTIL HFA;VENTOLIN HFA) 108 (90 BASE) MCG/ACT inhaler Inhale 2 puffs into the lungs every 6 (six) hours as needed for wheezing or shortness of breath.   Yes Historical Provider, MD  aspirin EC 81 MG tablet Take 1 tablet (81 mg total) by mouth daily. 07/12/13  Yes Costin Karlyne Greenspan, MD  LORazepam (ATIVAN) 0.5 MG tablet Take 0.5 mg by mouth at bedtime as needed for anxiety or sleep (Allowed up to 3 times daily as needed for anxiety). For anxiety 05/06/12  Yes Reyne Dumas, MD  carboxymethylcellulose (REFRESH PLUS) 0.5 % SOLN Apply 1 drop to eye 3 (three) times daily as needed (dry/itching eyes).     Historical Provider, MD  meclizine (ANTIVERT) 25 MG tablet Take 25 mg by mouth every 8 (eight) hours as needed (vertigo). Nausea 10/17/13   Historical Provider, MD  Phenyleph-Doxylamine-DM-APAP (ALKA-SELTZER PLS ALLERGY & CGH PO) Take 1 packet by mouth daily as needed (for cold and cough symptoms).     Historical Provider, MD  traMADol (ULTRAM) 50 MG tablet Take 1 tablet (50 mg total) by mouth every 6 (six) hours as needed. Patient not taking: Reported on 11/02/2014 02/20/14   Maudry Diego, MD   Physical Exam: Filed Vitals:   11/02/14 1107 11/02/14 1230 11/02/14 1330 11/02/14 1400  BP:  121/91 109/62 99/50  Pulse:   67 55  Temp:      TempSrc:      Resp:   16 10  Height: 5' 3"  (1.6 m)     Weight: 51.256 kg (113 lb)     SpO2:   95% 95%    Wt Readings from Last 3 Encounters:  11/02/14 51.256 kg (113 lb)  02/19/14 53.666 kg (118 lb 5 oz)  11/21/13 54.885 kg (121 lb)    General:  Appears calm and comfortable; 62 year old woman in no acute distress. Eyes: PERRL, normal lids, irises & conjunctiva; conjunctivae are clear and sclerae are white. ENT: grossly normal hearing, lips & tongue; oropharynx because membranes are moist. Neck: no LAD, masses or thyromegaly; questionable mild bruit on the right versus radiating  murmur. Cardiovascular: S1, S2, with a soft systolic murmur. No LE edema; pedal pulses palpable. Telemetry: SR, no arrhythmias  Respiratory: CTA bilaterally, no w/r/r. Normal respiratory effort. Abdomen: soft, ntnd, positive bowel sounds, no masses palpated; no distention. Skin: no rash or induration seen on limited exam Musculoskeletal: grossly normal tone BUE/BLE; no acute hot red joints. Psychiatric: grossly normal mood and affect, speech fluent and appropriate; pleasant affect. Neurologic: She is alert and oriented 3. Cranial nerves II through XII are intact with exception of decreased sensation over the left side of her face. Strength is 5 minus over 5 on the left upper extremity and 4+ over 5 strength on the left lower extremity  in the sitting position; strength on the right upper and right lower extremity 5 over 5. Sensation grossly intact with the exception of decreased sensation to cold touch on the left arm and left leg.           Labs on Admission:  Basic Metabolic Panel:  Recent Labs Lab 11/02/14 1049  NA 139  K 4.1  CL 101  CO2 26  GLUCOSE 100*  BUN 9  CREATININE 0.74  CALCIUM 9.2   Liver Function Tests:  Recent Labs Lab 11/02/14 1049  AST 17  ALT 9  ALKPHOS 71  BILITOT 0.3  PROT 6.9  ALBUMIN 3.8   No results for input(s): LIPASE, AMYLASE in the last 168 hours. No results for input(s): AMMONIA in the last 168 hours. CBC:  Recent Labs Lab 11/02/14 1049  WBC 9.4  NEUTROABS 7.1  HGB 13.0  HCT 40.7  MCV 93.6  PLT 299   Cardiac Enzymes: No results for input(s): CKTOTAL, CKMB, CKMBINDEX, TROPONINI in the last 168 hours.  BNP (last 3 results) No results for input(s): PROBNP in the last 8760 hours. CBG: No results for input(s): GLUCAP in the last 168 hours.  Radiological Exams on Admission: Ct Angio Head W/cm &/or Wo Cm  11/02/2014   CLINICAL DATA:  63 year old female with left facial numbness, code stroke. Left side numbness. Initial encounter.   EXAM: CT ANGIOGRAPHY HEAD AND NECK  TECHNIQUE: Multidetector CT imaging of the head and neck was performed using the standard protocol during bolus administration of intravenous contrast. Multiplanar CT image reconstructions and MIPs were obtained to evaluate the vascular anatomy. Carotid stenosis measurements (when applicable) are obtained utilizing NASCET criteria, using the distal internal carotid diameter as the denominator.  CONTRAST:  146m OMNIPAQUE IOHEXOL 350 MG/ML SOLN  COMPARISON:  Head CT without contrast 1108 hr today. Intracranial MRA 07/10/2013.  FINDINGS: CTA HEAD FINDINGS  Stable gray-white matter differentiation from earlier today. No cytotoxic edema identified. No intracranial mass effect or hemorrhage identified. No abnormal enhancement identified.  VASCULAR FINDINGS:  Major intracranial venous structures are enhancing.  Diminutive proximal vertebrobasilar system. The non dominant distal right vertebral artery terminates in the right PICA. The left PICA is patent. Thread-like proximal basilar artery. Persistent right trigeminal artery supplying the distal basilar. SCA and left PCA origins are within normal limits. There is also a fetal type right PCA origin. The left posterior communicating artery also is present. Bilateral PCA branches are within normal limits.  No ICA siphon atherosclerosis or stenosis. Persistent right trigeminal artery. Ophthalmic and posterior communicating artery origins are within normal limits. There is an infundibulum at the left PComm origin.  Normal carotid termini. Normal MCA and ACA origins. Diminutive anterior communicating artery. Bilateral ACA branches are within normal limits. Left MCA branches are within normal limits.  Right MCA M1 segment is within normal limits. The right MCA bifurcation is patent. The right MCA anterior division branches appear diminutive compared to those on the left (series 602, image 49). No major branch occlusion is identified.  Review  of the MIP images confirms the above findings.  CTA NECK FINDINGS  Paraseptal emphysema and apical scarring in the lung apices. No superior mediastinal lymphadenopathy. Negative thyroid, larynx, pharynx, parapharyngeal spaces, retropharyngeal space, sublingual space, submandibular glands, and parotid glands. Postoperative changes to the globes. Negative paranasal sinuses and mastoids. Dentition absent. No acute osseous abnormality identified. No cervical lymphadenopathy.  VASCULAR FINDINGS:  Normal anatomic variation of the aortic arch anatomy. There is a shared common  carotid artery origin, and aberrant origin of the right subclavian artery. No arch atherosclerosis or great vessel origin stenosis.  Both common carotid origins are normally patent.  Intermittent motion artifact in the neck. Normal right CCA and right carotid bifurcation. Normal cervical right ICA.  Aberrant origin of the right subclavian artery passing posterior to the trachea and esophagus at the thoracic inlet no proximal right subclavian artery stenosis. Normal right vertebral artery origin. The right vertebral artery is non dominant and diminutive throughout the neck, but patent to the skullbase.  No proximal left subclavian artery stenosis. Normal left vertebral artery origin. Mildly dominant left vertebral artery is normal to the skullbase.  Review of the MIP images confirms the above findings.  IMPRESSION: 1. No major circle of Willis branch occlusion or proximal stenosis, but the right MCA anterior division vessels do appear diminutive compared to those on the left. No changes of anterior left MCA ischemia are evident on CT at this time. 2. Multiple anatomic variations including a persistent right trigeminal artery, a shared common carotid artery origin off of the aortic arch, and an aberrant origin of the right subclavian artery. 3. Otherwise negative CTA with no atherosclerosis or stenosis in the neck.   Electronically Signed   By: Lars Pinks M.D.   On: 11/02/2014 13:27   Ct Head Wo Contrast  11/02/2014   CLINICAL DATA:  Code stroke.  Left-sided numbness.  EXAM: CT HEAD WITHOUT CONTRAST  TECHNIQUE: Contiguous axial images were obtained from the base of the skull through the vertex without intravenous contrast.  COMPARISON:  CT brain 07/10/2013  FINDINGS: Ventricles and sulci are appropriate for patient's age. No evidence for acute cortically based infarct, intracranial hemorrhage, mass lesion or mass effect. 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 Paranasal sinuses are unremarkable. Mastoid air cells are well aerated. Calvarium is intact. Orbits are unremarkable.  IMPRESSION: No acute intracranial process.  Critical Value/emergent results were called by telephone at the time of interpretation on 11/02/2014 at 11:12 am to Dr. Elnora Morrison , who verbally acknowledged these results.   Electronically Signed   By: Lovey Newcomer M.D.   On: 11/02/2014 11:15   Ct Angio Neck W/cm &/or Wo/cm  11/02/2014   CLINICAL DATA:  63 year old female with left facial numbness, code stroke. Left side numbness. Initial encounter.  EXAM: CT ANGIOGRAPHY HEAD AND NECK  TECHNIQUE: Multidetector CT imaging of the head and neck was performed using the standard protocol during bolus administration of intravenous contrast. Multiplanar CT image reconstructions and MIPs were obtained to evaluate the vascular anatomy. Carotid stenosis measurements (when applicable) are obtained utilizing NASCET criteria, using the distal internal carotid diameter as the denominator.  CONTRAST:  153m OMNIPAQUE IOHEXOL 350 MG/ML SOLN  COMPARISON:  Head CT without contrast 1108 hr today. Intracranial MRA 07/10/2013.  FINDINGS: CTA HEAD FINDINGS  Stable gray-white matter differentiation from earlier today. No cytotoxic edema identified. No intracranial mass effect or hemorrhage identified. No abnormal enhancement identified.  VASCULAR FINDINGS:  Major intracranial venous structures are  enhancing.  Diminutive proximal vertebrobasilar system. The non dominant distal right vertebral artery terminates in the right PICA. The left PICA is patent. Thread-like proximal basilar artery. Persistent right trigeminal artery supplying the distal basilar. SCA and left PCA origins are within normal limits. There is also a fetal type right PCA origin. The left posterior communicating artery also is present. Bilateral PCA branches are within normal limits.  No ICA siphon atherosclerosis or stenosis. Persistent right trigeminal artery. Ophthalmic  and posterior communicating artery origins are within normal limits. There is an infundibulum at the left PComm origin.  Normal carotid termini. Normal MCA and ACA origins. Diminutive anterior communicating artery. Bilateral ACA branches are within normal limits. Left MCA branches are within normal limits.  Right MCA M1 segment is within normal limits. The right MCA bifurcation is patent. The right MCA anterior division branches appear diminutive compared to those on the left (series 602, image 49). No major branch occlusion is identified.  Review of the MIP images confirms the above findings.  CTA NECK FINDINGS  Paraseptal emphysema and apical scarring in the lung apices. No superior mediastinal lymphadenopathy. Negative thyroid, larynx, pharynx, parapharyngeal spaces, retropharyngeal space, sublingual space, submandibular glands, and parotid glands. Postoperative changes to the globes. Negative paranasal sinuses and mastoids. Dentition absent. No acute osseous abnormality identified. No cervical lymphadenopathy.  VASCULAR FINDINGS:  Normal anatomic variation of the aortic arch anatomy. There is a shared common carotid artery origin, and aberrant origin of the right subclavian artery. No arch atherosclerosis or great vessel origin stenosis.  Both common carotid origins are normally patent.  Intermittent motion artifact in the neck. Normal right CCA and right carotid  bifurcation. Normal cervical right ICA.  Aberrant origin of the right subclavian artery passing posterior to the trachea and esophagus at the thoracic inlet no proximal right subclavian artery stenosis. Normal right vertebral artery origin. The right vertebral artery is non dominant and diminutive throughout the neck, but patent to the skullbase.  No proximal left subclavian artery stenosis. Normal left vertebral artery origin. Mildly dominant left vertebral artery is normal to the skullbase.  Review of the MIP images confirms the above findings.  IMPRESSION: 1. No major circle of Willis branch occlusion or proximal stenosis, but the right MCA anterior division vessels do appear diminutive compared to those on the left. No changes of anterior left MCA ischemia are evident on CT at this time. 2. Multiple anatomic variations including a persistent right trigeminal artery, a shared common carotid artery origin off of the aortic arch, and an aberrant origin of the right subclavian artery. 3. Otherwise negative CTA with no atherosclerosis or stenosis in the neck.   Electronically Signed   By: Lars Pinks M.D.   On: 11/02/2014 13:27    EKG: Independently reviewed.   Assessment/Plan Principal Problem:   Left-sided weakness Active Problems:   Left sided numbness   Brain aneurysm   Subjective visual disturbance   Tobacco use   Bradycardia   Crohn disease   Fibromyalgia   Chronic anxiety   1. Recurrent left-sided weakness with left-sided numbness; associated visual changes and dizziness. Her symptoms appear to be secondary to an acute CVA, but CT angiogram of her head and neck are unremarkable. Her presentation is very similar to her hospitalization in August 2014 where the workup was essentially negative for an acute ischemic event. She has a small 2.4 mm posterior brain aneurysm per MRI from August 2014; I doubt this is playing a role, but she will need to have a MRI/MRA of the brain for further evaluation  to see if there were any interval differences. We'll also order 2-D echocardiogram and carotid ultrasound for completeness. Will order a TSH and vitamin B12 level to rule out deficiencies. Will order a CT scan of her cervical spine to assess for gross DJD or radiculopathy. She had been taking 81 mg of aspirin daily. The dose will be increased to 325 mg. Will order PT/OT evaluation. 2. 2  x 4 mm brain aneurysm. Order an MRA of the brain for further evaluation. 3. Tobacco use. The patient was congratulated on cutting down. Will order tobacco cessation counseling. 4. Mild transient bradycardia. Will order TSH for evaluation. 2-D echocardiogram ordered as well. 5. Crohn's disease. Currently stable without medications. 6. Chronic anxiety. Will continue Ativan as needed.    Code Status: Full code DVT Prophylaxis: Subcutaneous heparin Family Communication: Discussed with patient, family not available. Disposition Plan: Anticipate discharge to home in the next 24-48 hours.  Time spent: One hour.  [facility] Pager [phone]

## 2014-11-02 NOTE — ED Notes (Signed)
MD at bedside. 

## 2014-11-02 NOTE — Plan of Care (Signed)
Problem: Consults Goal: General Medical Patient Education See Patient Education Module for specific education. Outcome: Progressing HANDOUTS GIVEN SMOKING CESSATION, YOU CAN QUIT, STROKE PREVENTION.

## 2014-11-02 NOTE — Progress Notes (Signed)
The patient is requesting to leave the [place] to go outside to smoke. I discouraged this. I informed her that if she was able to ambulate down the hallway, through the hospital corridors, and outside of [facility], then she will be discharged home as there would be no medical reason to keep her in the hospital and because she was not agreeable to medical management. Also, I informed her that smoking was a risk factor for strokes. She stated that she had cut down to 5 cigarettes daily and it was hard for her to stop smoking completely. I offered a nicotine patch but she declined.

## 2014-11-02 NOTE — Progress Notes (Signed)
Patient upset she states she needs to go outside to smoke and she only smokes 5 a day. Informed patient it is against hospital policy to leave the unit to smoke. Ask patient's spouse if would please patient's cigarettes home. Patient states she will be taking a walk with her spouse.and she will decide if she is going to stay. Refused heparin injection at this time patient states I am not taking any injection in my stomach. Educated the patient on the use of heparin and why medication is given. Nursing supervisor informed of patient wanting to leave the unit to smoke.

## 2014-11-02 NOTE — ED Notes (Signed)
Neurological consult request form faxed to [facility]

## 2014-11-02 NOTE — ED Provider Notes (Signed)
CSN: [id number]     Arrival date & time 11/02/14  1033 History     Chief Complaint  Patient presents with  . Numbness   HPI Comments: 63 year old female with history of anxiety, tobacco abuse, stroke, fibromyalgia presents with left-sided heaviness, left-sided weakness, left facial numbness and decreased vision left eye since 9:15 this morning. Patient doesn't have any significant deficits since her last stroke, nonspecific left arm since last stroke. Patient says he symptoms are new and she woke up feeling normal, no symptoms last night. No significant headache, patient on baby aspirin. Symptoms constant, mild improvement since it started.  The history is provided by the patient. No language interpreter was used.     HPI Comments: Stacy Hansen is a 63 y.o. female who presents to the Emergency Department complaining      Past Medical History  Diagnosis Date  . Atypical chest pain   . Tobacco abuse   . History of fibromyalgia   . Crohn disease   . Depression   . Anxiety   . Brain aneurysm   . Hyperplastic colon polyp   . GERD (gastroesophageal reflux disease)   . Panic attacks   . Stroke   . Inflammatory bowel disease   . Chronic abdominal pain    Past Surgical History  Procedure Laterality Date  . Orif ulnar fracture      left   . Rotator cuff repair      left  . Vaginal hysterectomy      has her ovaries  . Tubal ligation    . Tonsillectomy    . Cataract extraction      bilateral  . Vein removed      right varicose vein   Family History  Problem Relation Age of Onset  . Diabetes Mother   . Kidney failure Mother   . Diabetes Brother   . Stroke Father   . Diabetes Sister     x 2  . Colon cancer Brother     in his 26's   History  Substance Use Topics  . Smoking status: Current Every Day Smoker    Types: Cigarettes  . Smokeless tobacco: Never Used     Comment: one pack/ 3 days, quit for a year, Tobacco info given 03/07/13  . Alcohol Use: No   OB  History    No data available     Review of Systems  Constitutional: Negative for fever and chills.  HENT: Negative for congestion.   Eyes: Positive for visual disturbance.  Respiratory: Negative for shortness of breath.   Cardiovascular: Negative for chest pain.  Gastrointestinal: Negative for vomiting and abdominal pain.  Genitourinary: Negative for dysuria and flank pain.  Musculoskeletal: Negative for back pain, neck pain and neck stiffness.  Skin: Negative for rash.  Neurological: Positive for weakness and numbness. Negative for syncope, speech difficulty, light-headedness and headaches.  All other systems reviewed and are negative.     Allergies  Ciprofloxacin; Meperidine hcl; Opium; Promethazine hcl; and Propoxyphene n-acetaminophen  Home Medications   Prior to Admission medications   Medication Sig Start Date End Date Taking? Authorizing Provider  albuterol (PROVENTIL HFA;VENTOLIN HFA) 108 (90 BASE) MCG/ACT inhaler Inhale 2 puffs into the lungs every 6 (six) hours as needed for wheezing or shortness of breath.   Yes Historical Provider, MD  aspirin EC 81 MG tablet Take 1 tablet (81 mg total) by mouth daily. 07/12/13  Yes Costin Karlyne Greenspan, MD  LORazepam (ATIVAN) 0.5 MG tablet  Take 0.5 mg by mouth at bedtime as needed for anxiety or sleep (Allowed up to 3 times daily as needed for anxiety). For anxiety 05/06/12  Yes Reyne Dumas, MD  carboxymethylcellulose (REFRESH PLUS) 0.5 % SOLN Apply 1 drop to eye 3 (three) times daily as needed (dry/itching eyes).     Historical Provider, MD  meclizine (ANTIVERT) 25 MG tablet Take 25 mg by mouth every 8 (eight) hours as needed (vertigo). Nausea 10/17/13   Historical Provider, MD  Phenyleph-Doxylamine-DM-APAP (ALKA-SELTZER PLS ALLERGY & [facility] PO) Take 1 packet by mouth daily as needed (for cold and cough symptoms).     Historical Provider, MD  traMADol (ULTRAM) 50 MG tablet Take 1 tablet (50 mg total) by mouth every 6 (six) hours as  needed. Patient not taking: Reported on 11/02/2014 02/20/14   Maudry Diego, MD   BP 123/62 mmHg  Pulse 75  Temp(Src) 98.1 F (36.7 C) (Oral)  Resp 15  Ht 5' 3"  (1.6 m)  Wt 113 lb (51.256 kg)  BMI 20.02 kg/m2  SpO2 100% Physical Exam  Constitutional: She is oriented to person, place, and time. She appears well-developed and well-nourished. No distress.  HENT:  Head: Normocephalic and atraumatic.  Eyes: Conjunctivae and EOM are normal. Right eye exhibits no discharge. Left eye exhibits no discharge.  Neck: Normal range of motion. Neck supple. No tracheal deviation present.  Cardiovascular: Normal rate and regular rhythm.   Pulmonary/Chest: Effort normal and breath sounds normal. No respiratory distress.  Abdominal: Soft. She exhibits no distension. There is no tenderness. There is no guarding.  Musculoskeletal: Normal range of motion. She exhibits no edema.  Neurological: She is alert and oriented to person, place, and time.  Patient has decreased vision left eye most specifically upper lateral, pupils equal bilateral, extra the muscle function intact, sensation intact bilateral mild subjective decrease left leg, mild decreased strength left leg versus right however no leg drift. Patient has subtle left arm drift, no obvious facial droop, neck supple  Skin: Skin is warm and dry. No rash noted.  Psychiatric: She has a normal mood and affect. Her behavior is normal.  Nursing note and vitals reviewed.   ED Course  Procedures (including critical care time)  DIAGNOSTIC STUDIES: Oxygen Saturation is 100% on RA, normal by my interpretation.    COORDINATION OF CARE: 2:35 PM - Discussed treatment plan with pt at bedside which includes CT head, blood work, neuro consult and pt agreed to plan.   Labs Review Labs Reviewed  COMPREHENSIVE METABOLIC PANEL - Abnormal; Notable for the following:    Glucose, Bld 100 (*)    GFR calc non Af Amer 89 (*)    All other components within normal  limits  PROTIME-INR  APTT  CBC  DIFFERENTIAL  I-STAT TROPOININ, ED    Imaging Review Ct Angio Head W/cm &/or Wo Cm  11/02/2014   CLINICAL DATA:  63 year old female with left facial numbness, code stroke. Left side numbness. Initial encounter.  EXAM: CT ANGIOGRAPHY HEAD AND NECK  TECHNIQUE: Multidetector CT imaging of the head and neck was performed using the standard protocol during bolus administration of intravenous contrast. Multiplanar CT image reconstructions and MIPs were obtained to evaluate the vascular anatomy. Carotid stenosis measurements (when applicable) are obtained utilizing NASCET criteria, using the distal internal carotid diameter as the denominator.  CONTRAST:  113m OMNIPAQUE IOHEXOL 350 MG/ML SOLN  COMPARISON:  Head CT without contrast 1108 hr today. Intracranial MRA 07/10/2013.  FINDINGS: CTA HEAD FINDINGS  Stable gray-white matter differentiation from earlier today. No cytotoxic edema identified. No intracranial mass effect or hemorrhage identified. No abnormal enhancement identified.  VASCULAR FINDINGS:  Major intracranial venous structures are enhancing.  Diminutive proximal vertebrobasilar system. The non dominant distal right vertebral artery terminates in the right PICA. The left PICA is patent. Thread-like proximal basilar artery. Persistent right trigeminal artery supplying the distal basilar. SCA and left PCA origins are within normal limits. There is also a fetal type right PCA origin. The left posterior communicating artery also is present. Bilateral PCA branches are within normal limits.  No ICA siphon atherosclerosis or stenosis. Persistent right trigeminal artery. Ophthalmic and posterior communicating artery origins are within normal limits. There is an infundibulum at the left PComm origin.  Normal carotid termini. Normal MCA and ACA origins. Diminutive anterior communicating artery. Bilateral ACA branches are within normal limits. Left MCA branches are within normal  limits.  Right MCA M1 segment is within normal limits. The right MCA bifurcation is patent. The right MCA anterior division branches appear diminutive compared to those on the left (series 602, image 49). No major branch occlusion is identified.  Review of the MIP images confirms the above findings.  CTA NECK FINDINGS  Paraseptal emphysema and apical scarring in the lung apices. No superior mediastinal lymphadenopathy. Negative thyroid, larynx, pharynx, parapharyngeal spaces, retropharyngeal space, sublingual space, submandibular glands, and parotid glands. Postoperative changes to the globes. Negative paranasal sinuses and mastoids. Dentition absent. No acute osseous abnormality identified. No cervical lymphadenopathy.  VASCULAR FINDINGS:  Normal anatomic variation of the aortic arch anatomy. There is a shared common carotid artery origin, and aberrant origin of the right subclavian artery. No arch atherosclerosis or great vessel origin stenosis.  Both common carotid origins are normally patent.  Intermittent motion artifact in the neck. Normal right CCA and right carotid bifurcation. Normal cervical right ICA.  Aberrant origin of the right subclavian artery passing posterior to the trachea and esophagus at the thoracic inlet no proximal right subclavian artery stenosis. Normal right vertebral artery origin. The right vertebral artery is non dominant and diminutive throughout the neck, but patent to the skullbase.  No proximal left subclavian artery stenosis. Normal left vertebral artery origin. Mildly dominant left vertebral artery is normal to the skullbase.  Review of the MIP images confirms the above findings.  IMPRESSION: 1. No major circle of Willis branch occlusion or proximal stenosis, but the right MCA anterior division vessels do appear diminutive compared to those on the left. No changes of anterior left MCA ischemia are evident on CT at this time. 2. Multiple anatomic variations including a persistent  right trigeminal artery, a shared common carotid artery origin off of the aortic arch, and an aberrant origin of the right subclavian artery. 3. Otherwise negative CTA with no atherosclerosis or stenosis in the neck.   Electronically Signed   By: Lars Pinks M.D.   On: 11/02/2014 13:27   Ct Head Wo Contrast  11/02/2014   CLINICAL DATA:  Code stroke.  Left-sided numbness.  EXAM: CT HEAD WITHOUT CONTRAST  TECHNIQUE: Contiguous axial images were obtained from the base of the skull through the vertex without intravenous contrast.  COMPARISON:  CT brain 07/10/2013  FINDINGS: Ventricles and sulci are appropriate for patient's age. No evidence for acute cortically based infarct, intracranial hemorrhage, mass lesion or mass effect. 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 Paranasal sinuses are unremarkable. Mastoid air cells are well aerated. Calvarium is intact. Orbits are unremarkable.  IMPRESSION: No  acute intracranial process.  Critical Value/emergent results were called by telephone at the time of interpretation on 11/02/2014 at 11:12 am to Dr. Elnora Morrison , who verbally acknowledged these results.   Electronically Signed   By: Lovey Newcomer M.D.   On: 11/02/2014 11:15   Ct Angio Neck W/cm &/or Wo/cm  11/02/2014   CLINICAL DATA:  63 year old female with left facial numbness, code stroke. Left side numbness. Initial encounter.  EXAM: CT ANGIOGRAPHY HEAD AND NECK  TECHNIQUE: Multidetector CT imaging of the head and neck was performed using the standard protocol during bolus administration of intravenous contrast. Multiplanar CT image reconstructions and MIPs were obtained to evaluate the vascular anatomy. Carotid stenosis measurements (when applicable) are obtained utilizing NASCET criteria, using the distal internal carotid diameter as the denominator.  CONTRAST:  17m OMNIPAQUE IOHEXOL 350 MG/ML SOLN  COMPARISON:  Head CT without contrast 1108 hr today. Intracranial MRA 07/10/2013.  FINDINGS: CTA HEAD FINDINGS   Stable gray-white matter differentiation from earlier today. No cytotoxic edema identified. No intracranial mass effect or hemorrhage identified. No abnormal enhancement identified.  VASCULAR FINDINGS:  Major intracranial venous structures are enhancing.  Diminutive proximal vertebrobasilar system. The non dominant distal right vertebral artery terminates in the right PICA. The left PICA is patent. Thread-like proximal basilar artery. Persistent right trigeminal artery supplying the distal basilar. SCA and left PCA origins are within normal limits. There is also a fetal type right PCA origin. The left posterior communicating artery also is present. Bilateral PCA branches are within normal limits.  No ICA siphon atherosclerosis or stenosis. Persistent right trigeminal artery. Ophthalmic and posterior communicating artery origins are within normal limits. There is an infundibulum at the left PComm origin.  Normal carotid termini. Normal MCA and ACA origins. Diminutive anterior communicating artery. Bilateral ACA branches are within normal limits. Left MCA branches are within normal limits.  Right MCA M1 segment is within normal limits. The right MCA bifurcation is patent. The right MCA anterior division branches appear diminutive compared to those on the left (series 602, image 49). No major branch occlusion is identified.  Review of the MIP images confirms the above findings.  CTA NECK FINDINGS  Paraseptal emphysema and apical scarring in the lung apices. No superior mediastinal lymphadenopathy. Negative thyroid, larynx, pharynx, parapharyngeal spaces, retropharyngeal space, sublingual space, submandibular glands, and parotid glands. Postoperative changes to the globes. Negative paranasal sinuses and mastoids. Dentition absent. No acute osseous abnormality identified. No cervical lymphadenopathy.  VASCULAR FINDINGS:  Normal anatomic variation of the aortic arch anatomy. There is a shared common carotid artery origin,  and aberrant origin of the right subclavian artery. No arch atherosclerosis or great vessel origin stenosis.  Both common carotid origins are normally patent.  Intermittent motion artifact in the neck. Normal right CCA and right carotid bifurcation. Normal cervical right ICA.  Aberrant origin of the right subclavian artery passing posterior to the trachea and esophagus at the thoracic inlet no proximal right subclavian artery stenosis. Normal right vertebral artery origin. The right vertebral artery is non dominant and diminutive throughout the neck, but patent to the skullbase.  No proximal left subclavian artery stenosis. Normal left vertebral artery origin. Mildly dominant left vertebral artery is normal to the skullbase.  Review of the MIP images confirms the above findings.  IMPRESSION: 1. No major circle of Willis branch occlusion or proximal stenosis, but the right MCA anterior division vessels do appear diminutive compared to those on the left. No changes of anterior left MCA  ischemia are evident on CT at this time. 2. Multiple anatomic variations including a persistent right trigeminal artery, a shared common carotid artery origin off of the aortic arch, and an aberrant origin of the right subclavian artery. 3. Otherwise negative CTA with no atherosclerosis or stenosis in the neck.   Electronically Signed   By: Lars Pinks M.D.   On: 11/02/2014 13:27     EKG Interpretation None      MDM   Final diagnoses:  Left facial numbness  Left-sided weakness   Patient presents with left-sided symptoms and stroke history. Patient with thin time window, code stroke call, symptoms and signs currently mild in the ER. Neurology emergent consult obtained, discussed with neurology triage nurse. CT head results discussed with radiology no acute bleed no acute finding. Blood work pending and likely plan to transfer to [facility] for further neurology workup.  Patient symptoms improved in ER however on recheck  still feels subjective left sided numbness and weakness. Telemetry neurologist evaluated the patient and recommended CT Angio head neck and observation the hospital. CT results reviewed no acute findings. Discussed with Dr. Doy Mince neurologist at [facility] who recommended observation locally or transfer however no acute intervention needed and patient may stay at [facility]. Patient comfortable this plan, hospitalist down to see the patient in ER.  The patients results and plan were reviewed and discussed.   Any x-rays performed were personally reviewed by myself.   Differential diagnosis were considered with the presenting HPI.  Medications  sodium chloride 0.9 % injection (not administered)  aspirin chewable tablet 324 mg (324 mg Oral Given 11/02/14 1213)  iohexol (OMNIPAQUE) 350 MG/ML injection 100 mL (100 mLs Intravenous Contrast Given 11/02/14 1245)    Filed Vitals:   11/02/14 1056 11/02/14 1107  BP: 123/62   Pulse: 75   Temp: 98.1 F (36.7 C)   TempSrc: Oral   Resp: 15   Height: 5' 3"  (1.6 m) 5' 3"  (1.6 m)  Weight:  113 lb (51.256 kg)  SpO2: 100%     Final diagnoses:  Left facial numbness  Left-sided weakness    Admission/ observation were discussed with the admitting physician, patient and/or family and they are comfortable with the plan.    Mariea Clonts, MD 11/02/14 [phone]

## 2014-11-02 NOTE — ED Notes (Signed)
Pt transported to CT ?

## 2014-11-02 NOTE — ED Notes (Signed)
Tele Neuro consult ended.

## 2014-11-02 NOTE — ED Notes (Addendum)
Patient was getting ready for work, had a sharp pain in left side of face that caused vision changes; reports numbness in left face;

## 2014-11-03 ENCOUNTER — Observation Stay (HOSPITAL_COMMUNITY): Payer: BC Managed Care – PPO

## 2014-11-03 DIAGNOSIS — I639 Cerebral infarction, unspecified: Secondary | ICD-10-CM

## 2014-11-03 DIAGNOSIS — Z720 Tobacco use: Secondary | ICD-10-CM

## 2014-11-03 LAB — LIPID PANEL
CHOLESTEROL: 144 mg/dL (ref 0–200)
HDL: 76 mg/dL (ref >39)
LDL CALC: 57 mg/dL (ref 0–99)
Total CHOL/HDL Ratio: 1.9 RATIO
Triglycerides: 56 mg/dL (ref <150)
VLDL: 11 mg/dL (ref 0–40)

## 2014-11-03 LAB — CBC
HCT: 39.7 % (ref 36.0–46.0)
HEMOGLOBIN: 12.8 g/dL (ref 12.0–15.0)
MCH: 30.1 pg (ref 26.0–34.0)
MCHC: 32.2 g/dL (ref 30.0–36.0)
MCV: 93.4 fL (ref 78.0–100.0)
Platelets: 309 10*3/uL (ref 150–400)
RBC: 4.25 MIL/uL (ref 3.87–5.11)
RDW: 14.5 % (ref 11.5–15.5)
WBC: 8.8 10*3/uL (ref 4.0–10.5)

## 2014-11-03 LAB — BASIC METABOLIC PANEL
Anion gap: 12 (ref 5–15)
BUN: 12 mg/dL (ref 6–23)
CHLORIDE: 105 meq/L (ref 96–112)
CO2: 24 mEq/L (ref 19–32)
Calcium: 8.7 mg/dL (ref 8.4–10.5)
Creatinine, Ser: 0.71 mg/dL (ref 0.50–1.10)
GFR calc Af Amer: >90 mL/min (ref >90)
GFR, EST NON AFRICAN AMERICAN: 90 mL/min — AB (ref >90)
Glucose, Bld: 110 mg/dL — ABNORMAL HIGH (ref 70–99)
Potassium: 4.3 mEq/L (ref 3.7–5.3)
Sodium: 141 mEq/L (ref 137–147)

## 2014-11-03 LAB — T4, FREE: Free T4: 0.91 ng/dL (ref 0.80–1.80)

## 2014-11-03 LAB — VITAMIN B12: Vitamin B-12: 226 pg/mL (ref 211–911)

## 2014-11-03 MED ORDER — ATORVASTATIN CALCIUM 20 MG PO TABS: 20.0000 mg | ORAL_TABLET | Freq: Every day | ORAL | Status: DC

## 2014-11-03 MED ORDER — ASPIRIN 325 MG PO TABS
325.0000 mg | ORAL_TABLET | Freq: Every day | ORAL | Status: DC
Start: 2014-11-03 — End: 2022-09-03

## 2014-11-03 NOTE — Progress Notes (Signed)
Echocardiogram 2D Echocardiogram has been performed.  Doyle Askew 11/03/2014, 9:50 AM

## 2014-11-03 NOTE — Progress Notes (Signed)
UR completed 

## 2014-11-03 NOTE — Progress Notes (Signed)
Discharge instruction reviewed with patient. Work note given to patient. Saline lock removed. Patient states she is not willing to stay for MRI tomorrow. Patient ambulating in room no distress noted. Patient able to dress self waiting for ride home.

## 2014-11-03 NOTE — Discharge Summary (Addendum)
Physician Discharge Summary  Stacy Hansen:[id number] DOB: [birth year] DOA: 11/02/2014  PCP: Stacy Aly, FNP  Admit date: 11/02/2014 Discharge date: 11/03/2014  Recommendations for Outpatient Follow-up:  1. Left-sided weakness of unclear etiology. Consider TIA. See further discussion below. Consider outpatient MRI brain, MRA head. Patient was not willing to stay until 12/21 for this procedure to be performed. 2. Recommend outpatient neurology follow-up as below 3. History of aneurysm, not commented upon on most recent studies. Follow-up as an outpatient. 4. Continue to encourage smoking cessation    Follow-up Information    Follow up with Stacy Aly, FNP. Schedule an appointment as soon as possible for a visit in 1 week.   Specialty:  Nurse Practitioner   Contact information:   [place] [address] [phone]       Follow up with Stacy Hansen, Stacy Pickerel, MD.   Specialty:  Interventional Radiology   Why:  office will call you with appt   Contact information:   [address], [address] [place] [address] [phone]       Follow up with [facility].   Why:  office will call you with appt   Contact information:   [address] [place] [address] [phone]     Discharge Diagnoses:  1. Recurrent left-sided weakness and numbness with associated visual changes, unclear etiology, possible TIA versus small CVA versus complex migraine. 2. History of right basilar region aneurysm. 3. Tobacco dependence.  Discharge Condition: Improved Disposition: Home  Diet recommendation: Heart healthy  Filed Weights   11/02/14 1107 11/02/14 1623 11/03/14 0513  Weight: 51.256 kg (113 lb) 52.617 kg (116 lb) 48.399 kg (106 lb 11.2 oz)    History of present illness:  63 year old woman presented with sudden onset of left-sided weakness, left facial numbness and visual changes. Evaluated by  tele-neurology in ED, recommendation for CTA angiogram head and neck. Noted to have decreased vision of the left eye by EDP with intact EOM, decreased strength left leg. Subtle left arm drift. Per admitting MD, decreased sensation left side of face. Slightly decreased strength left lower extremity compared to right lower extremity.  Hansen Course:  [name] was admitted overnight. She reported improvement in paresthesias of the left side with improved strength in her left leg. She has been ambulating without difficulty. No ataxia.  To the bathroom without difficulty. On exam she had no definite focal deficits. Mild left lower sternal weakness compared to the right. She ambulated without difficulty. She had no need identified for physical or occupational therapy. No speech difficulties or swallowing difficulties noted. Of note she had been hospitalized in August 2014 with a very similar presentation as below. MRI and MRA of the brain was plan this hospitalization but is not available until 12/21 and the patient is not willing to wait. Given her CT angiogram was unremarkable and she only has subtle neurologic deficits of unclear significance plan for discharge home with outpatient follow-up. She knows to follow-up with her outpatient physician for consideration of further imaging. Also recommended follow-up with neurology as well as interventional radiology for her history of aneurysm.  1. Recurrent left-sided weakness, numbness with associated visual changes, dizziness. Suspect TIA versus complex migraine, consider small stream. CT of the cervical spine was unremarkable. Presentation similar to August 2014, consider complex migraine; EEG was unremarkable at that time. LDL 57. Hemoglobin A1c 6.1. TSH 1.18. EKG. Sinus rhythm. 2. Anxiety  3. Known right basilar region aneursym.  Not commented upon on CT angiogram. 4. Tobacco dependence. Recommend cessation.   Full strength aspirin. Add statin.    followup with Dr. Kathee Hansen and Stacy Hansen neurology  Work note, can return 12/26  Procedures:  2-D echocardiogram:  Study Conclusions  - Left ventricle: The cavity size was normal. Wall thickness was normal. Systolic function was vigorous. The estimated ejection fraction was in the range of 65% to 70%. Wall motion was normal; there were no regional wall motion abnormalities. Left ventricular diastolic function parameters were normal. - Left atrium: The atrium was normal in size.  Impressions:  - Normal study.   Discharge Instructions  Discharge Instructions    Activity as tolerated - No restrictions    Complete by:  As directed      Diet - low sodium heart healthy    Complete by:  As directed      Discharge instructions    Complete by:  As directed   Call your physician or seek immediate medical attention for weakness, numbness, difficulty speaking or swallowing or worsening of condition.          Current Discharge Medication List    START taking these medications   Details  aspirin 325 MG tablet Take 1 tablet (325 mg total) by mouth daily.    atorvastatin (LIPITOR) 20 MG tablet Take 1 tablet (20 mg total) by mouth daily at 6 PM. Qty: 30 tablet, Refills: 0      CONTINUE these medications which have NOT CHANGED   Details  albuterol (PROVENTIL HFA;VENTOLIN HFA) 108 (90 BASE) MCG/ACT inhaler Inhale 2 puffs into the lungs every 6 (six) hours as needed for wheezing or shortness of breath.    LORazepam (ATIVAN) 0.5 MG tablet Take 0.5 mg by mouth at bedtime as needed for anxiety or sleep (Allowed up to 3 times daily as needed for anxiety). For anxiety    carboxymethylcellulose (REFRESH PLUS) 0.5 % SOLN Apply 1 drop to eye 3 (three) times daily as needed (dry/itching eyes).     meclizine (ANTIVERT) 25 MG tablet Take 25 mg by mouth every 8 (eight) hours as needed (vertigo). Nausea    Phenyleph-Doxylamine-DM-APAP (ALKA-SELTZER PLS ALLERGY & CGH PO) Take 1  packet by mouth daily as needed (for cold and cough symptoms).     traMADol (ULTRAM) 50 MG tablet Take 1 tablet (50 mg total) by mouth every 6 (six) hours as needed. Qty: 20 tablet, Refills: 0      STOP taking these medications     aspirin EC 81 MG tablet        Allergies  Allergen Reactions  . Ciprofloxacin Other (See Comments)    Unknown reaction per pt  . Meperidine Hcl Nausea And Vomiting and Other (See Comments)    Causes blood pressure to drop when taken with Phenergan through IV  . Opium Other (See Comments)    Causes blood pressure to drop when taken with Phenergan through IV  . Promethazine Hcl Nausea And Vomiting and Other (See Comments)    Causes blood pressure to drop when taken with Demeral via IV  . Propoxyphene N-Acetaminophen Other (See Comments)    "pass out"    The results of significant diagnostics from this hospitalization (including imaging, microbiology, ancillary and laboratory) are listed below for reference.    Significant Diagnostic Studies: Ct Angio Head W/cm &/or Wo Cm  11/02/2014   CLINICAL DATA:  63 year old female with left facial numbness, code stroke. Left side numbness. Initial encounter.  EXAM: CT ANGIOGRAPHY  HEAD AND NECK  TECHNIQUE: Multidetector CT imaging of the head and neck was performed using the standard protocol during bolus administration of intravenous contrast. Multiplanar CT image reconstructions and MIPs were obtained to evaluate the vascular anatomy. Carotid stenosis measurements (when applicable) are obtained utilizing NASCET criteria, using the distal internal carotid diameter as the denominator.  CONTRAST:  129m OMNIPAQUE IOHEXOL 350 MG/ML SOLN  COMPARISON:  Head CT without contrast 1108 hr today. Intracranial MRA 07/10/2013.  FINDINGS: CTA HEAD FINDINGS  Stable gray-white matter differentiation from earlier today. No cytotoxic edema identified. No intracranial mass effect or hemorrhage identified. No abnormal enhancement  identified.  VASCULAR FINDINGS:  Major intracranial venous structures are enhancing.  Diminutive proximal vertebrobasilar system. The non dominant distal right vertebral artery terminates in the right PICA. The left PICA is patent. Thread-like proximal basilar artery. Persistent right trigeminal artery supplying the distal basilar. SCA and left PCA origins are within normal limits. There is also a fetal type right PCA origin. The left posterior communicating artery also is present. Bilateral PCA branches are within normal limits.  No ICA siphon atherosclerosis or stenosis. Persistent right trigeminal artery. Ophthalmic and posterior communicating artery origins are within normal limits. There is an infundibulum at the left PComm origin.  Normal carotid termini. Normal MCA and ACA origins. Diminutive anterior communicating artery. Bilateral ACA branches are within normal limits. Left MCA branches are within normal limits.  Right MCA M1 segment is within normal limits. The right MCA bifurcation is patent. The right MCA anterior division branches appear diminutive compared to those on the left (series 602, image 49). No major branch occlusion is identified.  Review of the MIP images confirms the above findings.  CTA NECK FINDINGS  Paraseptal emphysema and apical scarring in the lung apices. No superior mediastinal lymphadenopathy. Negative thyroid, larynx, pharynx, parapharyngeal spaces, retropharyngeal space, sublingual space, submandibular glands, and parotid glands. Postoperative changes to the globes. Negative paranasal sinuses and mastoids. Dentition absent. No acute osseous abnormality identified. No cervical lymphadenopathy.  VASCULAR FINDINGS:  Normal anatomic variation of the aortic arch anatomy. There is a shared common carotid artery origin, and aberrant origin of the right subclavian artery. No arch atherosclerosis or great vessel origin stenosis.  Both common carotid origins are normally patent.   Intermittent motion artifact in the neck. Normal right CCA and right carotid bifurcation. Normal cervical right ICA.  Aberrant origin of the right subclavian artery passing posterior to the trachea and esophagus at the thoracic inlet no proximal right subclavian artery stenosis. Normal right vertebral artery origin. The right vertebral artery is non dominant and diminutive throughout the neck, but patent to the skullbase.  No proximal left subclavian artery stenosis. Normal left vertebral artery origin. Mildly dominant left vertebral artery is normal to the skullbase.  Review of the MIP images confirms the above findings.  IMPRESSION: 1. No major circle of Willis branch occlusion or proximal stenosis, but the right MCA anterior division vessels do appear diminutive compared to those on the left. No changes of anterior left MCA ischemia are evident on CT at this time. 2. Multiple anatomic variations including a persistent right trigeminal artery, a shared common carotid artery origin off of the aortic arch, and an aberrant origin of the right subclavian artery. 3. Otherwise negative CTA with no atherosclerosis or stenosis in the neck.   Electronically Signed   By: LLars PinksM.D.   On: 11/02/2014 13:27   Ct Head Wo Contrast  11/02/2014   CLINICAL DATA:  Code stroke.  Left-sided numbness.  EXAM: CT HEAD WITHOUT CONTRAST  TECHNIQUE: Contiguous axial images were obtained from the base of the skull through the vertex without intravenous contrast.  COMPARISON:  CT brain 07/10/2013  FINDINGS: Ventricles and sulci are appropriate for patient's age. No evidence for acute cortically based infarct, intracranial hemorrhage, mass lesion or mass effect. 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 Paranasal sinuses are unremarkable. Mastoid air cells are well aerated. Calvarium is intact. Orbits are unremarkable.  IMPRESSION: No acute intracranial process.  Critical Value/emergent results were called by telephone at the time of  interpretation on 11/02/2014 at 11:12 am to Dr. Elnora Morrison , who verbally acknowledged these results.   Electronically Signed   By: Lovey Newcomer M.D.   On: 11/02/2014 11:15   Ct Angio Neck W/cm &/or Wo/cm  11/02/2014   CLINICAL DATA:  63 year old female with left facial numbness, code stroke. Left side numbness. Initial encounter.  EXAM: CT ANGIOGRAPHY HEAD AND NECK  TECHNIQUE: Multidetector CT imaging of the head and neck was performed using the standard protocol during bolus administration of intravenous contrast. Multiplanar CT image reconstructions and MIPs were obtained to evaluate the vascular anatomy. Carotid stenosis measurements (when applicable) are obtained utilizing NASCET criteria, using the distal internal carotid diameter as the denominator.  CONTRAST:  153m OMNIPAQUE IOHEXOL 350 MG/ML SOLN  COMPARISON:  Head CT without contrast 1108 hr today. Intracranial MRA 07/10/2013.  FINDINGS: CTA HEAD FINDINGS  Stable gray-white matter differentiation from earlier today. No cytotoxic edema identified. No intracranial mass effect or hemorrhage identified. No abnormal enhancement identified.  VASCULAR FINDINGS:  Major intracranial venous structures are enhancing.  Diminutive proximal vertebrobasilar system. The non dominant distal right vertebral artery terminates in the right PICA. The left PICA is patent. Thread-like proximal basilar artery. Persistent right trigeminal artery supplying the distal basilar. SCA and left PCA origins are within normal limits. There is also a fetal type right PCA origin. The left posterior communicating artery also is present. Bilateral PCA branches are within normal limits.  No ICA siphon atherosclerosis or stenosis. Persistent right trigeminal artery. Ophthalmic and posterior communicating artery origins are within normal limits. There is an infundibulum at the left PComm origin.  Normal carotid termini. Normal MCA and ACA origins. Diminutive anterior communicating artery.  Bilateral ACA branches are within normal limits. Left MCA branches are within normal limits.  Right MCA M1 segment is within normal limits. The right MCA bifurcation is patent. The right MCA anterior division branches appear diminutive compared to those on the left (series 602, image 49). No major branch occlusion is identified.  Review of the MIP images confirms the above findings.  CTA NECK FINDINGS  Paraseptal emphysema and apical scarring in the lung apices. No superior mediastinal lymphadenopathy. Negative thyroid, larynx, pharynx, parapharyngeal spaces, retropharyngeal space, sublingual space, submandibular glands, and parotid glands. Postoperative changes to the globes. Negative paranasal sinuses and mastoids. Dentition absent. No acute osseous abnormality identified. No cervical lymphadenopathy.  VASCULAR FINDINGS:  Normal anatomic variation of the aortic arch anatomy. There is a shared common carotid artery origin, and aberrant origin of the right subclavian artery. No arch atherosclerosis or great vessel origin stenosis.  Both common carotid origins are normally patent.  Intermittent motion artifact in the neck. Normal right CCA and right carotid bifurcation. Normal cervical right ICA.  Aberrant origin of the right subclavian artery passing posterior to the trachea and esophagus at the thoracic inlet no proximal right subclavian artery stenosis. Normal right vertebral artery  origin. The right vertebral artery is non dominant and diminutive throughout the neck, but patent to the skullbase.  No proximal left subclavian artery stenosis. Normal left vertebral artery origin. Mildly dominant left vertebral artery is normal to the skullbase.  Review of the MIP images confirms the above findings.  IMPRESSION: 1. No major circle of Willis branch occlusion or proximal stenosis, but the right MCA anterior division vessels do appear diminutive compared to those on the left. No changes of anterior left MCA ischemia are  evident on CT at this time. 2. Multiple anatomic variations including a persistent right trigeminal artery, a shared common carotid artery origin off of the aortic arch, and an aberrant origin of the right subclavian artery. 3. Otherwise negative CTA with no atherosclerosis or stenosis in the neck.   Electronically Signed   By: Lars Pinks M.D.   On: 11/02/2014 13:27   Ct Cervical Spine Wo Contrast  11/02/2014   CLINICAL DATA:  Left-sided weakness and numbness beginning today.  EXAM: CT CERVICAL SPINE WITHOUT CONTRAST  TECHNIQUE: Multidetector CT imaging of the cervical spine was performed without intravenous contrast. Multiplanar CT image reconstructions were also generated.  COMPARISON:  None.  FINDINGS: There is no visible cervical spine fracture, traumatic subluxation, prevertebral soft tissue swelling, or intraspinal hematoma. Slight reversal normal cervical lordotic curve. No soft tissue abnormality. Moderate scarring and emphysematous change at the lung apices.  IMPRESSION: Unremarkable CT cervical spine without contrast. No gross intraspinal lesion is observed, and there is no appreciable left-sided foraminal narrowing.   Electronically Signed   By: Rolla Flatten M.D.   On: 11/02/2014 17:56   US Carotid Bilateral  11/03/2014   CLINICAL DATA:  Left-sided weakness  EXAM: BILATERAL CAROTID DUPLEX ULTRASOUND  TECHNIQUE: Pearline Cables scale imaging, color Doppler and duplex ultrasound were performed of bilateral carotid and vertebral arteries in the neck.  COMPARISON:  None.  FINDINGS: Criteria: Quantification of carotid stenosis is based on velocity parameters that correlate the residual internal carotid diameter with NASCET-based stenosis levels, using the diameter of the distal internal carotid lumen as the denominator for stenosis measurement.  The following peak systolic velocity measurements were obtained:  RIGHT  ICA:  90 cm/sec  CCA:  71 cm/sec  SYSTOLIC ICA/CCA RATIO:  1.3  DIASTOLIC ICA/CCA RATIO:  1.8   ECA:  81 cm/sec  LEFT  ICA:  97 cm/sec  CCA:  72 cm/sec  SYSTOLIC ICA/CCA RATIO:  1.4  DIASTOLIC ICA/CCA RATIO:  1.9  ECA:  72 cm/sec  RIGHT CAROTID ARTERY: There is intimal thickening diffusely. There is smooth plaque in the distal right common carotid artery extending into the bifurcation and proximal right internal carotid artery causing approximately 20-25% diameter stenosis. No stenosis approaching 50% diameter is seen by real-time interrogation.  RIGHT VERTEBRAL ARTERY:  Flow is antegrade.  LEFT CAROTID ARTERY: There is mild generalized intimal thickening. There is smooth plaque in the distal common carotid artery extending into the bifurcation and proximal internal carotid artery causing approximately 20% diameter stenosis. No stenosis approaching 50% diameter is seen.  LEFT VERTEBRAL ARTERY:  Flow is antegrade.  IMPRESSION: There is mild plaque formation in both bifurcation and proximal internal carotid artery regions. No stenosis approaching 50% diameter seen on either side. Flow in both vertebral arteries is in the anatomic direction.   Electronically Signed   By: Lowella Grip M.D.   On: 11/03/2014 10:58     Labs: Basic Metabolic Panel:  Recent Labs Lab 11/02/14 1049 11/03/14 0631  NA 139 141  K 4.1 4.3  CL 101 105  CO2 26 24  GLUCOSE 100* 110*  BUN 9 12  CREATININE 0.74 0.71  CALCIUM 9.2 8.7   Liver Function Tests:  Recent Labs Lab 11/02/14 1049  AST 17  ALT 9  ALKPHOS 71  BILITOT 0.3  PROT 6.9  ALBUMIN 3.8   CBC:  Recent Labs Lab 11/02/14 1049 11/03/14 0631  WBC 9.4 8.8  NEUTROABS 7.1  --   HGB 13.0 12.8  HCT 40.7 39.7  MCV 93.6 93.4  PLT 299 309   Cardiac Enzymes:  Recent Labs Lab 11/02/14 1532  TROPONINI <0.30    Principal Problem:   Left-sided weakness Active Problems:   Subjective visual disturbance   Left sided numbness   Tobacco use   Bradycardia   Brain aneurysm   Crohn disease   Fibromyalgia   Chronic anxiety   Time  coordinating discharge: 35 minutes  Signed:  Murray Hodgkins, MD [facility] 11/03/2014, 4:12 PM

## 2014-11-03 NOTE — Progress Notes (Signed)
PROGRESS NOTE  Stacy Hansen:[id number] DOB: [birth year] DOA: 11/02/2014 PCP: Vicenta Aly, FNP  Summary: 63 year old woman presented with sudden onset of left-sided weakness, left facial numbness and visual changes. Evaluated by tele-neurology in ED, recommendation for CTA angiogram head and neck.  Noted to have decreased vision of the left eye by EDP with intact EOM, decreased strength left leg. Subtle left arm drift. Per admitting MD, decreased sensation left side of face. Slightly decreased strength left lower extremity compared to right lower extremity.  Assessment/Plan: 1. Recurrent left-sided weakness, numbness with associated visual changes, dizziness. Suspect TIA. MRI/A pending. CT of the cervical spine was unremarkable. Presentation similar to August 2014, consider complex migraine; EEG was unremarkable at that time. LDL 57. Hemoglobin A1c 6.1. TSH 1.18. EKG. Sinus rhythm. 2. Anxiety  3. Known right basilar region aneursym. MRI pending. 4. Tobacco dependence. Recommend cessation.   Full strength aspirin. Add statin.  Follow-up MRI brain, MRA head, 2-D echocardiogram, carotid ultrasound.  Telemetry   Follow-up TSH, vitamin B12  followup with Dr. Kathee Delton and [facility] neurology  Work note, can return 12/26  Can go home if above unremarkable  Code Status: full code DVT prophylaxis: refused heparin Family Communication: discussed with husband at bedside  Disposition Plan: home  Murray Hodgkins, MD  [facility]  Pager [phone] If 7PM-7AM, please contact night-coverage at [email or web address], password [facility] 11/03/2014, 10:35 AM  LOS: 1 day   Consultants:    Procedures:  2-D echocardiogram: pending  Antibiotics:    HPI/Subjective: Patient wanted to leave yesterday to smoke, refused heparin SQ.  Feels good today, eating fine.  Neuro: Weakness: slight left leg weakness Paraesthesias ROS: some left facial numbness Other: ambulating well, no  ataxia   Objective: Filed Vitals:   11/02/14 1623 11/02/14 2154 11/02/14 2321 11/03/14 0513  BP: 96/44 101/45  102/43  Pulse: 68 76  72  Temp: 98 F (36.7 C) 98.2 F (36.8 C)  97.6 F (36.4 C)  TempSrc: Oral Oral  Oral  Resp: 16 16  20   Height: 5' 3"  (1.6 m)     Weight: 52.617 kg (116 lb)   48.399 kg (106 lb 11.2 oz)  SpO2: 98% 98% 96% 97%   No intake or output data in the 24 hours ending 11/03/14 1035   Filed Weights   11/02/14 1107 11/02/14 1623 11/03/14 0513  Weight: 51.256 kg (113 lb) 52.617 kg (116 lb) 48.399 kg (106 lb 11.2 oz)    Exam:     Afebrile, vital signs are stable. No hypoxia.  General: appears calm and comfortable, ambulating in room  Psych: alert, speech fluent and clear  CV: RRR no m/r/g. No LE edema.  Respiratory: CTA bilaterally, no w/r/r. Normal resp effort  Skin: grossly unremarkable  Musculoskeletal: moves all ext well, slight decreased LLE strength compared ro RLE; BUE strength equal, 5/5. Face symmetric, CN intact  Data Reviewed:  Basic metabolic panel unremarkable.  Pertinent data:  Imaging: CT cervical spine unremarkable.CT angiography head and neck: no evidence of occlusion or proximal stenosis.no evidence of mass or hemorrhage. CT head: No acute intracranial process.  Laboratory: LDL 57. Hemoglobin A1c 6.1. TSH 1.18.  EKG. Sinus rhythm.  Scheduled Meds: . aspirin  325 mg Oral Daily  . heparin  5,000 Units Subcutaneous Q8H  . sodium chloride  3 mL Intravenous Q12H   Continuous Infusions:   Principal Problem:   Left-sided weakness Active Problems:   Subjective visual disturbance   Left sided numbness   Tobacco use  Bradycardia   Brain aneurysm   Crohn disease   Fibromyalgia   Chronic anxiety

## 2014-11-21 ENCOUNTER — Other Ambulatory Visit: Payer: Self-pay | Admitting: Nurse Practitioner

## 2014-11-21 ENCOUNTER — Encounter: Payer: Self-pay | Admitting: Neurology

## 2014-11-21 ENCOUNTER — Ambulatory Visit (INDEPENDENT_AMBULATORY_CARE_PROVIDER_SITE_OTHER): Payer: BLUE CROSS/BLUE SHIELD | Admitting: Neurology

## 2014-11-21 VITALS — BP 107/64 | HR 66 | Ht 63.0 in | Wt 114.0 lb

## 2014-11-21 DIAGNOSIS — M79605 Pain in left leg: Secondary | ICD-10-CM

## 2014-11-21 DIAGNOSIS — G43109 Migraine with aura, not intractable, without status migrainosus: Secondary | ICD-10-CM

## 2014-11-21 DIAGNOSIS — Z720 Tobacco use: Secondary | ICD-10-CM

## 2014-11-21 DIAGNOSIS — M79604 Pain in right leg: Secondary | ICD-10-CM

## 2014-11-21 DIAGNOSIS — Z8673 Personal history of transient ischemic attack (TIA), and cerebral infarction without residual deficits: Secondary | ICD-10-CM

## 2014-11-21 DIAGNOSIS — G43909 Migraine, unspecified, not intractable, without status migrainosus: Secondary | ICD-10-CM

## 2014-11-21 MED ORDER — BUTALBITAL-APAP-CAFFEINE 50-325-40 MG PO TABS: 1.0000 | ORAL_TABLET | Freq: Four times a day (QID) | ORAL | Status: DC | PRN

## 2014-11-21 NOTE — Progress Notes (Signed)
12/21   PATIENT: Stacy Hansen DOB: 1951-07-17  HISTORICAL  RILEE KNOLL is a 64 Caucasian female, she is referred by her primary care physician, PA Lafe Garin, and emergency room for evaluation of sudden onset visual distortion  She had a history of long-standing smoking, mild elevated glucose A1c 6.1, hypertension,   she reported a history of migraine, many years ago, she has occasionally severe headaches, presented to emergency room for shots, but she only has rare headaches  She had 2 similar recurrent episode, the first one was August 2014, most recent 1 November 02 2014,  In November 02 2014, she finished her breakfast, 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, she had sudden onset zig zag light in her left visual field, visual distortion, Whole-body weakness, more discoordination of her left hand, and the left arm, nauseous, no loss of consciousness, she was able to walk across the hallway, later crawled on the hallway to her husband, who called ambulance, the whole episode last about one hour, she does has intermittent waves of shooting pain at left retro-orbital area  She had extensive evaluations CAT scan of the brain showed no acute lesions  CT angiogram of head and neck showed no significant abnormality other than anatomic variations,There was no mention of aneurysm   Echocardiogram was normal Ultrasound of carotid arteries showed no significant large vessel stenosis  Laboratory showed normal CBC, CMP with exception of elevated glucose, A1c 6.1, normal TSH, low normal B12 229  She was taken to 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 Ciprofloxacin Other (See Comments)    Unknown reaction per pt  . Meperidine Hcl Nausea And Vomiting and Other (See Comments)    Causes blood pressure to drop when taken with Phenergan through IV  . Opium Other (See Comments)    Causes blood pressure to drop when taken with Phenergan through IV  . Promethazine Hcl Nausea And Vomiting and Other (See Comments)    Causes blood pressure to drop when taken with Demeral via IV  . Propoxyphene N-Acetaminophen Other (See Comments)    "pass out"    HOME MEDICATIONS: Current Outpatient Prescriptions on File Prior to Visit  Medication Sig Dispense Refill  . albuterol (PROVENTIL HFA;VENTOLIN HFA) 108 (90 BASE) MCG/ACT inhaler Inhale 2 puffs into the lungs every 6 (six) hours as needed for wheezing or shortness of breath.    Marland Kitchen aspirin 325 MG tablet Take 1 tablet (325 mg total) by mouth daily.    Marland Kitchen atorvastatin (LIPITOR) 20 MG tablet Take 1 tablet (20 mg total) by mouth daily at 6 PM. 30 tablet 0  . carboxymethylcellulose (REFRESH PLUS) 0.5 % SOLN Apply 1 drop to eye 3 (three) times daily as needed (dry/itching eyes).     . LORazepam (ATIVAN) 0.5 MG tablet Take 0.5 mg by mouth at bedtime as needed for anxiety or sleep (Allowed up to 3 times daily as needed for anxiety). For anxiety    . meclizine (ANTIVERT) 25 MG  tablet Take 25 mg by mouth every 8 (eight) hours as needed (vertigo). Nausea    . Phenyleph-Doxylamine-DM-APAP (ALKA-SELTZER PLS ALLERGY & CGH PO) Take 1 packet by mouth daily as needed (for cold and cough symptoms).     . traMADol (ULTRAM) 50 MG tablet Take 1 tablet (50 mg total) by mouth every 6 (six) hours as needed. 20 tablet 0   No current facility-administered medications on file prior to visit.    PAST MEDICAL HISTORY: Past  Medical History  Diagnosis Date  . Atypical chest pain   . Tobacco abuse   . History of fibromyalgia   . Crohn disease   . Depression   . Anxiety   . Brain aneurysm   . Hyperplastic colon polyp   . GERD (gastroesophageal reflux disease)   . Panic attacks   . Stroke   . Inflammatory bowel disease   . Chronic abdominal pain     PAST SURGICAL HISTORY: Past Surgical History  Procedure Laterality Date  . Orif ulnar fracture      left   . Rotator cuff repair      left  . Vaginal hysterectomy      has her ovaries  . Tubal ligation    . Tonsillectomy    . Cataract extraction      bilateral  . Vein removed      right varicose vein    FAMILY HISTORY: Family History  Problem Relation Age of Onset  . Diabetes Mother   . Kidney failure Mother   . Diabetes Brother   . Stroke Father   . Diabetes Sister     x 2  . Colon cancer Brother     in his 50's    SOCIAL HISTORY:  History   Social History  . Marital Status: Married    Spouse Name: N/A    Number of Children: 2  . 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 Smoking status: Current Every Day Smoker -- 0.25 packs/day for 3 years    Types: Cigarettes  . Smokeless tobacco: Never Used     Comment: one pack/ 3 days, quit for a year, Tobacco info given 03/07/13  . Alcohol Use: No  . Drug Use: No  . Sexual Activity: Yes    Birth Control/ Protection: None   Other Topics Concern  . Not on file   Social History Narrative   PHYSICAL EXAM   Filed Vitals:   11/21/14 0900  BP: 107/64  Pulse: 66  Height: 5' 3"  (1.6 m)  Weight: 114 lb (51.71 kg)    Not recorded      Body mass index is 20.2 kg/(m^2).   Generalized: In no acute distress  Neck: Supple, no carotid bruits   Cardiac: Regular rate rhythm  Pulmonary: Clear to auscultation bilaterally  Musculoskeletal: No deformity  Neurological examination  Mentation: Alert oriented to  time, place, history taking, and causual conversation  Cranial nerve II-XII: Pupils were equal round reactive to light. Extraocular movements were full.  Visual field were full on confrontational test. Bilateral fundi were sharp.  Facial sensation and strength were normal. Hearing was intact to finger rubbing bilaterally. Uvula tongue midline.  Head turning and shoulder shrug and were normal and symmetric.Tongue protrusion into cheek strength was normal.  Motor: Normal tone, bulk and strength.  Sensory: Intact to fine touch, pinprick, preserved vibratory sensation, and proprioception at toes.  Coordination:  Normal finger to nose, heel-to-shin bilaterally there was no truncal ataxia  Gait: Rising up from seated position without assistance, normal stance, without trunk ataxia, moderate stride, good arm swing, smooth turning, able to perform tiptoe, and heel walking without difficulty.   Romberg signs: Negative  Deep tendon reflexes: Brachioradialis 2/2, biceps 2/2, triceps 2/2, patellar 2/2, Achilles 2/2, plantar responses were flexor bilaterally.   DIAGNOSTIC DATA (LABS, IMAGING, TESTING) - I reviewed patient records, labs, notes, testing and imaging myself where available.  Lab Results  Component Value Date   WBC 8.8 11/03/2014   HGB 12.8 11/03/2014   HCT 39.7 11/03/2014   MCV 93.4 11/03/2014   PLT 309 11/03/2014      Component Value Date/Time   NA 141 11/03/2014 0631   K 4.3 11/03/2014 0631   CL 105 11/03/2014 0631   CO2 24 11/03/2014 0631   GLUCOSE 110* 11/03/2014 0631   BUN 12 11/03/2014 0631   CREATININE 0.71 11/03/2014 0631   CALCIUM 8.7 11/03/2014 0631   PROT 6.9 11/02/2014 1049   ALBUMIN 3.8 11/02/2014 1049   AST 17 11/02/2014 1049   ALT 9 11/02/2014 1049   ALKPHOS 71 11/02/2014 1049   BILITOT 0.3 11/02/2014 1049   GFRNONAA 90* 11/03/2014 0631   GFRAA >90 11/03/2014 0631   Lab Results  Component Value Date   CHOL 144 11/03/2014   HDL 76 11/03/2014   LDLCALC  57 11/03/2014   TRIG 56 11/03/2014   CHOLHDL 1.9 11/03/2014   Lab Results  Component Value Date   HGBA1C 6.1* 11/02/2014   Lab Results  Component Value Date   VITAMINB12 226 11/02/2014   Lab Results  Component Value Date   TSH 1.180 11/02/2014    ASSESSMENT AND PLAN  GRACELEE STEMMLER is a 64 y.o. female with past medical history of migraine, smoker, hypertension, elevated A1c 6.1, presenting with 2 similar episode, sudden onset visual distortion, nauseous, mild headaches, flashing light in her left visual field, extensive evaluations detailed above,  Her symptoms most consistent with complicated migraines, Continue to address vascular risk factor, stop smoking, moderate exercise, Aspirin 325 mg daily Fioricet as needed Return to clinic in 3 months with Hoyle Sauer She may return to work  Marcial Pacas, M.D. Ph.D.  [facility] [address], [place], [place] [address] [phone]

## 2014-11-21 NOTE — Patient Instructions (Signed)
B12 1039mg once daily, Keep ASA 3267m

## 2014-11-28 ENCOUNTER — Ambulatory Visit
Admission: RE | Admit: 2014-11-28 | Discharge: 2014-11-28 | Disposition: A | Discharge status: Home / Self Care | Payer: BLUE CROSS/BLUE SHIELD | Source: Ambulatory Visit | Attending: Nurse Practitioner | Admitting: Nurse Practitioner

## 2014-11-28 ENCOUNTER — Other Ambulatory Visit: Payer: Self-pay | Admitting: Nurse Practitioner

## 2014-11-28 DIAGNOSIS — M79604 Pain in right leg: Secondary | ICD-10-CM

## 2014-11-28 DIAGNOSIS — Z8673 Personal history of transient ischemic attack (TIA), and cerebral infarction without residual deficits: Secondary | ICD-10-CM

## 2014-11-28 DIAGNOSIS — M79605 Pain in left leg: Secondary | ICD-10-CM

## 2014-11-28 DIAGNOSIS — Z720 Tobacco use: Secondary | ICD-10-CM

## 2014-12-20 ENCOUNTER — Telehealth (HOSPITAL_COMMUNITY): Payer: Self-pay | Admitting: Interventional Radiology

## 2014-12-20 NOTE — Telephone Encounter (Signed)
Called pt, left another VM for her to call to schedule consult. JM

## 2014-12-27 ENCOUNTER — Other Ambulatory Visit (HOSPITAL_COMMUNITY): Payer: Self-pay | Admitting: Interventional Radiology

## 2014-12-27 DIAGNOSIS — G459 Transient cerebral ischemic attack, unspecified: Secondary | ICD-10-CM

## 2015-01-02 ENCOUNTER — Ambulatory Visit (HOSPITAL_COMMUNITY)
Admission: RE | Admit: 2015-01-02 | Discharge: 2015-01-02 | Disposition: A | Discharge status: Home / Self Care | Payer: BLUE CROSS/BLUE SHIELD | Source: Ambulatory Visit | Attending: Interventional Radiology | Admitting: Interventional Radiology

## 2015-01-02 ENCOUNTER — Other Ambulatory Visit (HOSPITAL_COMMUNITY): Payer: Self-pay | Admitting: Interventional Radiology

## 2015-01-02 DIAGNOSIS — G459 Transient cerebral ischemic attack, unspecified: Secondary | ICD-10-CM

## 2015-01-20 ENCOUNTER — Other Ambulatory Visit: Payer: Self-pay | Admitting: Radiology

## 2015-01-21 ENCOUNTER — Other Ambulatory Visit (HOSPITAL_COMMUNITY): Payer: Self-pay | Admitting: Interventional Radiology

## 2015-01-21 ENCOUNTER — Ambulatory Visit (HOSPITAL_COMMUNITY)
Admission: RE | Admit: 2015-01-21 | Discharge: 2015-01-21 | Disposition: A | Discharge status: Home / Self Care | Payer: BLUE CROSS/BLUE SHIELD | Source: Ambulatory Visit | Attending: Interventional Radiology | Admitting: Interventional Radiology

## 2015-01-21 DIAGNOSIS — Z90710 Acquired absence of both cervix and uterus: Secondary | ICD-10-CM | POA: Insufficient documentation

## 2015-01-21 DIAGNOSIS — Z7982 Long term (current) use of aspirin: Secondary | ICD-10-CM | POA: Insufficient documentation

## 2015-01-21 DIAGNOSIS — M797 Fibromyalgia: Secondary | ICD-10-CM | POA: Insufficient documentation

## 2015-01-21 DIAGNOSIS — F329 Major depressive disorder, single episode, unspecified: Secondary | ICD-10-CM | POA: Insufficient documentation

## 2015-01-21 DIAGNOSIS — I6501 Occlusion and stenosis of right vertebral artery: Secondary | ICD-10-CM | POA: Diagnosis not present

## 2015-01-21 DIAGNOSIS — F17210 Nicotine dependence, cigarettes, uncomplicated: Secondary | ICD-10-CM | POA: Diagnosis not present

## 2015-01-21 DIAGNOSIS — F410 Panic disorder [episodic paroxysmal anxiety] without agoraphobia: Secondary | ICD-10-CM | POA: Insufficient documentation

## 2015-01-21 DIAGNOSIS — K5090 Crohn's disease, unspecified, without complications: Secondary | ICD-10-CM | POA: Insufficient documentation

## 2015-01-21 DIAGNOSIS — Q278 Other specified congenital malformations of peripheral vascular system: Secondary | ICD-10-CM | POA: Diagnosis not present

## 2015-01-21 DIAGNOSIS — G459 Transient cerebral ischemic attack, unspecified: Secondary | ICD-10-CM

## 2015-01-21 DIAGNOSIS — Z8673 Personal history of transient ischemic attack (TIA), and cerebral infarction without residual deficits: Secondary | ICD-10-CM | POA: Diagnosis not present

## 2015-01-21 DIAGNOSIS — I671 Cerebral aneurysm, nonruptured: Secondary | ICD-10-CM | POA: Insufficient documentation

## 2015-01-21 DIAGNOSIS — K219 Gastro-esophageal reflux disease without esophagitis: Secondary | ICD-10-CM | POA: Insufficient documentation

## 2015-01-21 DIAGNOSIS — G450 Vertebro-basilar artery syndrome: Secondary | ICD-10-CM | POA: Diagnosis present

## 2015-01-21 DIAGNOSIS — I10 Essential (primary) hypertension: Secondary | ICD-10-CM | POA: Insufficient documentation

## 2015-01-21 LAB — CBC WITH DIFFERENTIAL/PLATELET
BASOS ABS: 0 10*3/uL (ref 0.0–0.1)
Basophils Relative: 1 % (ref 0–1)
EOS PCT: 3 % (ref 0–5)
Eosinophils Absolute: 0.2 10*3/uL (ref 0.0–0.7)
HCT: 39.7 % (ref 36.0–46.0)
HEMOGLOBIN: 12.6 g/dL (ref 12.0–15.0)
Lymphocytes Relative: 26 % (ref 12–46)
Lymphs Abs: 1.7 10*3/uL (ref 0.7–4.0)
MCH: 29.4 pg (ref 26.0–34.0)
MCHC: 31.7 g/dL (ref 30.0–36.0)
MCV: 92.5 fL (ref 78.0–100.0)
MONO ABS: 0.4 10*3/uL (ref 0.1–1.0)
Monocytes Relative: 6 % (ref 3–12)
Neutro Abs: 4.1 10*3/uL (ref 1.7–7.7)
Neutrophils Relative %: 64 % (ref 43–77)
Platelets: 235 10*3/uL (ref 150–400)
RBC: 4.29 MIL/uL (ref 3.87–5.11)
RDW: 13.9 % (ref 11.5–15.5)
WBC: 6.4 10*3/uL (ref 4.0–10.5)

## 2015-01-21 LAB — BASIC METABOLIC PANEL
Anion gap: 6 (ref 5–15)
BUN: 12 mg/dL (ref 6–23)
CO2: 29 mmol/L (ref 19–32)
Calcium: 9.4 mg/dL (ref 8.4–10.5)
Chloride: 105 mmol/L (ref 96–112)
Creatinine, Ser: 0.85 mg/dL (ref 0.50–1.10)
GFR calc Af Amer: 83 mL/min — ABNORMAL LOW (ref >90)
GFR calc non Af Amer: 71 mL/min — ABNORMAL LOW (ref >90)
GLUCOSE: 99 mg/dL (ref 70–99)
Potassium: 4.3 mmol/L (ref 3.5–5.1)
SODIUM: 140 mmol/L (ref 135–145)

## 2015-01-21 LAB — APTT: aPTT: 32 seconds (ref 24–37)

## 2015-01-21 LAB — PROTIME-INR
INR: 0.99 (ref 0.00–1.49)
Prothrombin Time: 13.2 seconds (ref 11.6–15.2)

## 2015-01-21 MED ORDER — MIDAZOLAM HCL 2 MG/2ML IJ SOLN
INTRAMUSCULAR | Status: AC | PRN
  Administered 2015-01-21 (×2): 0.5 mg via INTRAVENOUS

## 2015-01-21 MED ORDER — FENTANYL CITRATE 0.05 MG/ML IJ SOLN
INTRAMUSCULAR | Status: AC
  Filled 2015-01-21: qty 4

## 2015-01-21 MED ORDER — IOHEXOL 300 MG/ML  SOLN
150.0000 mL | Freq: Once | INTRAMUSCULAR | Status: AC | PRN
  Administered 2015-01-21: 80 mL via INTRA_ARTERIAL

## 2015-01-21 MED ORDER — MIDAZOLAM HCL 2 MG/2ML IJ SOLN
INTRAMUSCULAR | Status: AC
Start: 2015-01-21 — End: 2015-01-21
  Filled 2015-01-21: qty 4

## 2015-01-21 MED ORDER — LIDOCAINE HCL 1 % IJ SOLN
INTRAMUSCULAR | Status: AC
  Filled 2015-01-21: qty 20

## 2015-01-21 MED ORDER — SODIUM CHLORIDE 0.9 % IV SOLN: Freq: Once | INTRAVENOUS | Status: DC

## 2015-01-21 MED ORDER — FENTANYL CITRATE 0.05 MG/ML IJ SOLN
INTRAMUSCULAR | Status: AC | PRN
  Administered 2015-01-21: 25 ug via INTRAVENOUS

## 2015-01-21 MED ORDER — HEPARIN SODIUM (PORCINE) 1000 UNIT/ML IJ SOLN
INTRAMUSCULAR | Status: AC | PRN
  Administered 2015-01-21: 1000 [IU] via INTRAVENOUS

## 2015-01-21 MED ORDER — HEPARIN SOD (PORK) LOCK FLUSH 100 UNIT/ML IV SOLN
INTRAVENOUS | Status: AC
Start: 2015-01-21 — End: 2015-01-21
  Filled 2015-01-21: qty 20

## 2015-01-21 MED ORDER — SODIUM CHLORIDE 0.9 % IV SOLN
INTRAVENOUS | Status: AC
Start: 2015-01-21 — End: 2015-01-21

## 2015-01-21 MED ORDER — SODIUM CHLORIDE 0.9 % IV SOLN
INTRAVENOUS | Status: DC
  Administered 2015-01-21: 1000 mL via INTRAVENOUS

## 2015-01-21 NOTE — Discharge Instructions (Signed)
Angiogram, Care After  Refer to this sheet in the next few weeks. These instructions provide you with information on caring for yourself after your procedure. Your health care provider may also give you more specific instructions. Your treatment has been planned according to current medical practices, but problems sometimes occur. Call your health care provider if you have any problems or questions after your procedure.  WHAT TO EXPECT AFTER THE PROCEDURE After your procedure, it is typical to have the following sensations:  Minor discomfort or tenderness and a small bump at the catheter insertion site. The bump should usually decrease in size and tenderness within 1 to 2 weeks.  Any bruising will usually fade within 2 to 4 weeks. HOME CARE INSTRUCTIONS   You may need to keep taking blood thinners if they were prescribed for you. Take medicines only as directed by your health care provider.  Do not apply powder or lotion to the site.  Do not take baths, swim, or use a hot tub until your health care provider approves.  You may shower 24 hours after the procedure. Remove the bandage (dressing) and gently wash the site with plain soap and water. Gently pat the site dry.  Inspect the site at least twice daily.  Limit your activity for the first 48 hours. Do not bend, squat, or lift anything over 10 lb (9 kg) or as directed by your health care provider.  Plan to have someone take you home after the procedure. Follow instructions about when you can drive or return to work.  You can return to work on Friday 01/24/15, but do not lift anything over 10 lbs until Monday 01/27/15. SEEK MEDICAL CARE IF:  You get light-headed when standing up.  You have drainage (other than a small amount of blood on the dressing).  You have chills.  You have a fever.  You have redness, warmth, swelling, or pain at the insertion site. SEEK IMMEDIATE MEDICAL CARE IF:   You develop chest pain or shortness of  breath, feel faint, or pass out.  You have bleeding, swelling larger than a walnut, or drainage from the catheter insertion site.  You develop pain, discoloration, coldness, or severe bruising in the leg or arm that held the catheter.  You have heavy bleeding from the site. If this happens, hold pressure on the site. MAKE SURE YOU:  Understand these instructions.  Will watch your condition.  Will get help right away if you are not doing well or get worse. Document Released: 05/20/2005 Document Revised: 03/18/2014 Document Reviewed: 03/26/2013 [facility] Patient Information 2015 [place], [place]. This information is not intended to replace advice given to you by your health care provider. Make sure you discuss any questions you have with your health care provider.

## 2015-01-21 NOTE — H&P (Signed)
Chief Complaint: Dizziness, blurred vision, gait instability  Referring Physician(s):  T Anderson,NP  History of Present Illness: Stacy Hansen is a 64 y.o. female smoker with history of HTN, HA's, prior TIA's x2,   intermittent dizzy spells, gait instability, left > right visual blurring, left tinnitus, occ confusion/dysarthria and CTA head/neck 10/2014 revealing no major circle of Willis branch occlusion or proximal stenosis,but right >left diminutive  MCA anterior division vessels . No changes of anterior left MCA ischemia .Multiple anatomic variations including a persistent right trigeminal artery, a shared common carotid artery origin off of the aortic arch, and an aberrant origin of the right subclavian artery. She presents today for diagnostic cererbral arteriogram to rule out significant vascular disease, particularly in  vertebrobasilar distribution.   Past Medical History  Diagnosis Date  . Atypical chest pain   . Tobacco abuse   . History of fibromyalgia   . Crohn disease   . Depression   . Anxiety   . Brain aneurysm   . Hyperplastic colon polyp   . GERD (gastroesophageal reflux disease)   . Panic attacks   . Stroke   . Inflammatory bowel disease   . Chronic abdominal pain     Past Surgical History  Procedure Laterality Date  . Orif ulnar fracture      left   . Rotator cuff repair      left  . Vaginal hysterectomy      has her ovaries  . Tubal ligation    . Tonsillectomy    . Cataract extraction      bilateral  . Vein removed      right varicose vein    Allergies: Ciprofloxacin; Meperidine hcl; Opium; Promethazine hcl; and Propoxyphene n-acetaminophen  Medications: Prior to Admission medications   Medication Sig Start Date End Date Taking? Authorizing Provider  albuterol (PROVENTIL HFA;VENTOLIN HFA) 108 (90 BASE) MCG/ACT inhaler Inhale 2 puffs into the lungs every 6 (six) hours as needed for wheezing or shortness of breath.   Yes Historical Provider,  MD  aspirin 325 MG tablet Take 1 tablet (325 mg total) by mouth daily. 11/03/14  Yes Samuella Cota, MD  butalbital-acetaminophen-caffeine (FIORICET, ESGIC) [phone] MG per tablet Take 1 tablet by mouth every 6 (six) hours as needed for headache. 11/21/14  Yes Marcial Pacas, MD  carboxymethylcellulose (REFRESH PLUS) 0.5 % SOLN Apply 1 drop to eye 3 (three) times daily as needed (dry/itching eyes).    Yes Historical Provider, MD  LORazepam (ATIVAN) 0.5 MG tablet Take 0.5 mg by mouth at bedtime.  05/06/12  Yes Reyne Dumas, MD  Phenyleph-Doxylamine-DM-APAP (ALKA-SELTZER PLS ALLERGY & CGH PO) Take 1 packet by mouth daily as needed (for cold and cough symptoms).    Yes Historical Provider, MD  vitamin B-12 (CYANOCOBALAMIN) 1000 MCG tablet Take 1,000 mcg by mouth daily.   Yes Historical Provider, MD  atorvastatin (LIPITOR) 20 MG tablet Take 1 tablet (20 mg total) by mouth daily at 6 PM. Patient not taking: Reported on 01/20/2015 11/03/14   Samuella Cota, MD  traMADol (ULTRAM) 50 MG tablet Take 1 tablet (50 mg total) by mouth every 6 (six) hours as needed. Patient not taking: Reported on 01/20/2015 02/20/14   Milton Ferguson, MD    Family History  Problem Relation Age of Onset  . Diabetes Mother   . Kidney failure Mother   . Diabetes Brother   . Stroke Father   . Diabetes Sister     x 2  .  Colon cancer Brother     in his 22's    History   Social History  . Marital Status: Married    Spouse Name: N/A  . Number of Children: 2  . 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 Smoking status: Current Every Day Smoker -- 0.25 packs/day for 3 years    Types: Cigarettes  . Smokeless tobacco: Never Used     Comment: one pack/ 3 days, quit for a year, Tobacco info given 03/07/13  . Alcohol Use: No  . Drug Use: No  . Sexual Activity: Yes    Birth Control/ Protection: None   Other Topics Concern  . Not on file   Social History  Narrative      Review of Systems: A 12 point ROS discussed and pertinent positives are indicated in the HPI above.  All other systems are negative.  Review of Systems   see above  Vital Signs: BP 116/61 mmHg  Pulse 65  Temp(Src) 97.4 F (36.3 C) (Oral)  Resp 18  Ht 5' 3"  (1.6 m)  Wt 114 lb (51.71 kg)  BMI 20.20 kg/m2  SpO2 100%  Physical Exam  Constitutional: She is oriented to person, place, and time. She appears well-developed and well-nourished.  Cardiovascular: Normal rate.   occ ectopy noted  Pulmonary/Chest: Effort normal.  Distant BS bilat  Abdominal: Soft. Bowel sounds are normal. There is no tenderness.  Musculoskeletal: Normal range of motion. She exhibits no edema.  Neurological: She is alert and oriented to person, place, and time. No cranial nerve deficit. Coordination normal.     Imaging: Ir Radiologist Eval & Mgmt  01/03/2015   EXAM: ESTABLISHED PATIENT OFFICE VISIT  CHIEF COMPLAINT: Symptoms of dizziness, gait instability, and bilateral visual blurring left greater than right.  Current Pain Level: 1-10  HISTORY OF PRESENT ILLNESS: The patient is a 64 year old right-handed lady who presents for evaluation of symptoms of ringing in the left ear, dizziness, episodes of clamminess, sweating and gazed sensations for the past few weeks.  The patient is accompanied by her daughter.  According to them, the patient over the past 3-6 months has had two episodes whereby she experiences the onset of fogginess in the left eye primarily and to a less degree the right eye. These then proceed to a generalized blurring again worse in the left eye than in the right eye whereby she is unable to read. However, she is able to discern light of day.  This is then associated with symptoms of dizziness, clamminess and sweating. These apparently last for two hours and are improved by the patient lying down.  During these spells the daughter has noted that her speech is dysarthric and she is  confused intermittently.  This is also associated with difficulty with breathing at that time.  Following these episode the patient appears rather tired. She denies any diplopia or complete blindness associated with this.  There is no preceding or post event headache associated with this. There is no nausea or vomiting.  The patient's symptoms have occurred while staying on aspirin.  Otherwise, her review of systems is essentially negative for pathologic symptomatology.  Past Medical History: Cancer of the skin. Depression. Stroke. Crohn's disease for which she has been treated with dietary control.  Past Surgical History: Hysterectomy. Arm and shoulder surgery. Skin cancer removed. Bilateral cataract surgery.  Medications: Aspirin 325 mg once a day. Vitamin B  12 1200 mg once a day. Ativan 0.5 mg on a p.r.n. basis.  Allergies: Demerol. Phenergan. Darvocet. These cause the patient's blood pressure to drop and also with depressed level of consciousness.  Social History: Patient is married. Has one daughter and one son both of whom are healthy. Has four brothers and four sisters also alive and well.  She smokes one pack every four days. Has smoked for many many years. She denies drinking any alcohol. Denies any use of illicit chemicals.  Family History: Significant for colon cancer and eye cancer. Diabetes in her mother. Stroke in her father. Her sister has migraine headaches.  PHYSICAL EXAMINATION: On brief clinical examination the patient is in no acute distress. Affect is normal. The patient neurologically is alert, awake, oriented to time, place, space. Cranial nerves are grossly unremarkable. Motor, sensory, station and gait intact.  ASSESSMENT AND PLAN: The patient's recent CT angiogram images were reviewed as was previous arteriogram performed in approximately 2007.  The CT angiogram demonstrates significant basilar attenuation with a right-sided persistent trigeminal artery. The origins of the vertebral  arteries are poorly defined on the CT angiogram.  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 These events statistically increase the chance of having a permanent neurological injury in the form of a stroke in the vertebrobasilar territory.  With the patient having symptoms despite being on aspirin, it was felt the patient probably needs a formal catheter angiogram to definitively and more actually define the posterior circulation and collaterals.  The patient and her daughter are in agreement to proceed with a formal catheter angiogram. The procedure, the reasons, the benefits, the alternatives and the risks were all discussed in detail.  Questions were answered to their satisfaction.  In the meantime the patient has been asked to continue taking her aspirin 325 mg a day, and was strongly advised to stop smoking immediately as this was a significant risk factor toward worsening extracranial and intracranial arteriosclerotic disease.  The patient and her daughter leave with good understanding and agreement with the above management plan. A formal catheter angiogram will be performed as quickly as possible. Further recommendations to follow the findings on the catheter angiogram.   Electronically Signed   By: Luanne Bras M.D.   On: 01/02/2015 16:41    Labs:  CBC:  Recent Labs  02/19/14 1847 11/02/14 1049 11/03/14 0631  WBC 6.8 9.4 8.8  HGB 12.9 13.0 12.8  HCT 39.5 40.7 39.7  PLT 271 299 309    COAGS:  Recent Labs  11/02/14 1049  INR 1.00  APTT 31    BMP:  Recent Labs  02/19/14 1847 11/02/14 1049 11/03/14 0631  NA 144 139 141  K 4.3 4.1 4.3  CL 104 101 105  CO2 30 26 24   GLUCOSE 103* 100* 110*  BUN 6 9 12   CALCIUM 9.8 9.2 8.7  CREATININE 0.82 0.74 0.71  GFRNONAA 75* 89* 90*  GFRAA 87* >90 >90    LIVER FUNCTION TESTS:  Recent Labs  02/19/14 1847 11/02/14 1049  BILITOT 0.3 0.3  AST 22 17  ALT  10 9  ALKPHOS 64 71  PROT 7.5 6.9  ALBUMIN 4.2 3.8    TUMOR MARKERS: No results for input(s): AFPTM, CEA, CA199, CHROMGRNA in the last 8760 hours.  Assessment and Plan: Stacy Hansen is a 64 y.o. female smoker with history of HTN, HA's, prior TIA's x2,   intermittent dizzy spells, gait instability,  left > right visual blurring, left tinnitus, occ confusion/dysarthria and CTA head/neck 10/2014 revealing no major circle of Willis branch occlusion or proximal stenosis,but right >left diminutive  MCA anterior division vessels . No changes of anterior left MCA ischemia .Multiple anatomic variations including a persistent right trigeminal artery, a shared common carotid artery origin off of the aortic arch, and an aberrant origin of the right subclavian artery. She presents today for diagnostic cererbral arteriogram to rule out significant vascular disease, particularly in  vertebrobasilar distribution. Details/risks of procedure d/w pt/family , including but not limited to , internal bleeding, infection, stroke with their understanding and consent.     Signed: Autumn Messing 01/21/2015, 9:09 AM   I spent a total of 20 minutes face to face in clinical consultation, greater than 50% of which was counseling/coordinating care for cerebral arteriogram.

## 2015-01-21 NOTE — Procedures (Signed)
S/P 4 vessel cerebral arteriogram. RT CFA approach. 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

## 2015-01-21 NOTE — Progress Notes (Signed)
Patient's BP was low 79/33 at 1324, reported to Rowe Robert, PA.  Ordered to get a 250cc bolus at 1400.  BP at 1424 was 103/52.  Patient stated that she would feel better if she ate, so she ate a [place] sandwich and stated that she felt better.  BP was 109/62 at 1434.   Rowe Robert, PA stated that she was fine to go home if BP was >92 SBP and patient felt fine.  Patient discharged at 74.

## 2015-01-22 ENCOUNTER — Other Ambulatory Visit (HOSPITAL_COMMUNITY): Payer: Self-pay | Admitting: Interventional Radiology

## 2015-01-22 DIAGNOSIS — I729 Aneurysm of unspecified site: Secondary | ICD-10-CM

## 2015-02-06 ENCOUNTER — Ambulatory Visit (HOSPITAL_COMMUNITY)
Admission: RE | Admit: 2015-02-06 | Discharge: 2015-02-06 | Disposition: A | Discharge status: Home / Self Care | Payer: BLUE CROSS/BLUE SHIELD | Source: Ambulatory Visit | Attending: Interventional Radiology | Admitting: Interventional Radiology

## 2015-02-06 DIAGNOSIS — I729 Aneurysm of unspecified site: Secondary | ICD-10-CM

## 2015-03-05 ENCOUNTER — Ambulatory Visit: Payer: BLUE CROSS/BLUE SHIELD | Admitting: Nurse Practitioner

## 2015-03-06 ENCOUNTER — Encounter: Payer: Self-pay | Admitting: Nurse Practitioner

## 2015-07-10 ENCOUNTER — Encounter: Payer: Self-pay | Admitting: Physician Assistant

## 2015-07-10 ENCOUNTER — Ambulatory Visit (INDEPENDENT_AMBULATORY_CARE_PROVIDER_SITE_OTHER): Payer: BLUE CROSS/BLUE SHIELD | Admitting: Physician Assistant

## 2015-07-10 ENCOUNTER — Other Ambulatory Visit (INDEPENDENT_AMBULATORY_CARE_PROVIDER_SITE_OTHER): Payer: BLUE CROSS/BLUE SHIELD

## 2015-07-10 DIAGNOSIS — K529 Noninfective gastroenteritis and colitis, unspecified: Secondary | ICD-10-CM

## 2015-07-10 DIAGNOSIS — K6389 Other specified diseases of intestine: Secondary | ICD-10-CM | POA: Diagnosis not present

## 2015-07-10 DIAGNOSIS — Z8719 Personal history of other diseases of the digestive system: Secondary | ICD-10-CM | POA: Diagnosis not present

## 2015-07-10 DIAGNOSIS — R109 Unspecified abdominal pain: Secondary | ICD-10-CM

## 2015-07-10 LAB — CBC WITH DIFFERENTIAL/PLATELET
Basophils Absolute: 0 10*3/uL (ref 0.0–0.1)
Basophils Relative: 0.3 % (ref 0.0–3.0)
EOS ABS: 0.1 10*3/uL (ref 0.0–0.7)
Eosinophils Relative: 0.9 % (ref 0.0–5.0)
HCT: 39.1 % (ref 36.0–46.0)
Hemoglobin: 12.9 g/dL (ref 12.0–15.0)
LYMPHS PCT: 22.4 % (ref 12.0–46.0)
Lymphs Abs: 2 10*3/uL (ref 0.7–4.0)
MCHC: 33 g/dL (ref 30.0–36.0)
MCV: 89.1 fl (ref 78.0–100.0)
MONO ABS: 0.6 10*3/uL (ref 0.1–1.0)
Monocytes Relative: 6 % (ref 3.0–12.0)
Neutro Abs: 6.4 10*3/uL (ref 1.4–7.7)
Neutrophils Relative %: 70.4 % (ref 43.0–77.0)
Platelets: 310 10*3/uL (ref 150.0–400.0)
RBC: 4.39 Mil/uL (ref 3.87–5.11)
RDW: 14.1 % (ref 11.5–15.5)
WBC: 9.1 10*3/uL (ref 4.0–10.5)

## 2015-07-10 LAB — HIGH SENSITIVITY CRP: CRP HIGH SENSITIVITY: 3.3 mg/L (ref 0.000–5.000)

## 2015-07-10 LAB — COMPREHENSIVE METABOLIC PANEL
ALK PHOS: 70 U/L (ref 39–117)
ALT: 14 U/L (ref 0–35)
AST: 19 U/L (ref 0–37)
Albumin: 4.6 g/dL (ref 3.5–5.2)
BILIRUBIN TOTAL: 0.5 mg/dL (ref 0.2–1.2)
BUN: 13 mg/dL (ref 6–23)
CALCIUM: 10.3 mg/dL (ref 8.4–10.5)
CHLORIDE: 104 meq/L (ref 96–112)
CO2: 31 mEq/L (ref 19–32)
Creatinine, Ser: 0.84 mg/dL (ref 0.40–1.20)
GFR: 72.58 mL/min (ref >60.00)
Glucose, Bld: 106 mg/dL — ABNORMAL HIGH (ref 70–99)
Potassium: 5.1 mEq/L (ref 3.5–5.1)
SODIUM: 143 meq/L (ref 135–145)
TOTAL PROTEIN: 7.7 g/dL (ref 6.0–8.3)

## 2015-07-10 LAB — SEDIMENTATION RATE: SED RATE: 19 mm/h (ref 0–22)

## 2015-07-10 NOTE — Progress Notes (Signed)
Reviewed and agree.

## 2015-07-10 NOTE — Patient Instructions (Signed)
Please go to the basement level to have your labs drawn.    You have been scheduled for a CT scan of the abdomen and pelvis at [facility] ([address]---this is in the same building as Press photographer).   You are scheduled on  07-16-2015 at 8:30 am . You should arrive at 8:15 minutes prior to your appointment time for registration. Please follow the written instructions below on the day of your exam:  WARNING: IF YOU ARE ALLERGIC TO IODINE/X-RAY DYE, PLEASE NOTIFY RADIOLOGY IMMEDIATELY AT [phone]! YOU WILL BE GIVEN A 13 HOUR PREMEDICATION PREP.  1) Do not eat or drink anything after  4:30 am  (4 hours prior to your test) 2) You have been given 2 bottles of oral contrast to drink. The solution may taste better if refrigerated, but do NOT add ice or any other liquid to this solution. Shake well before drinking.    Drink 1 bottle of contrast @  6:30 am (2 hours prior to your exam)  Drink 1 bottle of contrast @ 7:30 am (1 hour prior to your exam)  You may take any medications as prescribed with a small amount of water except for the following: Metformin, Glucophage, Glucovance, Avandamet, Riomet, Fortamet, Actoplus Met, Janumet, Glumetza or Metaglip. The above medications must be held the day of the exam AND 48 hours after the exam.  The purpose of you drinking the oral contrast is to aid in the visualization of your intestinal tract. The contrast solution may cause some diarrhea. Before your exam is started, you will be given a small amount of fluid to drink. Depending on your individual set of symptoms, you may also receive an intravenous injection of x-ray contrast/dye. Plan on being at [facility] for 30 minutes or long, depending on the type of exam you are having performed.  If you have any questions regarding your exam or if you need to reschedule, you may call the CT department at [phone] between the hours of 8:00 am and 5:00 pm,  Monday-Friday.  ________________________________________________________________________

## 2015-07-10 NOTE — Progress Notes (Signed)
Patient ID: Stacy Hansen, female   DOB: 1951/08/28, 64 y.o.   MRN: [id number]   Subjective:    Patient ID: Stacy Hansen, female    DOB: [birth year], 64 y.o.   MRN: [id number]  HPI Stacy Hansen is a 64 year old white female known to Dr. Delfin Edis who was last seen here in January 2015. She has history of inflammatory bowel disease which apparently was initially diagnosed in 1997. She had been seen by several gastroenterologists over the years. Patient has been told that she had Crohn's disease. Her last colonoscopy had been done by a Dr. Britta Hansen in the [facility] in August 2013 which showed multiple small sessile polyps which were removed and path consistent with hyperplastic polyps there was no evidence of inflammation in the colon, no biopsies were taken to assess for underlying colitis. Patient has not been on any medication over the past year and a half or so. Reviewing her records she actually had IBD markers done in 2014 which were negative. She comes in today with complaints of 4-5 week history of persistent right-sided lower abdominal pain. She says she is not having pain constantly but has been having pain off and on over the past month and feels tender and sore on the right side. She feels that she is probably having A Crohn's flare. She is also been having recent problems with intermittent sores in her mouth. She says these are whitish appearing and painful. Her bowel movements have been fairly regular for her and she has not noted any melena or hematochezia. No fever or chills. Appetite has been okay, weight stable, no nausea or vomiting.  Review of Systems Pertinent positive and negative review of systems were noted in the above HPI section.  All other review of systems was otherwise negative.  Outpatient Encounter Prescriptions as of 07/10/2015  Medication Sig  . albuterol (PROVENTIL HFA;VENTOLIN HFA) 108 (90 BASE) MCG/ACT inhaler Inhale 2 puffs into the lungs every 6 (six) hours as  needed for wheezing or shortness of breath.  Marland Kitchen aspirin 325 MG tablet Take 1 tablet (325 mg total) by mouth daily.  Marland Kitchen atorvastatin (LIPITOR) 20 MG tablet Take 1 tablet (20 mg total) by mouth daily at 6 PM.  . butalbital-acetaminophen-caffeine (FIORICET, ESGIC) 50-325-40 MG per tablet Take 1 tablet by mouth every 6 (six) hours as needed for headache.  . carboxymethylcellulose (REFRESH PLUS) 0.5 % SOLN Apply 1 drop to eye 3 (three) times daily as needed (dry/itching eyes).   . LORazepam (ATIVAN) 0.5 MG tablet Take 0.5 mg by mouth at bedtime.   Marland Kitchen Phenyleph-Doxylamine-DM-APAP (ALKA-SELTZER PLS ALLERGY & [facility] PO) Take 1 packet by mouth daily as needed (for cold and cough symptoms).   . traMADol (ULTRAM) 50 MG tablet Take 1 tablet (50 mg total) by mouth every 6 (six) hours as needed.  . vitamin B-12 (CYANOCOBALAMIN) 1000 MCG tablet Take 1,000 mcg by mouth daily.   No facility-administered encounter medications on file as of 07/10/2015.   Allergies  Allergen Reactions  . Ciprofloxacin Other (See Comments)    Unknown reaction per pt  . Meperidine Hcl Nausea And Vomiting and Other (See Comments)    Causes blood pressure to drop when taken with Phenergan through IV  . Opium Other (See Comments)    Causes blood pressure to drop when taken with Phenergan through IV  . Promethazine Hcl Nausea And Vomiting and Other (See Comments)    Causes blood pressure to drop when taken with Demeral via IV  .  Propoxyphene N-Acetaminophen Other (See Comments)    "pass out"   Patient Active Problem List   Diagnosis Date Noted  . TIA (transient ischemic attack)   . Left sided numbness 11/02/2014  . Tobacco use 11/02/2014  . Bradycardia 11/02/2014  . Brain aneurysm 11/02/2014  . Crohn disease 11/02/2014  . Fibromyalgia 11/02/2014  . Left-sided weakness 11/02/2014  . Chronic anxiety 11/02/2014  . CVA (cerebral infarction) 07/10/2013  . Dizziness 05/05/2012  . Anxiety state, unspecified 12/11/2009  . TOBACCO  ABUSE 12/11/2009  . Subjective visual disturbance 12/11/2009  . Myalgia and myositis 12/11/2009  . PALPITATIONS 12/11/2009  . Precordial pain 12/11/2009   Social History   Social History  . Marital Status: Married    Spouse Name: N/A  . Number of Children: 2  . 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 Smoking status: Current Every Day Smoker -- 0.25 packs/day for 3 years    Types: Cigarettes  . Smokeless tobacco: Never Used     Comment: one pack/ 3 days, quit for a year, Tobacco info given 03/07/13  . Alcohol Use: No  . Drug Use: No  . Sexual Activity: Yes    Birth Control/ Protection: None   Other Topics Concern  . Not on file   Social History Narrative    Stacy Hansen's family history includes Colon cancer in her brother; Diabetes in her brother, mother, and sister; Kidney failure in her mother; Stroke in her father.      Objective:    Filed Vitals:   07/10/15 1036  BP: 104/84  Pulse: 64    Physical Exam  well-developed older white female in no acute distress, pleasant blood pressure 104/84 pulse 64 height 5 foot 3 weight 125. HEENT :nontraumatic normocephalic EOMI PERRLA sclera anicteric, Supple: no JVD, Cardiovascular: regular rate and rhythm with S1-S2 no murmur or gallop, Pulmonary :clear bilaterally, Abdomen :soft she is tender in the epigastrium and right upper quadrant right mid quadrant and right lower quadrant there is no guarding or rebound she has some fullness in the right lower quadrant but no definite mass, bowel sounds are present, Rectal: exam not done, Ext; no clubbing cyanosis or edema skin warm and dry, Neuropsych: mood and affect appropriate       Assessment & Plan:   #1 64 yo female with hx of IBD/?Crohns with quiescent disease over the past few years now with 5 week hx of right sided abdominal pain . Will R/O reactivation of IBD, vs other intraabdominal  inflammatory process #2  chronic anxiety #3 hx of  Complicated migraines  Plan; Baseline Labs,ESR,CRP Schedule for CT abdomen/pelvis with contrast .  Discussed establishing with new GI MD with Dr. Nichola S[facility], and she would like to establish with Dr. Silverio Decamp. If Ct is not definitive, she will need Colonoscopy, and will schedule with Dr Ancil Linsey PA-C 07/10/2015   Cc: Vicenta Aly, [place]

## 2015-07-16 ENCOUNTER — Ambulatory Visit (INDEPENDENT_AMBULATORY_CARE_PROVIDER_SITE_OTHER)
Admission: RE | Admit: 2015-07-16 | Discharge: 2015-07-16 | Disposition: A | Discharge status: Home / Self Care | Payer: BLUE CROSS/BLUE SHIELD | Source: Ambulatory Visit | Attending: Physician Assistant | Admitting: Physician Assistant

## 2015-07-16 DIAGNOSIS — R109 Unspecified abdominal pain: Secondary | ICD-10-CM

## 2015-07-16 MED ORDER — IOHEXOL 300 MG/ML  SOLN
100.0000 mL | Freq: Once | INTRAMUSCULAR | Status: AC | PRN
  Administered 2015-07-16: 100 mL via INTRAVENOUS

## 2015-07-24 ENCOUNTER — Telehealth: Payer: Self-pay

## 2015-07-24 NOTE — Telephone Encounter (Signed)
-----   Message from Alfredia Ferguson, PA-C sent at 07/22/2015 11:09 AM EDT ----- What makes you think she doesn't meet [facility] guidelines-I reviewed, and dont see anything-let me know.  Lets call her and let her know we would like to move forward and would like to establish her with Dr . Havery Moros if she is agreeable- can schedule her an office visit first with hm if she would like.

## 2015-07-29 NOTE — Telephone Encounter (Signed)
Patient contacted. Per her request the pre visit is 10/27 and the colonoscopy is 09/18/15 Patient calls today sounding irate that her appointment is in November. She states she has such a burning in her stomach and none of the OTC's are helping. She has scheduled an appointment with her PCP for evaluation and requests her OV notes and labs be faxed.

## 2015-07-29 NOTE — Telephone Encounter (Signed)
Patient states that she is returning Beth's call. She wants to make sure we faxed the lab results and imaging results to Dr. Vicenta Aly. Patient states that she is still having problems with her stomach. Best # [phone]

## 2015-07-29 NOTE — Telephone Encounter (Signed)
I will be happy to rx an antispasmotic to see if will help with burning pain- can try bentyl 10 mg po tid #90/1 refill until she gets procedures done- no sign of active Crohns by labs or CT so do not want to start anything for Crohns .make sure she get put on cancellation list  For procedures

## 2015-07-31 NOTE — Telephone Encounter (Signed)
I have left message for the patient to call back

## 2015-08-04 NOTE — Telephone Encounter (Signed)
I have left message for the patient to call back

## 2015-08-13 ENCOUNTER — Other Ambulatory Visit (HOSPITAL_COMMUNITY): Payer: Self-pay | Admitting: Interventional Radiology

## 2015-08-13 DIAGNOSIS — I729 Aneurysm of unspecified site: Secondary | ICD-10-CM

## 2015-08-15 ENCOUNTER — Other Ambulatory Visit: Payer: Self-pay

## 2015-08-15 DIAGNOSIS — Z1231 Encounter for screening mammogram for malignant neoplasm of breast: Secondary | ICD-10-CM

## 2015-08-15 NOTE — Telephone Encounter (Signed)
Patient is on wait list. I have left message for the patient to call back.

## 2015-08-19 NOTE — Telephone Encounter (Signed)
Spoke with the patient. She declines offers of an earlier appointment with Dr Havery Moros. She has other appointments and has already made her arrangements for the date she has now. She has put herself on OTC Prevacid. States this has helped. She just doesn't understand why we could realize the problem is coming from her intestines. Per her statement, she does not want to move her appointment.

## 2015-08-28 ENCOUNTER — Ambulatory Visit (HOSPITAL_COMMUNITY)
Admission: RE | Admit: 2015-08-28 | Discharge: 2015-08-28 | Disposition: A | Discharge status: Home / Self Care | Payer: BLUE CROSS/BLUE SHIELD | Source: Ambulatory Visit | Attending: Interventional Radiology | Admitting: Interventional Radiology

## 2015-08-28 ENCOUNTER — Ambulatory Visit (HOSPITAL_COMMUNITY): Admission: RE | Admit: 2015-08-28 | Payer: BLUE CROSS/BLUE SHIELD | Source: Ambulatory Visit

## 2015-08-28 DIAGNOSIS — Q278 Other specified congenital malformations of peripheral vascular system: Secondary | ICD-10-CM | POA: Insufficient documentation

## 2015-08-28 DIAGNOSIS — I729 Aneurysm of unspecified site: Secondary | ICD-10-CM

## 2015-08-28 DIAGNOSIS — I671 Cerebral aneurysm, nonruptured: Secondary | ICD-10-CM | POA: Insufficient documentation

## 2015-08-28 LAB — CREATININE, SERUM
CREATININE: 0.96 mg/dL (ref 0.44–1.00)
GFR calc Af Amer: >60 mL/min (ref >60)

## 2015-08-28 MED ORDER — GADOBENATE DIMEGLUMINE 529 MG/ML IV SOLN
12.0000 mL | Freq: Once | INTRAVENOUS | Status: AC | PRN
  Administered 2015-08-28: 12 mL via INTRAVENOUS

## 2015-08-28 MED ORDER — LORAZEPAM 2 MG/ML IJ SOLN
INTRAMUSCULAR | Status: AC
  Filled 2015-08-28: qty 1

## 2015-08-28 MED ORDER — LORAZEPAM 2 MG/ML IJ SOLN
1.0000 mg | Freq: Once | INTRAMUSCULAR | Status: AC
  Administered 2015-08-28: 1 mg via INTRAVENOUS
  Filled 2015-08-28: qty 0.5

## 2015-09-05 ENCOUNTER — Telehealth (HOSPITAL_COMMUNITY): Payer: Self-pay | Admitting: Interventional Radiology

## 2015-09-05 NOTE — Telephone Encounter (Signed)
Called pt, left VM that she would be due for her next MRI/MRA f/u in 1 yrs time. Told her to call me if she had any questions or concerns. JM

## 2015-09-11 ENCOUNTER — Ambulatory Visit (AMBULATORY_SURGERY_CENTER): Payer: Self-pay | Admitting: *Deleted

## 2015-09-11 VITALS — Ht 63.0 in | Wt 129.0 lb

## 2015-09-11 DIAGNOSIS — R109 Unspecified abdominal pain: Secondary | ICD-10-CM

## 2015-09-11 MED ORDER — NA SULFATE-K SULFATE-MG SULF 17.5-3.13-1.6 GM/177ML PO SOLN: 1.0000 | Freq: Once | ORAL | Status: DC

## 2015-09-11 NOTE — Progress Notes (Signed)
No egg or soy allergy. No anesthesia problems.  No home O2.  No diet meds.

## 2015-09-15 ENCOUNTER — Encounter: Payer: BLUE CROSS/BLUE SHIELD | Admitting: Gastroenterology

## 2015-09-17 ENCOUNTER — Ambulatory Visit
Admission: RE | Admit: 2015-09-17 | Discharge: 2015-09-17 | Disposition: A | Discharge status: Home / Self Care | Payer: BLUE CROSS/BLUE SHIELD | Source: Ambulatory Visit

## 2015-09-17 DIAGNOSIS — Z1231 Encounter for screening mammogram for malignant neoplasm of breast: Secondary | ICD-10-CM

## 2015-09-18 ENCOUNTER — Ambulatory Visit (AMBULATORY_SURGERY_CENTER): Payer: BLUE CROSS/BLUE SHIELD | Admitting: Gastroenterology

## 2015-09-18 ENCOUNTER — Encounter: Payer: Self-pay | Admitting: Gastroenterology

## 2015-09-18 ENCOUNTER — Encounter: Payer: Self-pay | Admitting: *Deleted

## 2015-09-18 VITALS — BP 115/69 | HR 65 | Temp 97.1°F | Resp 22 | Ht 63.0 in | Wt 129.0 lb

## 2015-09-18 DIAGNOSIS — Z86010 Personal history of colonic polyps: Secondary | ICD-10-CM

## 2015-09-18 DIAGNOSIS — R109 Unspecified abdominal pain: Secondary | ICD-10-CM | POA: Diagnosis not present

## 2015-09-18 DIAGNOSIS — K621 Rectal polyp: Secondary | ICD-10-CM

## 2015-09-18 DIAGNOSIS — D129 Benign neoplasm of anus and anal canal: Secondary | ICD-10-CM

## 2015-09-18 DIAGNOSIS — D128 Benign neoplasm of rectum: Secondary | ICD-10-CM

## 2015-09-18 DIAGNOSIS — K639 Disease of intestine, unspecified: Secondary | ICD-10-CM | POA: Diagnosis not present

## 2015-09-18 DIAGNOSIS — K5010 Crohn's disease of large intestine without complications: Secondary | ICD-10-CM

## 2015-09-18 MED ORDER — SODIUM CHLORIDE 0.9 % IV SOLN: 500.0000 mL | INTRAVENOUS | Status: DC

## 2015-09-18 NOTE — Patient Instructions (Signed)
YOU HAD AN ENDOSCOPIC PROCEDURE TODAY AT THE [facility]:   Refer to the procedure report that was given to you for any specific questions about what was found during the examination.  If the procedure report does not answer your questions, please call your gastroenterologist to clarify.  If you requested that your care partner not be given the details of your procedure findings, then the procedure report has been included in a sealed envelope for you to review at your convenience later.  YOU SHOULD EXPECT: Some feelings of bloating in the abdomen. Passage of more gas than usual.  Walking can help get rid of the air that was put into your GI tract during the procedure and reduce the bloating. If you had a lower endoscopy (such as a colonoscopy or flexible sigmoidoscopy) you may notice spotting of blood in your stool or on the toilet paper. If you underwent a bowel prep for your procedure, you may not have a normal bowel movement for a few days.  Please Note:  You might notice some irritation and congestion in your nose or some drainage.  This is from the oxygen used during your procedure.  There is no need for concern and it should clear up in a day or so.  SYMPTOMS TO REPORT IMMEDIATELY:   Following lower endoscopy (colonoscopy or flexible sigmoidoscopy):  Excessive amounts of blood in the stool  Significant tenderness or worsening of abdominal pains  Swelling of the abdomen that is new, acute  Fever of 100F or higher   For urgent or emergent issues, a gastroenterologist can be reached at any hour by calling [phone].   DIET: Your first meal following the procedure should be a small meal and then it is ok to progress to your normal diet. Heavy or fried foods are harder to digest and may make you feel nauseous or bloated.  Likewise, meals heavy in dairy and vegetables can increase bloating.  Drink plenty of fluids but you should avoid alcoholic beverages for 24  hours.  ACTIVITY:  You should plan to take it easy for the rest of today and you should NOT DRIVE or use heavy machinery until tomorrow (because of the sedation medicines used during the test).    FOLLOW UP: Our staff will call the number listed on your records the next business day following your procedure to check on you and address any questions or concerns that you may have regarding the information given to you following your procedure. If we do not reach you, we will leave a message.  However, if you are feeling well and you are not experiencing any problems, there is no need to return our call.  We will assume that you have returned to your regular daily activities without incident.  If any biopsies were taken you will be contacted by phone or by letter within the next 1-3 weeks.  Please call us at [phone] if you have not heard about the biopsies in 3 weeks.    SIGNATURES/CONFIDENTIALITY: You and/or your care partner have signed paperwork which will be entered into your electronic medical record.  These signatures attest to the fact that that the information above on your After Visit Summary has been reviewed and is understood.  Full responsibility of the confidentiality of this discharge information lies with you and/or your care-partner. 

## 2015-09-18 NOTE — Progress Notes (Signed)
Called to room to assist during endoscopic procedure.  Patient ID and intended procedure confirmed with present staff. Received instructions for my participation in the procedure from the performing physician.  

## 2015-09-18 NOTE — Op Note (Signed)
[facility]  [address] [place], [id number]   COLONOSCOPY PROCEDURE REPORT  PATIENT: Stacy Hansen, Stacy Hansen  MR#: [id number] BIRTHDATE: 01/24/1951 , 64  yrs. old GENDER: female ENDOSCOPIST: Harl Bowie, MD REFERRED JO:ITGPQD Ouida Sills, F.N.P.-B.C. PROCEDURE DATE:  09/18/2015 PROCEDURE:   Colonoscopy, diagnostic, Colonoscopy with biopsy, Colonoscopy with cold biopsy polypectomy, and Colonoscopy with snare polypectomy First Screening Colonoscopy - Avg.  risk and is 50 yrs.  old or older - No.  Prior Negative Screening - Now for repeat screening. N/A  History of Adenoma - Now for follow-up colonoscopy & has been > or = to 3 yrs.  N/A  Polyps removed today? Yes ASA CLASS:   Class II INDICATIONS:Inflammatory bowel disease of the intestine if more precise diagnosis or determination of the extent / severity of activity of disease will influence immediate / future management and Colorectal Neoplasm Risk Assessment for this procedure is average risk. MEDICATIONS: Propofol 230 IV  DESCRIPTION OF PROCEDURE:   After the risks benefits and alternatives of the procedure were thoroughly explained, informed consent was obtained.  The digital rectal exam revealed no abnormalities of the rectum.   The LB PCF Q180 [id number]  endoscope was introduced through the anus and advanced to the terminal ileum which was intubated for a short distance. No adverse events experienced.   The quality of the prep was good.  The instrument was then slowly withdrawn as the colon was fully examined. Estimated blood loss is zero unless otherwise noted in this procedure report.   COLON FINDINGS: The examined terminal ileum appeared to be normal. Multiple biopsies were performed using cold forceps.   The colonic mucosa appeared normal in the left colon and right colon.  Multiple biopsies were performed using cold forceps.   Multiple flat hyperplastic appearing polyps were found in the rectum, 48m  polyp removed by hot snare, not retrived and 370mpolyp removed by biopsy. Retroflexed views revealed internal hemorrhoids. The time to cecum = 6.1 Withdrawal time = 12.1   The scope was withdrawn and the procedure completed. COMPLICATIONS: There were no immediate complications.  ENDOSCOPIC IMPRESSION: Normal appearing mucosa Multiple hyperplastic appearing polyps in rectum, 2 removed.  RECOMMENDATIONS: 1.  Await biopsy results 2.  If the polyp(s) removed today are proven to be adenomatous (pre-cancerous) polyps, you will need a repeat colonoscopy in 5 years.  Otherwise you should continue to follow colorectal cancer screening guidelines for "routine risk" patients with colonoscopy in 10 years.  You will receive a letter within 1-2 weeks with the results of your biopsy as well as final recommendations.  Please call my office if you have not received a letter after 3 weeks.  eSigned:  KaHarl BowieMD 09/18/2015 9:53 AM

## 2015-09-18 NOTE — Progress Notes (Signed)
Report to PACU, RN, vss, BBS= Clear.  

## 2015-09-19 ENCOUNTER — Telehealth: Payer: Self-pay | Admitting: *Deleted

## 2015-09-19 NOTE — Telephone Encounter (Signed)
  Follow up Call-  Call back number 09/18/2015  Post procedure Call Back phone  # [phone]  Permission to leave phone message Yes     Patient questions:  Do you have a fever, pain , or abdominal swelling? No. Pain Score  0 *  Have you tolerated food without any problems? Yes.    Have you been able to return to your normal activities? Yes.    Do you have any questions about your discharge instructions: Diet   No. Medications  No. Follow up visit  No.  Do you have questions or concerns about your Care? No.  Actions: * If pain score is 4 or above: No action needed, pain <4.

## 2015-10-13 ENCOUNTER — Encounter: Payer: Self-pay | Admitting: Gastroenterology

## 2015-11-24 ENCOUNTER — Ambulatory Visit: Payer: BLUE CROSS/BLUE SHIELD | Admitting: Gastroenterology

## 2015-12-07 IMAGING — CT CT HEAD W/O CM
1 series · 16 of 30 positions shown, 20 images · non-contrast
Comparison: CT brain 07/10/2013

CLINICAL DATA: Code stroke.  Left-sided numbness.

EXAM:
CT HEAD WITHOUT CONTRAST
TECHNIQUE: Contiguous axial images were obtained from the base of the skull
through the vertex without intravenous contrast.

[Series 2: headtrauma 4.8 h37s · axial · 0.43mm/px · z∈[+79,+208]mm · 16 of 30 slices shown, 20 images]
[im 2/30  brain]
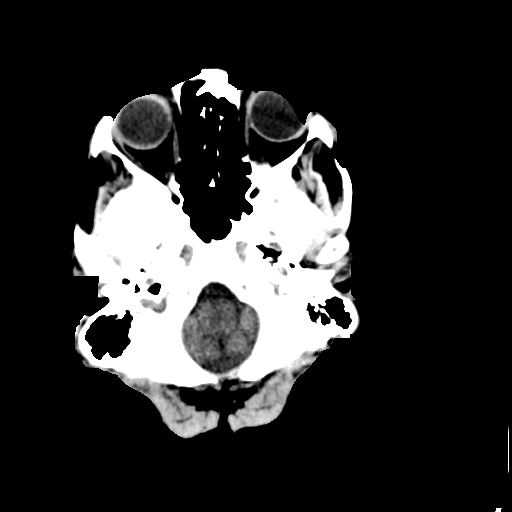
[im 2/30  bone]
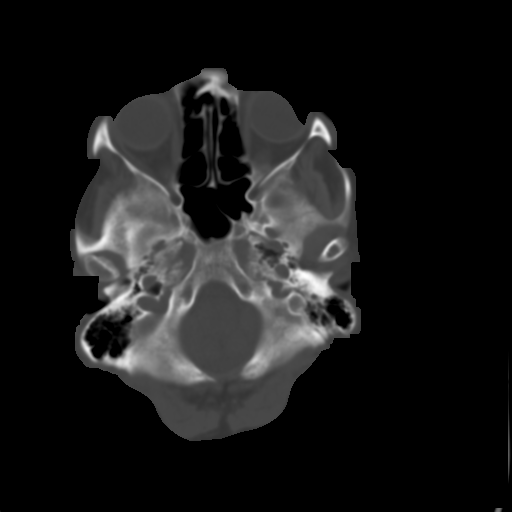
[im 4/30  brain]
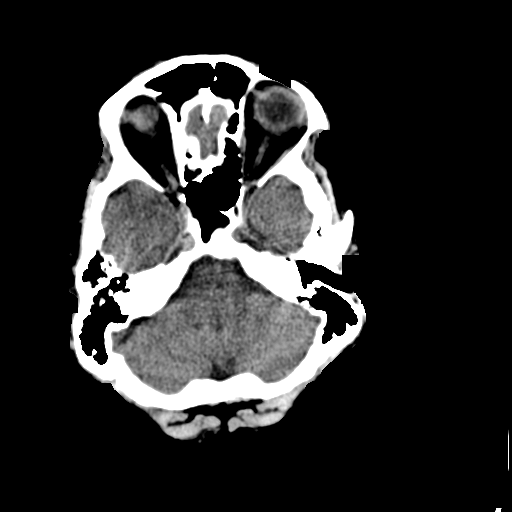
[im 6/30  brain]
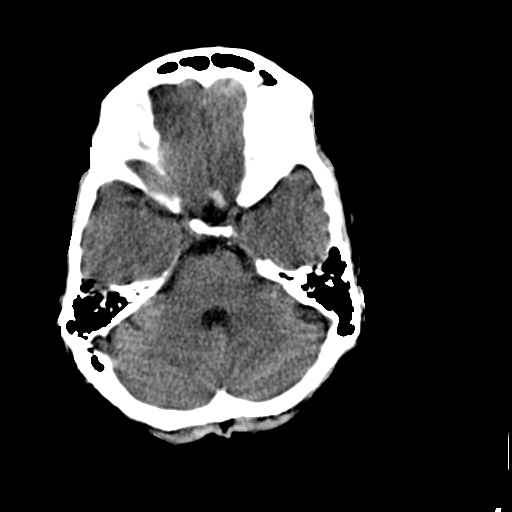
[im 8/30  brain]
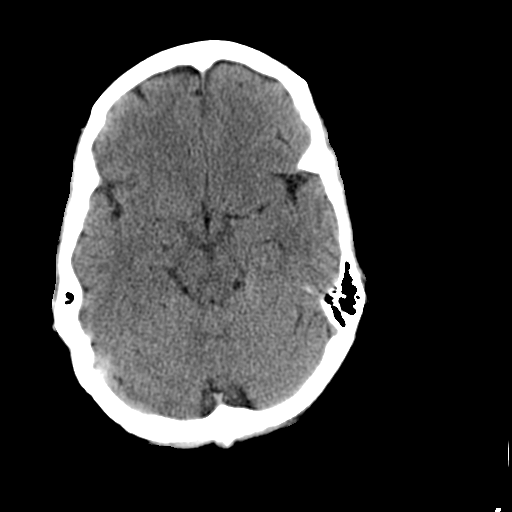
[im 9/30  brain]
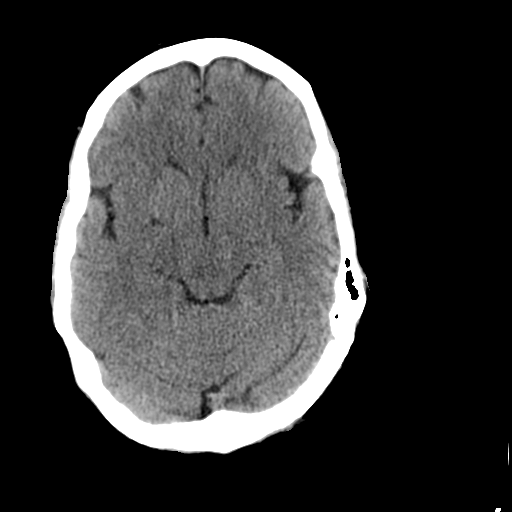
[im 9/30  bone]
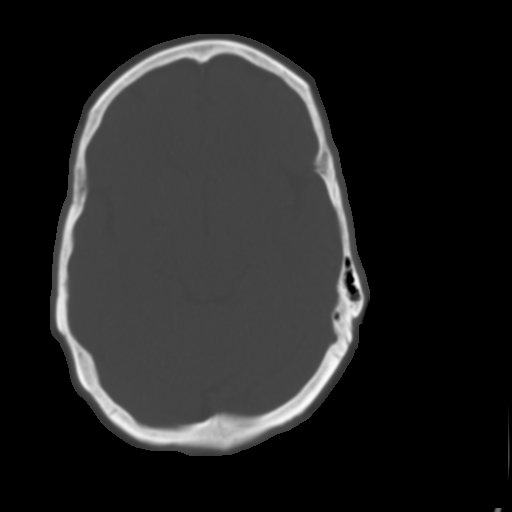
[im 11/30  brain]
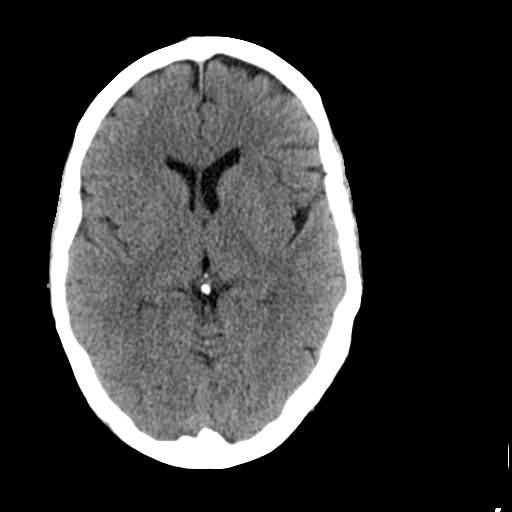
[im 13/30  brain]
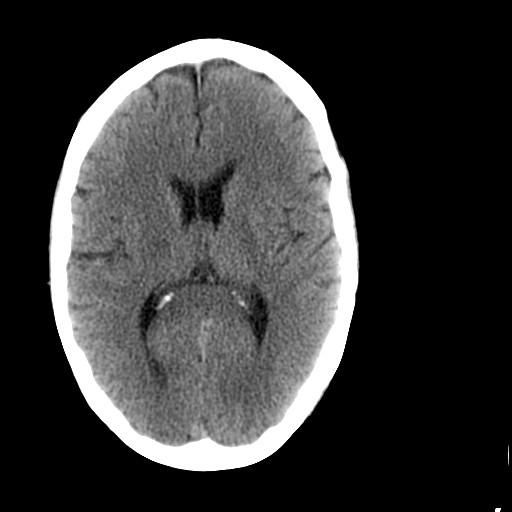
[im 15/30  brain]
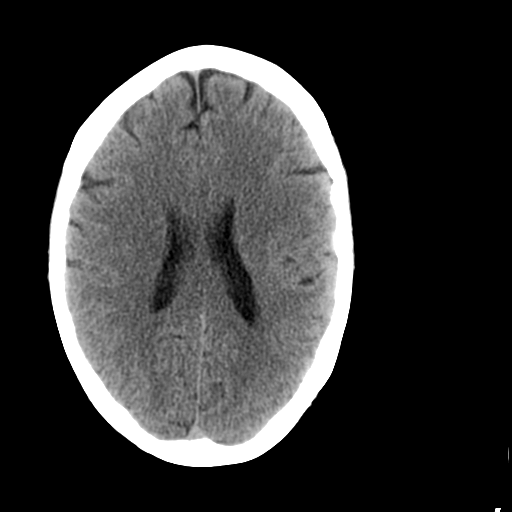
[im 16/30  brain]
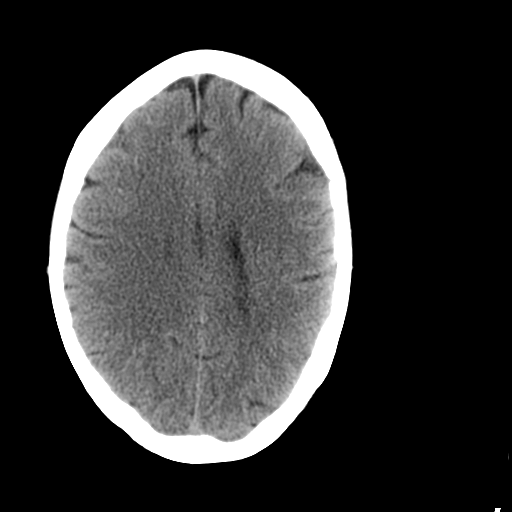
[im 16/30  bone]
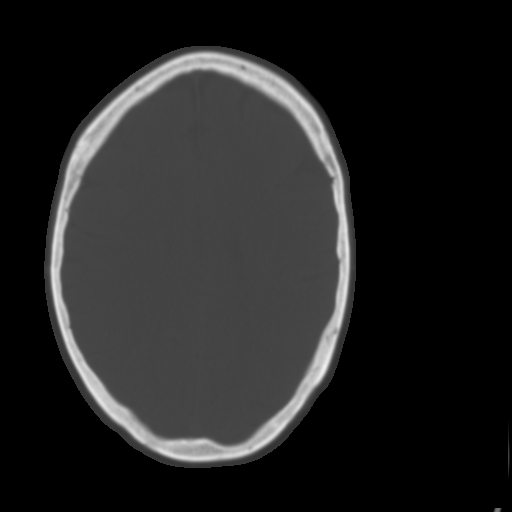
[im 18/30  brain]
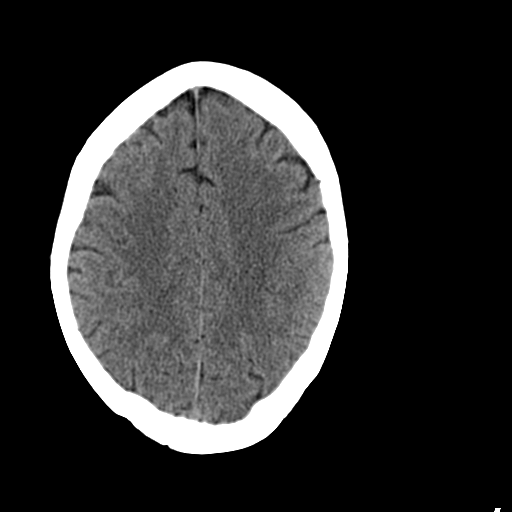
[im 20/30  brain]
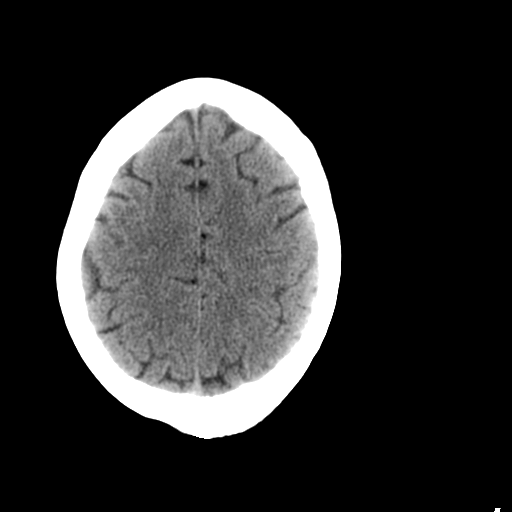
[im 22/30  brain]
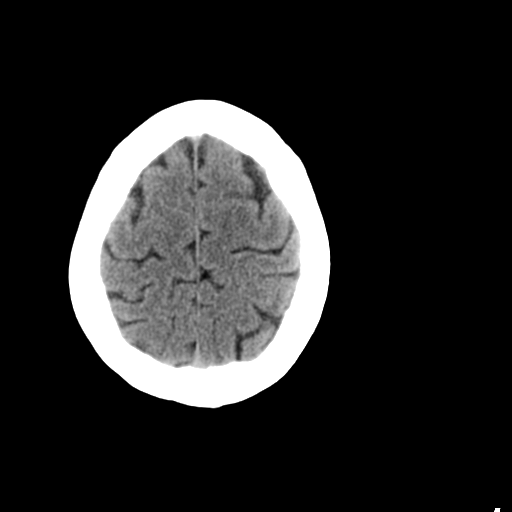
[im 23/30  brain]
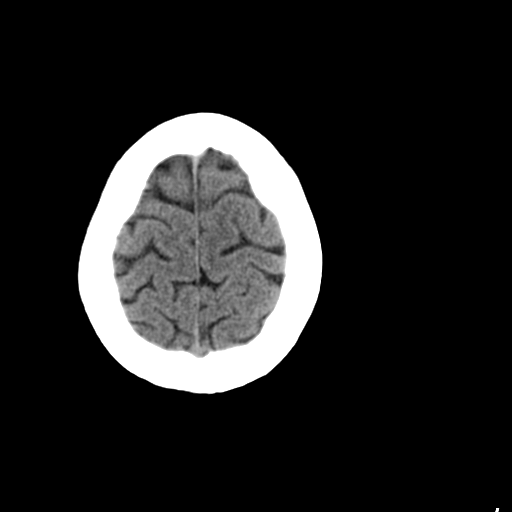
[im 23/30  bone]
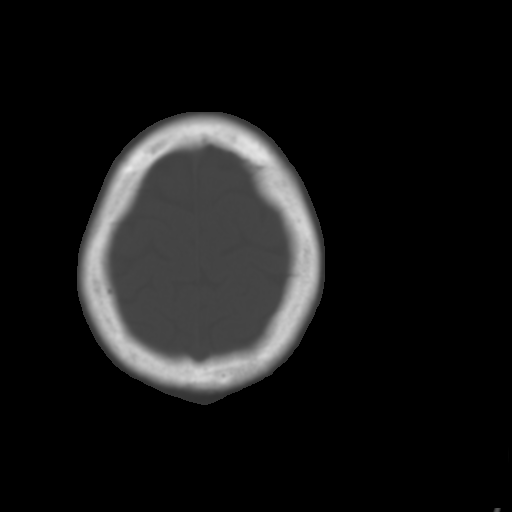
[im 25/30  brain]
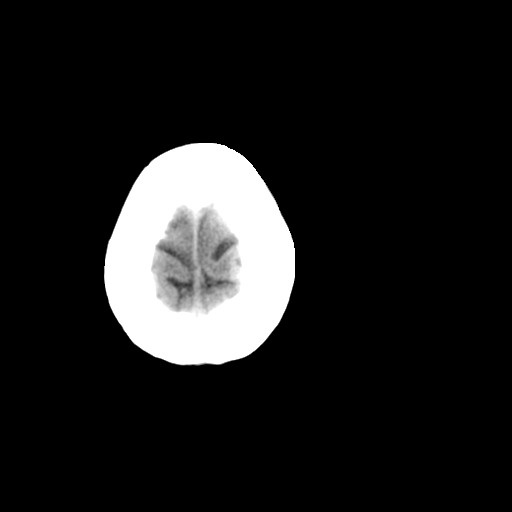
[im 27/30  brain]
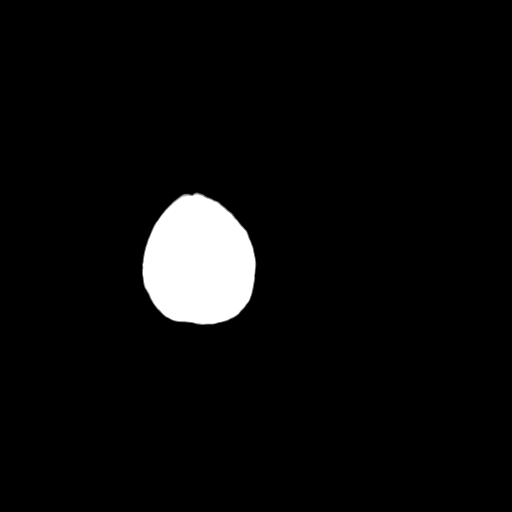
[im 29/30  brain]
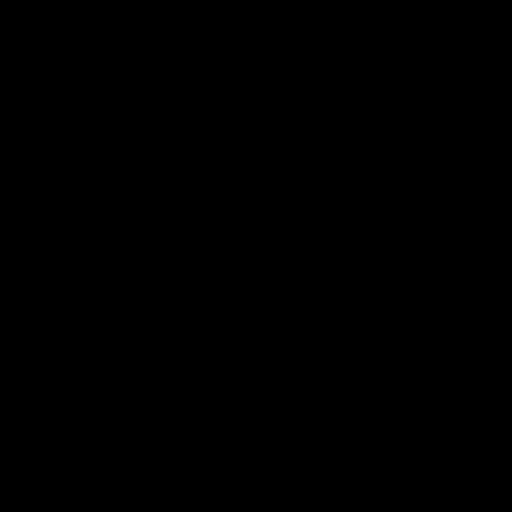

[16 of 30 positions shown; findings below may reference images not displayed]

FINDINGS: Ventricles and sulci are appropriate for patient's age. No evidence
for acute cortically based infarct, intracranial hemorrhage, mass
lesion or mass effect. Probable old right basal ganglia infarct.
Paranasal sinuses are unremarkable. Mastoid air cells are well
aerated. Calvarium is intact. Orbits are unremarkable.
IMPRESSION: No acute intracranial process.

Critical Value/emergent results were called by telephone at the time
of interpretation on 11/02/2014 at [DATE] to Dr. PANAINTI LINHOLM ,
who verbally acknowledged these results.

## 2015-12-07 IMAGING — CT CT ANGIO HEAD
1 of 8 series · 3 of 33 positions shown · IV contrast (Omnipaque 300)
Comparison: Head CT without contrast 4407 hr today. Intracranial
MRA 07/10/2013.

CLINICAL DATA: 63-year-old female with left facial numbness, code
stroke. Left side numbness. Initial encounter.

EXAM:
CT ANGIOGRAPHY HEAD AND NECK
TECHNIQUE: Multidetector CT imaging of the head and neck was performed using
the standard protocol during bolus administration of intravenous
contrast. Multiplanar CT image reconstructions and MIPs were
obtained to evaluate the vascular anatomy. Carotid stenosis
measurements (when applicable) are obtained utilizing NASCET
criteria, using the distal internal carotid diameter as the
denominator.
CONTRAST:  100mL OMNIPAQUE IOHEXOL 350 MG/ML SOLN

[Series 6: cta head/neck pac · axial · 0.49mm/px · z∈[+81,+397]mm · 3 of 159 slices shown]
[im 1/159  soft-tissue]
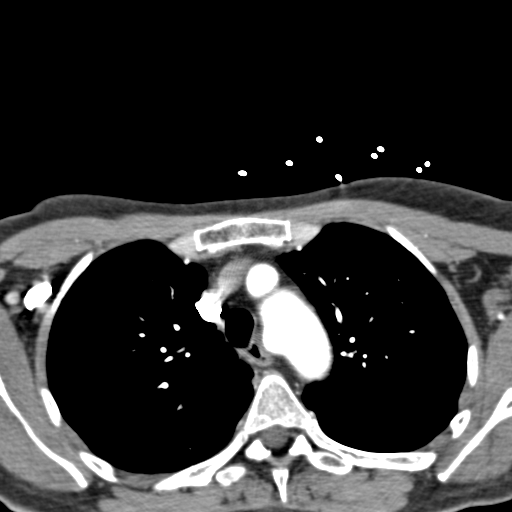
[im 80/159  bone]
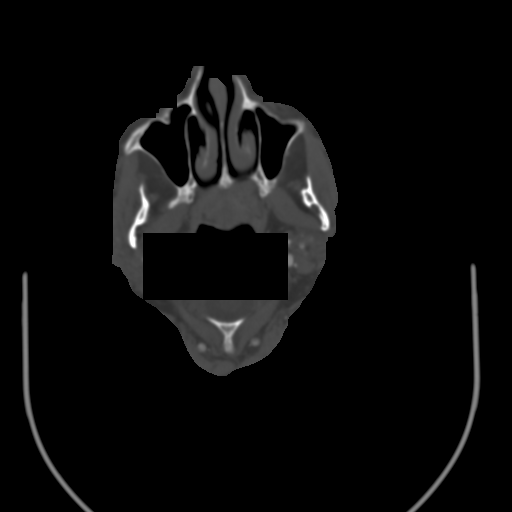
[im 159/159  soft-tissue]
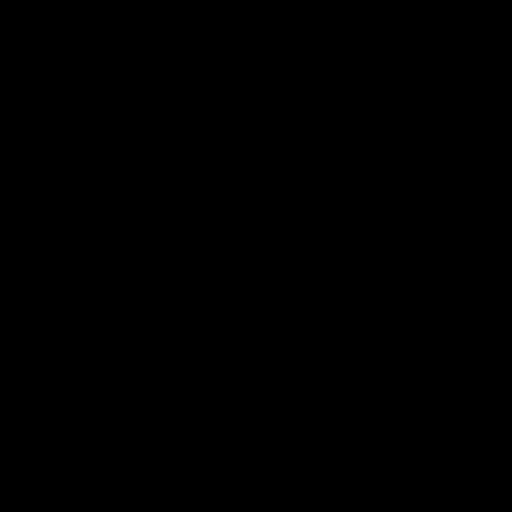

[3 of 33 positions shown; findings below may reference images not displayed]

FINDINGS: CTA HEAD FINDINGS

Stable gray-white matter differentiation from earlier today. No
cytotoxic edema identified. No intracranial mass effect or
hemorrhage identified. No abnormal enhancement identified.

VASCULAR FINDINGS:

Major intracranial venous structures are enhancing.

Diminutive proximal vertebrobasilar system. The non dominant distal
right vertebral artery terminates in the right PICA. The left PICA
is patent. Thread-like proximal basilar artery. Persistent right
trigeminal artery supplying the distal basilar. SCA and left PCA
origins are within normal limits. There is also a fetal type right
PCA origin. The left posterior communicating artery also is present.
Bilateral PCA branches are within normal limits.

No ICA siphon atherosclerosis or stenosis. Persistent right
trigeminal artery. Ophthalmic and posterior communicating artery
origins are within normal limits. There is an infundibulum at the
left PComm origin.

Normal carotid termini. Normal MCA and ACA origins. Diminutive
anterior communicating artery. Bilateral ACA branches are within
normal limits. Left MCA branches are within normal limits.

Right MCA M1 segment is within normal limits. The right MCA
bifurcation is patent. The right MCA anterior division branches
appear diminutive compared to those on the left (series 602, image
49). No major branch occlusion is identified.

Review of the MIP images confirms the above findings.

CTA NECK FINDINGS

Paraseptal emphysema and apical scarring in the lung apices. No
superior mediastinal lymphadenopathy. Negative thyroid, larynx,
pharynx, parapharyngeal spaces, retropharyngeal space, sublingual
space, submandibular glands, and parotid glands. Postoperative
changes to the globes. Negative paranasal sinuses and mastoids.
Dentition absent. No acute osseous abnormality identified. No
cervical lymphadenopathy.

VASCULAR FINDINGS:

Normal anatomic variation of the aortic arch anatomy. There is a
shared common carotid artery origin, and aberrant origin of the
right subclavian artery. No arch atherosclerosis or great vessel
origin stenosis.

Both common carotid origins are normally patent.

Intermittent motion artifact in the neck. Normal right CCA and right
carotid bifurcation. Normal cervical right ICA.

Aberrant origin of the right subclavian artery passing posterior to
the trachea and esophagus at the thoracic inlet no proximal right
subclavian artery stenosis. Normal right vertebral artery origin.
The right vertebral artery is non dominant and diminutive throughout
the neck, but patent to the skullbase.

No proximal left subclavian artery stenosis. Normal left vertebral
artery origin. Mildly dominant left vertebral artery is normal to
the skullbase.

Review of the MIP images confirms the above findings.
IMPRESSION: 1. No major circle of Willis branch occlusion or proximal stenosis,
but the right MCA anterior division vessels do appear diminutive
compared to those on the left. No changes of anterior left MCA
ischemia are evident on CT at this time.
2. Multiple anatomic variations including a persistent right
trigeminal artery, a shared common carotid artery origin off of the
aortic arch, and an aberrant origin of the right subclavian artery.
3. Otherwise negative CTA with no atherosclerosis or stenosis in the
neck.

## 2015-12-08 IMAGING — US US CAROTID DUPLEX BILAT
1 series · 13 of 24 positions shown · non-contrast
Comparison: None.

CLINICAL DATA: Left-sided weakness

EXAM:
BILATERAL CAROTID DUPLEX ULTRASOUND
TECHNIQUE: Gray scale imaging, color Doppler and duplex ultrasound were
performed of bilateral carotid and vertebral arteries in the neck.

[Series 1: us carotid duplex bilat · 0.04mm/px · 13 of 68 slices shown]
[im 1/68]
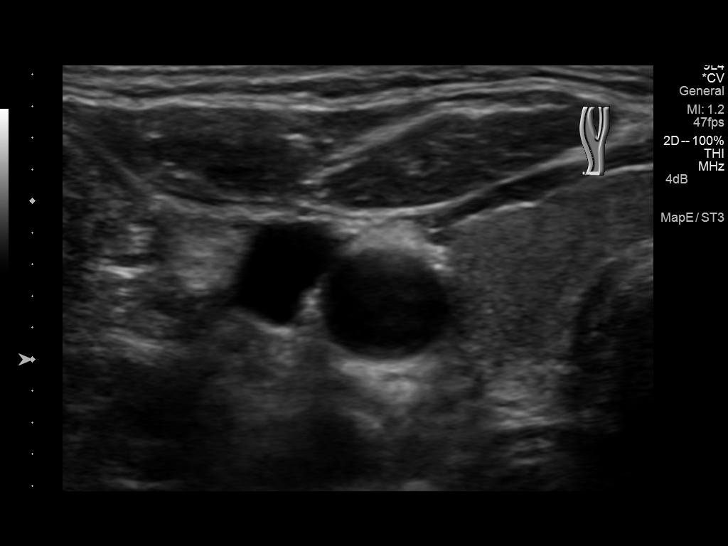
[im 6/68]
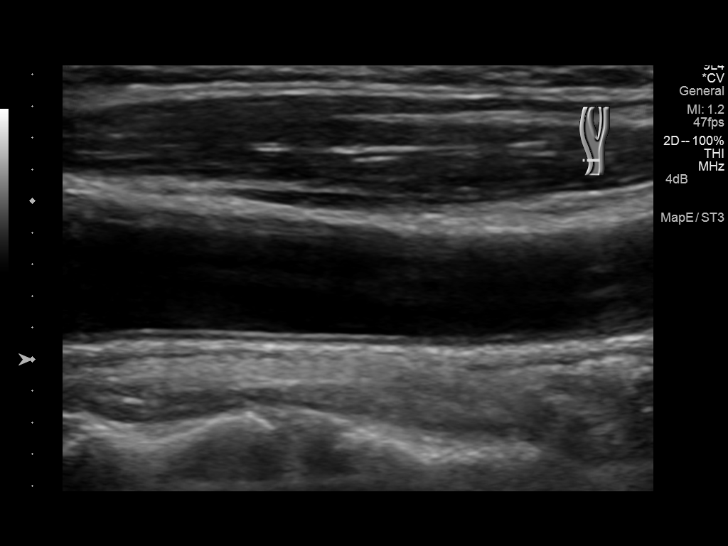
[im 12/68]
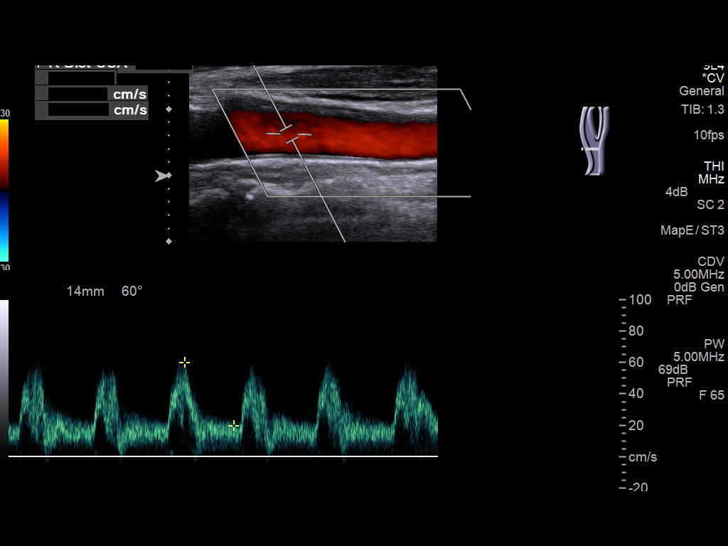
[im 18/68]
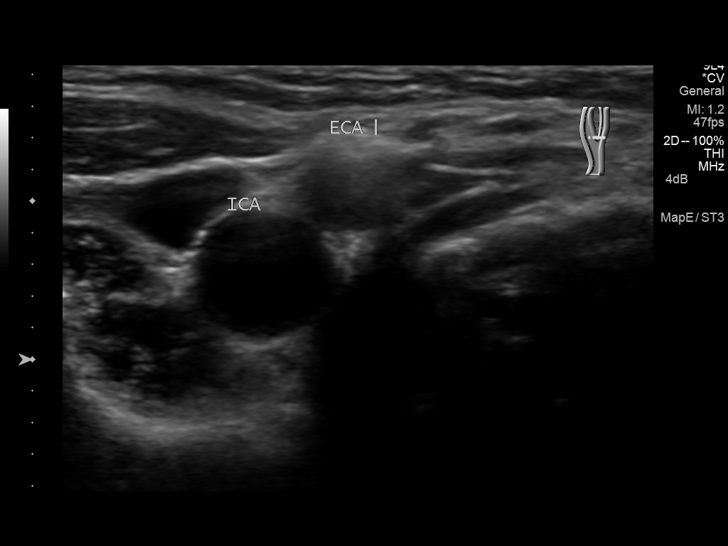
[im 24/68]
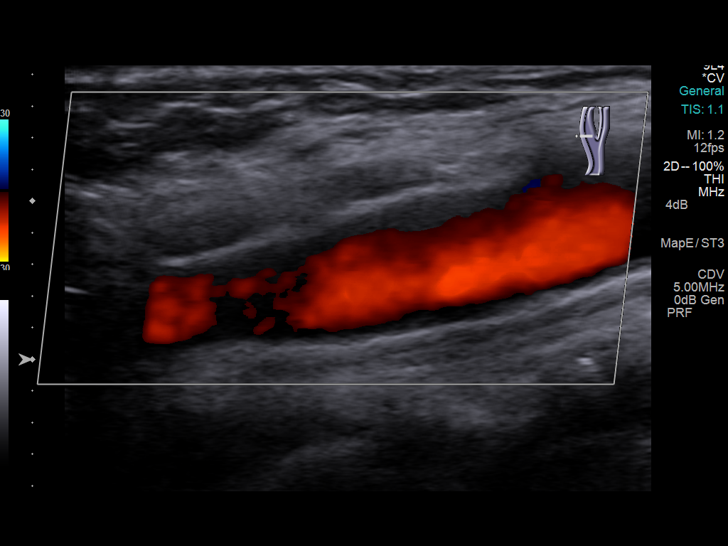
[im 30/68]
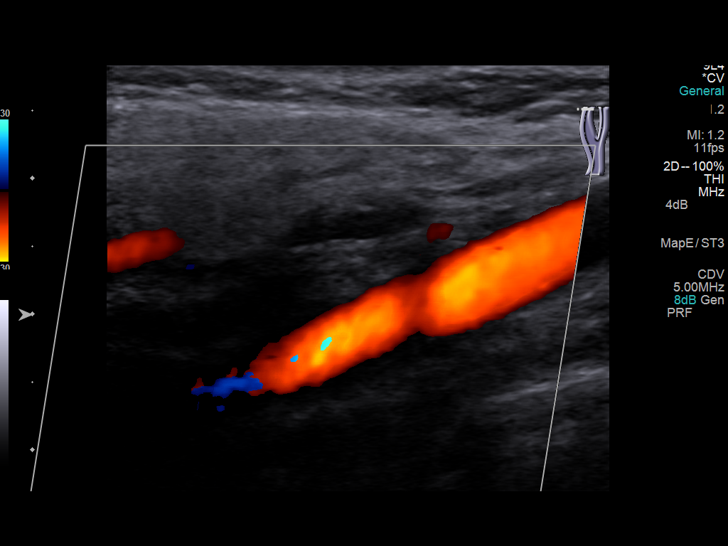
[im 35/68]
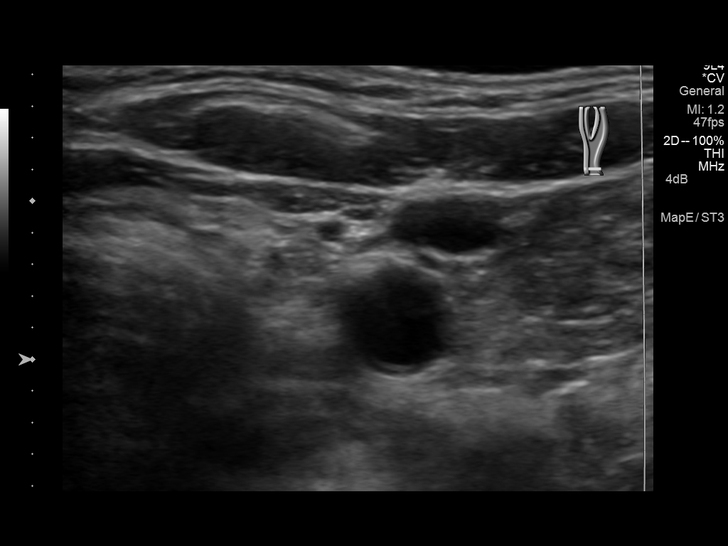
[im 38/68]
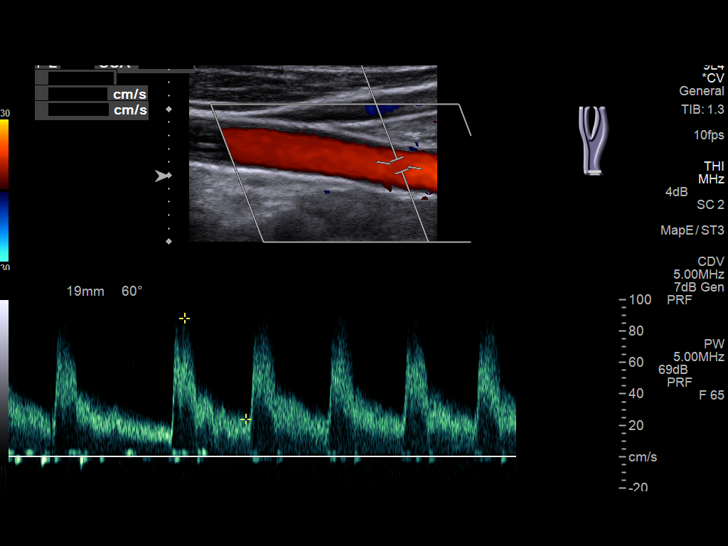
[im 44/68]
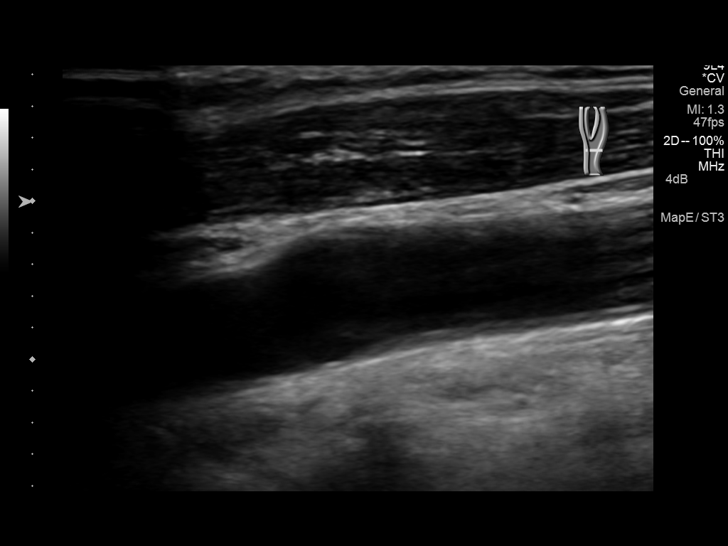
[im 50/68]
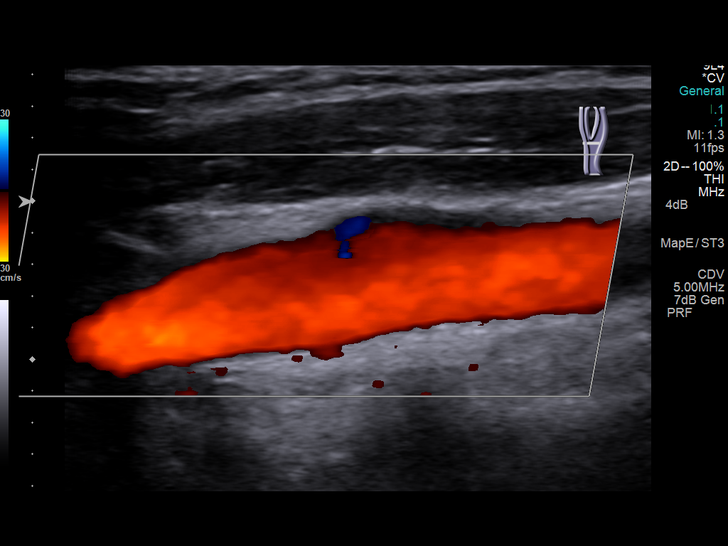
[im 56/68]
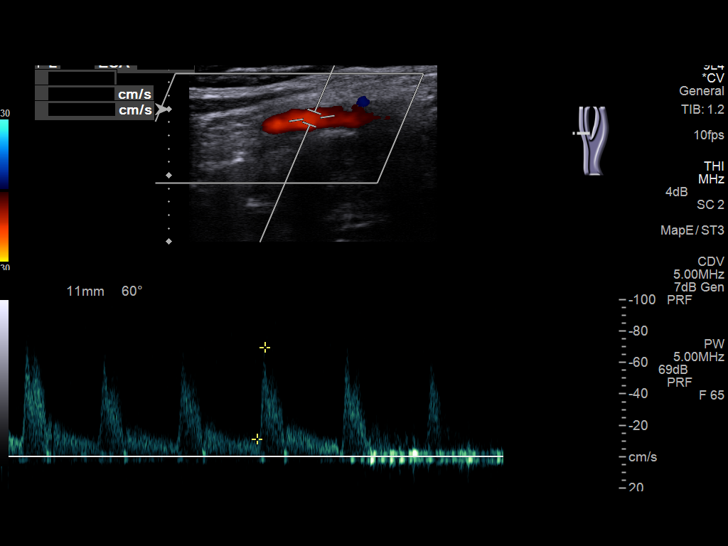
[im 62/68]
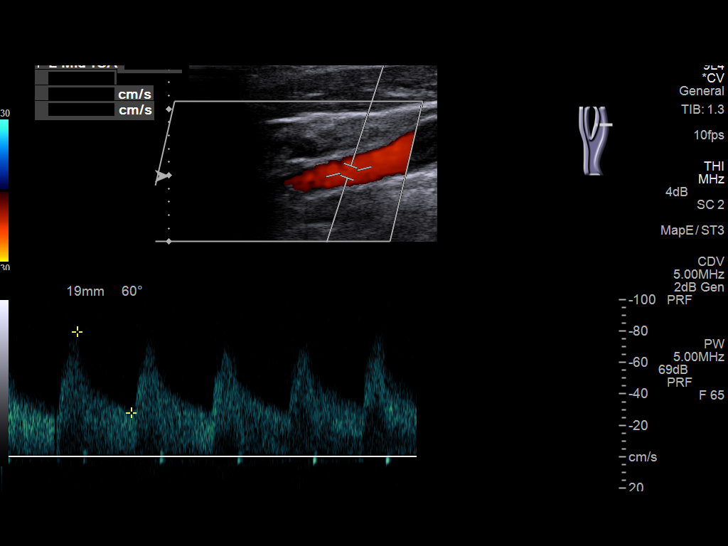
[im 68/68]
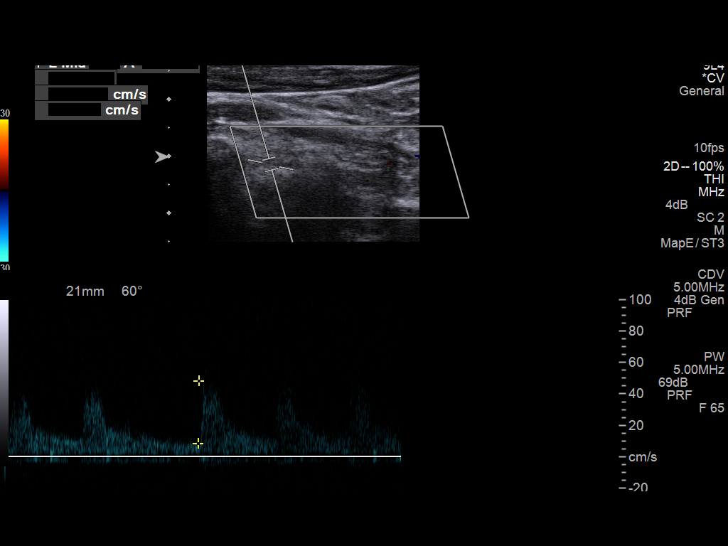

[13 of 24 positions shown; findings below may reference images not displayed]

FINDINGS: Criteria: Quantification of carotid stenosis is based on velocity
parameters that correlate the residual internal carotid diameter
with NASCET-based stenosis levels, using the diameter of the distal
internal carotid lumen as the denominator for stenosis measurement.

The following peak systolic velocity measurements were obtained:

RIGHT

ICA:  90 cm/sec

CCA:  71 cm/sec

SYSTOLIC ICA/CCA RATIO:

DIASTOLIC ICA/CCA RATIO:

ECA:  81 cm/sec

LEFT

ICA:  97 cm/sec

CCA:  72 cm/sec

SYSTOLIC ICA/CCA RATIO:

DIASTOLIC ICA/CCA RATIO:

ECA:  72 cm/sec

RIGHT CAROTID ARTERY: There is intimal thickening diffusely. There
is smooth plaque in the distal right common carotid artery extending
into the bifurcation and proximal right internal carotid artery
causing approximately 20-25% diameter stenosis. No stenosis
approaching 50% diameter is seen by real-time interrogation.

RIGHT VERTEBRAL ARTERY:  Flow is antegrade.

LEFT CAROTID ARTERY: There is mild generalized intimal thickening.
There is smooth plaque in the distal common carotid artery extending
into the bifurcation and proximal internal carotid artery causing
approximately 20% diameter stenosis. No stenosis approaching 50%
diameter is seen.

LEFT VERTEBRAL ARTERY:  Flow is antegrade.
IMPRESSION: There is mild plaque formation in both bifurcation and proximal
internal carotid artery regions. No stenosis approaching 50%
diameter seen on either side. Flow in both vertebral arteries is in
the anatomic direction.

## 2016-01-22 ENCOUNTER — Encounter: Payer: Self-pay | Admitting: Gastroenterology

## 2016-01-22 ENCOUNTER — Ambulatory Visit (INDEPENDENT_AMBULATORY_CARE_PROVIDER_SITE_OTHER): Payer: BLUE CROSS/BLUE SHIELD | Admitting: Gastroenterology

## 2016-01-22 VITALS — BP 108/70 | HR 88 | Ht 62.75 in | Wt 126.1 lb

## 2016-01-22 DIAGNOSIS — R1310 Dysphagia, unspecified: Secondary | ICD-10-CM | POA: Diagnosis not present

## 2016-01-22 DIAGNOSIS — R1013 Epigastric pain: Secondary | ICD-10-CM

## 2016-01-22 DIAGNOSIS — K219 Gastro-esophageal reflux disease without esophagitis: Secondary | ICD-10-CM | POA: Diagnosis not present

## 2016-01-22 MED ORDER — PANTOPRAZOLE SODIUM 40 MG PO TBEC: 40.0000 mg | DELAYED_RELEASE_TABLET | Freq: Every day | ORAL | Status: DC

## 2016-01-22 NOTE — Patient Instructions (Addendum)
You have been scheduled for an endoscopy. Please follow written instructions given to you at your visit today. If you use inhalers (even only as needed), please bring them with you on the day of your procedure. Your physician has requested that you go to www.startemmi.com and enter the access code given to you at your visit today. This web site gives a general overview about your procedure. However, you should still follow specific instructions given to you by our office regarding your preparation for the procedure.  We have sent the following medications to your pharmacy for you to pick up at your convenience:Protonix.  Follow up in 3 months

## 2016-01-22 NOTE — Progress Notes (Signed)
JALAYA SARVER    [id number]    12-12-1950  Primary Care Physician:ANDERSON,TERESA, FNP  Referring Physician: Vicenta Aly, [address], [place] [id number]  Chief complaint:  Nausea, epigastric pain  HPI: 65 year old female here for follow-up visit with complaints of intermittent nausea and epigastric pain and dysphagia for the past few months. Her symptoms are worse at early in the morning when she wakes up. No relationship with diet. She takes Tums or Rolaids as needed for heartburn. She sometimes feels the food gets stuck in her lower chest and has pain associated with it but does eventually pass. No history of food impactions requiring ER visit or EGD to disimpact. No weight loss. Per patient she was diagnosed with Crohn's disease but most recent labs and biopsies from colonoscopy not concerning for IBD or Crohn's disease .  Outpatient Encounter Prescriptions as of 01/22/2016  Medication Sig  . albuterol (PROVENTIL HFA;VENTOLIN HFA) 108 (90 BASE) MCG/ACT inhaler Inhale 2 puffs into the lungs every 6 (six) hours as needed for wheezing or shortness of breath.  Marland Kitchen aspirin 325 MG tablet Take 1 tablet (325 mg total) by mouth daily.  . butalbital-acetaminophen-caffeine (FIORICET, ESGIC) 50-325-40 MG per tablet Take 1 tablet by mouth every 6 (six) hours as needed for headache.  . carboxymethylcellulose (REFRESH PLUS) 0.5 % SOLN Apply 1 drop to eye 3 (three) times daily as needed (dry/itching eyes).   . Cholecalciferol (VITAMIN D3) 2000 UNITS capsule Take 5,000 mg by mouth.  Marland Kitchen LORazepam (ATIVAN) 0.5 MG tablet TAKE ONE TABLET BY MOUTH ONCE DAILY AS NEEDED  . vitamin B-12 (CYANOCOBALAMIN) 1000 MCG tablet Take 1,000 mcg by mouth daily.  . pantoprazole (PROTONIX) 40 MG tablet Take 1 tablet (40 mg total) by mouth daily.   No facility-administered encounter medications on file as of 01/22/2016.    Allergies as of 01/22/2016 - Review Complete 01/22/2016    Allergen Reaction Noted  . Ciprofloxacin Other (See Comments) 07/09/2011  . Meperidine hcl Nausea And Vomiting and Other (See Comments)   . Opium Other (See Comments) 10/23/2013  . Promethazine hcl Nausea And Vomiting and Other (See Comments)   . Propoxyphene n-acetaminophen Other (See Comments)     Past Medical History  Diagnosis Date  . Atypical chest pain   . Tobacco abuse   . History of fibromyalgia   . Crohn disease ([place])   . Depression   . Anxiety   . Brain aneurysm   . Hyperplastic colon polyp   . GERD (gastroesophageal reflux disease)   . Panic attacks   . Stroke ([place])   . Inflammatory bowel disease   . Chronic abdominal pain   . 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 Orif ulnar fracture Left   . Rotator cuff repair Left   . Vaginal hysterectomy      has her ovaries  . Tubal ligation    . Tonsillectomy    . Cataract extraction Bilateral   . Varicose vein surgery Right     Family History  Problem Relation Age of Onset  . Diabetes Mother   . Kidney failure Mother   . Diabetes Brother   . Stroke Father   . Diabetes Sister     x 2  . Colon cancer Brother     in his 73's    Social History  Social History  . Marital Status: Married    Spouse Name: N/A  . Number of Children: 2  . 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 Smoking status: Former Smoker -- 0.25 packs/day for 3 years    Types: Cigarettes    Quit date: 01/19/2015  . Smokeless tobacco: Never Used     Comment: one pack/ 3 days, quit for a year, Tobacco info given 03/07/13  . Alcohol Use: No  . Drug Use: No  . Sexual Activity: Yes    Birth Control/ Protection: None   Other Topics Concern  . Not on file   Social History Narrative      Review of systems: Review of Systems  Constitutional: Negative for fever and chills.  HENT: Negative.   Eyes:  Negative for blurred vision.  Respiratory: Negative for cough, shortness of breath and wheezing.   Cardiovascular: Negative for chest pain and palpitations.  Gastrointestinal: as per HPI Genitourinary: Negative for dysuria, urgency, frequency and hematuria.  Musculoskeletal: Negative for myalgias, back pain and joint pain.  Skin: Negative for itching and rash.  Neurological: Negative for dizziness, tremors, focal weakness, seizures and loss of consciousness.  Endo/Heme/Allergies: Negative for environmental allergies.  Psychiatric/Behavioral: Negative for depression, suicidal ideas and hallucinations.  All other systems reviewed and are negative.   Physical Exam: Filed Vitals:   01/22/16 1027  BP: 108/70  Pulse: 88   Gen:      No acute distress HEENT:  EOMI, sclera anicteric Neck:     No masses; no thyromegaly Lungs:    Clear to auscultation bilaterally; normal respiratory effort CV:         Regular rate and rhythm; no murmurs Abd:      + bowel sounds; soft, non-tender; no palpable masses, no distension Ext:    No edema; adequate peripheral perfusion Skin:      Warm and dry; no rash Neuro: alert and oriented x 3 Psych: normal mood and affect  Data Reviewed:  Reviewed pertinent past GI workup and data in epic  Assessment and Plan/Recommendations: 65 year old female with complaints of worsening epigastric pain, nausea, acid reflux and intermittent difficulty swallowing Likely all her symptoms are related to uncontrolled GERD Start protonix 40 mg once a day, 30 minutes before breakfast and follow antireflux measures We'll schedule for EGD for evaluation of possible esophagitis and stricture Return after the procedure  Damaris Hippo , MD [phone] Mon-Fri 8a-5p [phone] after 5p, weekends, holidays

## 2016-02-11 DIAGNOSIS — J301 Allergic rhinitis due to pollen: Secondary | ICD-10-CM | POA: Insufficient documentation

## 2016-02-26 ENCOUNTER — Encounter: Payer: BLUE CROSS/BLUE SHIELD | Admitting: Gastroenterology

## 2016-03-31 ENCOUNTER — Ambulatory Visit (AMBULATORY_SURGERY_CENTER): Payer: BLUE CROSS/BLUE SHIELD | Admitting: Gastroenterology

## 2016-03-31 ENCOUNTER — Encounter: Payer: Self-pay | Admitting: Gastroenterology

## 2016-03-31 VITALS — BP 107/66 | HR 62 | Temp 97.7°F | Resp 16 | Ht 63.0 in | Wt 129.0 lb

## 2016-03-31 DIAGNOSIS — K210 Gastro-esophageal reflux disease with esophagitis: Secondary | ICD-10-CM

## 2016-03-31 DIAGNOSIS — K219 Gastro-esophageal reflux disease without esophagitis: Secondary | ICD-10-CM

## 2016-03-31 MED ORDER — OMEPRAZOLE 40 MG PO CPDR: 40.0000 mg | DELAYED_RELEASE_CAPSULE | Freq: Every day | ORAL | Status: DC

## 2016-03-31 MED ORDER — SODIUM CHLORIDE 0.9 % IV SOLN: 500.0000 mL | INTRAVENOUS | Status: DC

## 2016-03-31 NOTE — Op Note (Signed)
[facility] Patient Name: Stacy Hansen Procedure Date: 03/31/2016 9:05 AM MRN: [id number] Endoscopist: Mauri Pole , MD Age: 65 Referring MD:  Date of Birth: January 20, 1951 Gender: Female Procedure:                Upper GI endoscopy Indications:              Dysphagia, Heartburn Medicines:                Monitored Anesthesia Care Procedure:                Pre-Anesthesia Assessment:                           - Prior to the procedure, a History and Physical                            was performed, and patient medications and                            allergies were reviewed. The patient's tolerance of                            previous anesthesia was also reviewed. The risks                            and benefits of the procedure and the sedation                            options and risks were discussed with the patient.                            All questions were answered, and informed consent                            was obtained. Prior Anticoagulants: The patient has                            taken no previous anticoagulant or antiplatelet                            agents. ASA Grade Assessment: II - A patient with                            mild systemic disease. After reviewing the risks                            and benefits, the patient was deemed in                            satisfactory condition to undergo the procedure.                           After obtaining informed consent, the endoscope was  passed under direct vision. Throughout the                            procedure, the patient's blood pressure, pulse, and                            oxygen saturations were monitored continuously. The                            Model GIF-HQ190 [id number] scope was introduced                            through the mouth, and advanced to the second part                            of duodenum. The upper GI endoscopy was                     accomplished without difficulty. The patient                            tolerated the procedure well. Scope In: Scope Out: Findings:                 LA Grade C (one or more mucosal breaks continuous                            between tops of 2 or more mucosal folds, less than                            75% circumference) esophagitis with no bleeding was                            found 34 to 35 cm from the incisors. No strictures                           Multiple dispersed, less than 5 mm bleeding                            erosions were found in the gastric antrum.                            Petechiae and erythema in the entire stomach. There                            were stigmata of recent bleeding. Biopsies were                            taken with a cold forceps for Helicobacter pylori                            testing.                           The first portion of the  duodenum and second                            portion of the duodenum were normal. Complications:            No immediate complications. Estimated Blood Loss:     Estimated blood loss was minimal. Impression:               - LA Grade C erosive esophagitis.                           - Bleeding erosive gastropathy. Biopsied.                           - Normal first portion of the duodenum and second                            portion of the duodenum. Recommendation:           - Patient has a contact number available for                            emergencies. The signs and symptoms of potential                            delayed complications were discussed with the                            patient. Return to normal activities tomorrow.                            Written discharge instructions were provided to the                            patient.                           - Resume previous diet.                           - Continue present medications.                           - Await  pathology results.                           - No repeat upper endoscopy.                           - Return to GI office PRN. Mauri Pole, MD 03/31/2016 9:53:01 AM This report has been signed electronically.

## 2016-03-31 NOTE — Patient Instructions (Signed)
AVOID ALL NSAIDS: GOODY POWDERS, STANBACK, BC POWDERS. EXCEDRIN.                                     ALEVE, NAPROSYN, ADVIL,MOTRIN,IBUPROFEN ETC.     YOU HAD AN ENDOSCOPIC PROCEDURE TODAY AT [facility]:   Refer to the procedure report that was given to you for any specific questions about what was found during the examination.  If the procedure report does not answer your questions, please call your gastroenterologist to clarify.  If you requested that your care partner not be given the details of your procedure findings, then the procedure report has been included in a sealed envelope for you to review at your convenience later.  YOU SHOULD EXPECT: Some feelings of bloating in the abdomen. Passage of more gas than usual.  Walking can help get rid of the air that was put into your GI tract during the procedure and reduce the bloating. If you had a lower endoscopy (such as a colonoscopy or flexible sigmoidoscopy) you may notice spotting of blood in your stool or on the toilet paper. If you underwent a bowel prep for your procedure, you may not have a normal bowel movement for a few days.  Please Note:  You might notice some irritation and congestion in your nose or some drainage.  This is from the oxygen used during your procedure.  There is no need for concern and it should clear up in a day or so.  SYMPTOMS TO REPORT IMMEDIATELY:    Following upper endoscopy (EGD)  Vomiting of blood or coffee ground material  New chest pain or pain under the shoulder blades  Painful or persistently difficult swallowing  New shortness of breath  Fever of 100F or higher  Molenda, tarry-looking stools  For urgent or emergent issues, a gastroenterologist can be reached at any hour by calling [phone].   DIET: Your first meal following the procedure should be a small meal and then it is ok to progress to your normal diet. Heavy or fried foods are harder to digest and may make you feel  nauseous or bloated.  Likewise, meals heavy in dairy and vegetables can increase bloating.  Drink plenty of fluids but you should avoid alcoholic beverages for 24 hours.  ACTIVITY:  You should plan to take it easy for the rest of today and you should NOT DRIVE or use heavy machinery until tomorrow (because of the sedation medicines used during the test).    FOLLOW UP: Our staff will call the number listed on your records the next business day following your procedure to check on you and address any questions or concerns that you may have regarding the information given to you following your procedure. If we do not reach you, we will leave a message.  However, if you are feeling well and you are not experiencing any problems, there is no need to return our call.  We will assume that you have returned to your regular daily activities without incident.  If any biopsies were taken you will be contacted by phone or by letter within the next 1-3 weeks.  Please call us at [phone] if you have not heard about the biopsies in 3 weeks.    SIGNATURES/CONFIDENTIALITY: You and/or your care partner have signed paperwork which will be entered into your electronic medical record.  These signatures attest to the  fact that that the information above on your After Visit Summary has been reviewed and is understood.  Full responsibility of the confidentiality of this discharge information lies with you and/or your care-partner.

## 2016-03-31 NOTE — Progress Notes (Signed)
Patient to restroom.Nurse at patient's side. Complained of dizziness. Patient voided large amount of urine. On standing patient stating she felt faint. Pulse strong. 1035Patient placed in wheelchair back to bedspace. Vital signs stable. Patient given orange juice and peanut butter crackers. Patient much improved, vital sign stable. Dr Silverio Decamp in to discuss findings with patient. 1054 patient stating she feels better and is ready for discharge.

## 2016-03-31 NOTE — Progress Notes (Signed)
Called to room to assist during endoscopic procedure.  Patient ID and intended procedure confirmed with present staff. Received instructions for my participation in the procedure from the performing physician.  

## 2016-03-31 NOTE — Progress Notes (Signed)
0925-Informed Charleston Ropes CRNA that BP running low and pt had lightheadedness once this morning before coming in to Endoscopy.

## 2016-04-01 ENCOUNTER — Telehealth: Payer: Self-pay | Admitting: *Deleted

## 2016-04-01 NOTE — Telephone Encounter (Signed)
  Follow up Call-  Call back number 03/31/2016 09/18/2015  Post procedure Call Back phone  # [phone]  Permission to leave phone message Yes Yes     Patient questions:  Do you have a fever, pain , or abdominal swelling? No. Pain Score  0 *  Have you tolerated food without any problems? Yes.    Have you been able to return to your normal activities? Yes.    Do you have any questions about your discharge instructions: Diet   No. Medications  No. Follow up visit  No.  Do you have questions or concerns about your Care? No.  Actions: * If pain score is 4 or above: No action needed, pain <4.

## 2016-04-07 ENCOUNTER — Encounter: Payer: Self-pay | Admitting: Gastroenterology

## 2016-04-08 ENCOUNTER — Telehealth: Payer: Self-pay | Admitting: *Deleted

## 2016-04-08 NOTE — Telephone Encounter (Signed)
Omeprazole approved for 1 year

## 2016-05-05 LAB — HM HEPATITIS C SCREENING LAB: HM Hepatitis Screen: NEGATIVE

## 2016-05-28 ENCOUNTER — Institutional Professional Consult (permissible substitution): Payer: BLUE CROSS/BLUE SHIELD | Admitting: Pulmonary Disease

## 2016-06-01 ENCOUNTER — Other Ambulatory Visit: Payer: Self-pay | Admitting: Physician Assistant

## 2016-06-01 DIAGNOSIS — Z13820 Encounter for screening for osteoporosis: Secondary | ICD-10-CM

## 2016-06-02 ENCOUNTER — Other Ambulatory Visit: Payer: Self-pay | Admitting: Physician Assistant

## 2016-06-02 DIAGNOSIS — E2839 Other primary ovarian failure: Secondary | ICD-10-CM

## 2016-06-17 ENCOUNTER — Inpatient Hospital Stay: Admission: RE | Admit: 2016-06-17 | Payer: BLUE CROSS/BLUE SHIELD | Source: Ambulatory Visit

## 2016-06-23 ENCOUNTER — Ambulatory Visit
Admission: RE | Admit: 2016-06-23 | Discharge: 2016-06-23 | Disposition: A | Discharge status: Home / Self Care | Payer: BLUE CROSS/BLUE SHIELD | Source: Ambulatory Visit | Attending: Physician Assistant | Admitting: Physician Assistant

## 2016-06-23 DIAGNOSIS — E2839 Other primary ovarian failure: Secondary | ICD-10-CM

## 2016-07-07 ENCOUNTER — Ambulatory Visit (INDEPENDENT_AMBULATORY_CARE_PROVIDER_SITE_OTHER): Payer: BLUE CROSS/BLUE SHIELD | Admitting: Internal Medicine

## 2016-07-07 ENCOUNTER — Other Ambulatory Visit (INDEPENDENT_AMBULATORY_CARE_PROVIDER_SITE_OTHER): Payer: BLUE CROSS/BLUE SHIELD

## 2016-07-07 ENCOUNTER — Encounter: Payer: Self-pay | Admitting: Internal Medicine

## 2016-07-07 VITALS — BP 118/60 | HR 72 | Ht 63.0 in | Wt 119.6 lb

## 2016-07-07 DIAGNOSIS — J449 Chronic obstructive pulmonary disease, unspecified: Secondary | ICD-10-CM | POA: Diagnosis not present

## 2016-07-07 DIAGNOSIS — R918 Other nonspecific abnormal finding of lung field: Secondary | ICD-10-CM

## 2016-07-07 DIAGNOSIS — R0600 Dyspnea, unspecified: Secondary | ICD-10-CM | POA: Diagnosis not present

## 2016-07-07 DIAGNOSIS — F17200 Nicotine dependence, unspecified, uncomplicated: Secondary | ICD-10-CM

## 2016-07-07 LAB — SEDIMENTATION RATE: Sed Rate: 2 mm/hr (ref 0–30)

## 2016-07-07 LAB — CBC WITH DIFFERENTIAL/PLATELET
BASOS PCT: 0.6 % (ref 0.0–3.0)
Basophils Absolute: 0 10*3/uL (ref 0.0–0.1)
EOS PCT: 2.7 % (ref 0.0–5.0)
Eosinophils Absolute: 0.2 10*3/uL (ref 0.0–0.7)
HEMATOCRIT: 39.6 % (ref 36.0–46.0)
HEMOGLOBIN: 13.2 g/dL (ref 12.0–15.0)
LYMPHS PCT: 30.1 % (ref 12.0–46.0)
Lymphs Abs: 2 10*3/uL (ref 0.7–4.0)
MCHC: 33.3 g/dL (ref 30.0–36.0)
MCV: 90.2 fl (ref 78.0–100.0)
MONO ABS: 0.5 10*3/uL (ref 0.1–1.0)
MONOS PCT: 7 % (ref 3.0–12.0)
NEUTROS PCT: 59.6 % (ref 43.0–77.0)
Neutro Abs: 4 10*3/uL (ref 1.4–7.7)
PLATELETS: 262 10*3/uL (ref 150.0–400.0)
RBC: 4.39 Mil/uL (ref 3.87–5.11)
RDW: 14.4 % (ref 11.5–15.5)
WBC: 6.8 10*3/uL (ref 4.0–10.5)

## 2016-07-07 LAB — BASIC METABOLIC PANEL
BUN: 13 mg/dL (ref 6–23)
CHLORIDE: 104 meq/L (ref 96–112)
CO2: 31 meq/L (ref 19–32)
CREATININE: 1 mg/dL (ref 0.40–1.20)
Calcium: 9.1 mg/dL (ref 8.4–10.5)
GFR: 59.17 mL/min — ABNORMAL LOW (ref >60.00)
Glucose, Bld: 106 mg/dL — ABNORMAL HIGH (ref 70–99)
POTASSIUM: 4.7 meq/L (ref 3.5–5.1)
Sodium: 140 mEq/L (ref 135–145)

## 2016-07-07 LAB — TSH: TSH: 1.25 u[IU]/mL (ref 0.35–4.50)

## 2016-07-07 LAB — BRAIN NATRIURETIC PEPTIDE: PRO B NATRI PEPTIDE: 18 pg/mL (ref 0.0–100.0)

## 2016-07-07 NOTE — Assessment & Plan Note (Addendum)
CT chest 628/17 [facility] 3 mm biggest > recall 05/12/17   CT results reviewed with pt >>> Too small for PET or bx, not suspicious enough for excisional bx > really only option for now is follow the Fleischner society guidelines as rec by radiology> one year approp as active smoker

## 2016-07-07 NOTE — Progress Notes (Signed)
Subjective:    Patient ID: Stacy Hansen, female    DOB: 07-01-1951,    MRN: [id number]  HPI  19 yowf active smoker referred to pulmonary clinic 07/07/2016 by Dr Nonda Lou for unexpected wt loss and mpns   07/07/2016 1st [facility] office visit/ Xion Debruyne   Chief Complaint  Patient presents with  . pulmonary consult    per Dr. Melford Aase. last CT 05/12/16 pt c/o sob/dizziness with exertion.  for over a year has noted fatigue, less appetite, depressed related to job, some arthralgias in fingers ankles and knees sym bilaterally  No unusual cough or sputum production  Doe about the same =  MMRC2 = can't walk a nl pace on a flat grade s sob but does fine slow and flat eg  [employer] walking,  Stocking is an issue x one year   proventil no better.   No obvious day to day or daytime variability or assoc excess/ purulent sputum or mucus plugs or hemoptysis or cp or chest tightness, subjective wheeze or overt sinus or hb symptoms. No unusual exp hx or h/o childhood pna/ asthma or knowledge of premature birth.  Sleeping ok without nocturnal  or early am exacerbation  of respiratory  c/o's or need for noct saba. Also denies any obvious fluctuation of symptoms with weather or environmental changes or other aggravating or alleviating factors except as outlined above   Current Medications, Allergies, Complete Past Medical History, Past Surgical History, Family History, and Social History were reviewed in [employer] record.        Review of Systems  Constitutional: Positive for unexpected weight change. Negative for fever.  HENT: Positive for trouble swallowing. Negative for congestion, dental problem, ear pain, nosebleeds, postnasal drip, rhinorrhea, sinus pressure, sneezing and sore throat.   Eyes: Negative for redness and itching.  Respiratory: Positive for shortness of breath. Negative for cough, chest tightness and wheezing.   Cardiovascular: Negative for  palpitations and leg swelling.  Gastrointestinal: Negative for nausea and vomiting.  Genitourinary: Negative for dysuria.  Musculoskeletal: Positive for joint swelling.  Skin: Negative for rash.  Neurological: Negative for headaches.  Hematological: Does not bruise/bleed easily.  Psychiatric/Behavioral: Negative for dysphoric mood. The patient is nervous/anxious.        Objective:   Physical Exam  Wt Readings from Last 3 Encounters:  07/07/16 119 lb 9.6 oz (54.3 kg)  03/31/16 129 lb (58.5 kg)  01/22/16 126 lb 2 oz (57.2 kg)    Vital signs reviewed    HEENT: nl dentition, turbinates, and oropharynx. Nl external ear canals without cough reflex   NECK :  without JVD/Nodes/TM/ nl carotid upstrokes bilaterally   LUNGS: no acc muscle use,  Nl contour chest which is clear to A and P bilaterally without cough on insp or exp maneuvers   CV:  RRR  no s3 or murmur or increase in P2, no edema   ABD:  soft and nontender with nl inspiratory excursion in the supine position. No bruits or organomegaly, bowel sounds nl  MS:  Nl gait/ ext warm without deformities, calf tenderness, cyanosis or clubbing No obvious joint restrictions   SKIN: warm and dry without lesions    NEURO:  alert, approp, nl sensorium with  no motor deficits    I personally reviewed images and agree with radiology impression as follows:  CT Chest  05/12/16 1.Tiny bilateral lung nodules measuring 3 mm or less in size. See below. 2.No definite acute abnormality. 1. Nodules  less than or equal to 19m:No follow up needed if the patient is at low risk for malignancy (risk of malignancy in this category is less than 1%).If patient is at increased risk, 12 month follow up is recommended         Labs ordered/ reviewed:      Chemistry      Component Value Date/Time   NA 140 07/07/2016 1618   K 4.7 07/07/2016 1618   CL 104 07/07/2016 1618   CO2 31 07/07/2016 1618   BUN 13 07/07/2016 1618   CREATININE 1.00  07/07/2016 1618      Component Value Date/Time   CALCIUM 9.1 07/07/2016 1618   ALKPHOS 70 07/10/2015 1142   AST 19 07/10/2015 1142   ALT 14 07/10/2015 1142   BILITOT 0.5 07/10/2015 1142        Lab Results  Component Value Date   WBC 6.8 07/07/2016   HGB 13.2 07/07/2016   HCT 39.6 07/07/2016   MCV 90.2 07/07/2016   PLT 262.0 07/07/2016        Lab Results  Component Value Date   TSH 1.25 07/07/2016     Lab Results  Component Value Date   PROBNP 18.0 07/07/2016       Lab Results  Component Value Date   ESRSEDRATE 2 07/07/2016   ESRSEDRATE 19 07/10/2015   ESRSEDRATE 9 11/21/2013             Assessment & Plan:

## 2016-07-07 NOTE — Patient Instructions (Addendum)
The key is to stop smoking completely before smoking completely stops you!   Please remember to go to the lab department downstairs for your tests - we will call you with the results when they are available.  We contact you in 04/2017 for the follow up CT scan at [facility]   - you should contact me in meantime if breathing or coughing worsen

## 2016-07-08 ENCOUNTER — Telehealth: Payer: Self-pay | Admitting: Internal Medicine

## 2016-07-08 NOTE — Assessment & Plan Note (Signed)

## 2016-07-08 NOTE — Assessment & Plan Note (Signed)
C/w deconditioning > copd/ no evidence of chf/ anemia/ thyroid or kidney dz

## 2016-07-08 NOTE — Telephone Encounter (Signed)
Tanda Rockers, MD  Rosana Berger, [place]        Call patient : Studies are unremarkable, no change in recs  ---  I spoke with patient about results and she verbalized understanding and had no questions.

## 2016-07-08 NOTE — Assessment & Plan Note (Signed)
Spirometry 07/07/2016  wnl though there is mild curvature c/w small airway dz   She is gold 0 since she does not technically meet the criteria yet but is at risk from smoking (see separate a/p)   Saba or saba/sama prn appropriate here > pulmonary f/u prn  Total time devoted to counseling  = 35/6mreview case with pt/ discussion of options/alternatives/ personally creating written instructions  in presence of pt  then going over those specific  Instructions directly with the pt including how to use all of the meds but in particular covering each new medication in detail and the difference between the maintenance/automatic meds and the prns using an action plan format for the latter.

## 2016-07-20 ENCOUNTER — Other Ambulatory Visit (HOSPITAL_COMMUNITY): Payer: Self-pay | Admitting: Interventional Radiology

## 2016-07-20 DIAGNOSIS — I771 Stricture of artery: Secondary | ICD-10-CM

## 2016-07-28 ENCOUNTER — Ambulatory Visit (HOSPITAL_COMMUNITY)
Admission: RE | Admit: 2016-07-28 | Discharge: 2016-07-28 | Disposition: A | Discharge status: Home / Self Care | Payer: BLUE CROSS/BLUE SHIELD | Source: Ambulatory Visit | Attending: Interventional Radiology | Admitting: Interventional Radiology

## 2016-07-28 DIAGNOSIS — I771 Stricture of artery: Secondary | ICD-10-CM

## 2016-07-28 HISTORY — PX: IR GENERIC HISTORICAL: IMG1180011

## 2016-07-29 ENCOUNTER — Institutional Professional Consult (permissible substitution): Payer: BLUE CROSS/BLUE SHIELD | Admitting: Pulmonary Disease

## 2016-07-30 ENCOUNTER — Encounter (HOSPITAL_COMMUNITY): Payer: Self-pay | Admitting: Interventional Radiology

## 2016-08-05 ENCOUNTER — Other Ambulatory Visit (HOSPITAL_COMMUNITY): Payer: Self-pay | Admitting: Interventional Radiology

## 2016-08-05 DIAGNOSIS — H538 Other visual disturbances: Secondary | ICD-10-CM

## 2016-08-05 DIAGNOSIS — R51 Headache: Secondary | ICD-10-CM

## 2016-08-05 DIAGNOSIS — R42 Dizziness and giddiness: Secondary | ICD-10-CM

## 2016-08-05 DIAGNOSIS — R519 Headache, unspecified: Secondary | ICD-10-CM

## 2016-08-18 ENCOUNTER — Ambulatory Visit (HOSPITAL_COMMUNITY)
Admission: RE | Admit: 2016-08-18 | Discharge: 2016-08-18 | Disposition: A | Discharge status: Home / Self Care | Payer: BLUE CROSS/BLUE SHIELD | Source: Ambulatory Visit | Attending: Interventional Radiology | Admitting: Interventional Radiology

## 2016-08-18 DIAGNOSIS — R519 Headache, unspecified: Secondary | ICD-10-CM

## 2016-08-18 DIAGNOSIS — R42 Dizziness and giddiness: Secondary | ICD-10-CM

## 2016-08-18 DIAGNOSIS — R51 Headache: Secondary | ICD-10-CM | POA: Diagnosis not present

## 2016-08-18 DIAGNOSIS — I728 Aneurysm of other specified arteries: Secondary | ICD-10-CM | POA: Diagnosis not present

## 2016-08-18 DIAGNOSIS — H538 Other visual disturbances: Secondary | ICD-10-CM | POA: Diagnosis not present

## 2016-08-18 DIAGNOSIS — Q278 Other specified congenital malformations of peripheral vascular system: Secondary | ICD-10-CM | POA: Diagnosis not present

## 2016-08-18 LAB — CREATININE, SERUM: CREATININE: 0.84 mg/dL (ref 0.44–1.00)

## 2016-08-18 MED ORDER — GADOBENATE DIMEGLUMINE 529 MG/ML IV SOLN
12.0000 mL | Freq: Once | INTRAVENOUS | Status: AC | PRN
  Administered 2016-08-18: 12 mL via INTRAVENOUS

## 2016-08-18 MED ORDER — LORAZEPAM 2 MG/ML IJ SOLN: 0.5000 mg | Freq: Once | INTRAMUSCULAR | Status: AC

## 2016-08-18 MED ORDER — LORAZEPAM 1 MG PO TABS
ORAL_TABLET | ORAL | Status: AC
  Administered 2016-08-18: 0.5 mg via ORAL
  Filled 2016-08-18: qty 1

## 2016-08-18 MED ORDER — LORAZEPAM 0.5 MG PO TABS
0.5000 mg | ORAL_TABLET | Freq: Once | ORAL | Status: AC
  Administered 2016-08-18: 0.5 mg via ORAL

## 2016-08-24 ENCOUNTER — Telehealth (HOSPITAL_COMMUNITY): Payer: Self-pay | Admitting: Radiology

## 2016-08-24 NOTE — Telephone Encounter (Signed)
Returned pt's call, left VM that I would call her back once Dev has reviewed her recent MRI/MRA. jm

## 2016-11-01 ENCOUNTER — Emergency Department (HOSPITAL_COMMUNITY)
Admission: EM | Admit: 2016-11-01 | Discharge: 2016-11-01 | Disposition: A | Discharge status: Home / Self Care | Payer: BLUE CROSS/BLUE SHIELD | Attending: Emergency Medicine | Admitting: Emergency Medicine

## 2016-11-01 ENCOUNTER — Emergency Department (HOSPITAL_COMMUNITY): Payer: BLUE CROSS/BLUE SHIELD

## 2016-11-01 ENCOUNTER — Encounter (HOSPITAL_COMMUNITY): Payer: Self-pay | Admitting: Emergency Medicine

## 2016-11-01 DIAGNOSIS — J449 Chronic obstructive pulmonary disease, unspecified: Secondary | ICD-10-CM | POA: Diagnosis not present

## 2016-11-01 DIAGNOSIS — M79661 Pain in right lower leg: Secondary | ICD-10-CM | POA: Diagnosis present

## 2016-11-01 DIAGNOSIS — Z7982 Long term (current) use of aspirin: Secondary | ICD-10-CM | POA: Diagnosis not present

## 2016-11-01 DIAGNOSIS — Z79899 Other long term (current) drug therapy: Secondary | ICD-10-CM | POA: Insufficient documentation

## 2016-11-01 DIAGNOSIS — F17210 Nicotine dependence, cigarettes, uncomplicated: Secondary | ICD-10-CM | POA: Diagnosis not present

## 2016-11-01 DIAGNOSIS — M79604 Pain in right leg: Secondary | ICD-10-CM | POA: Diagnosis not present

## 2016-11-01 DIAGNOSIS — M7989 Other specified soft tissue disorders: Secondary | ICD-10-CM

## 2016-11-01 LAB — COMPREHENSIVE METABOLIC PANEL
ALT: 12 U/L — ABNORMAL LOW (ref 14–54)
ANION GAP: 6 (ref 5–15)
AST: 22 U/L (ref 15–41)
Albumin: 4.1 g/dL (ref 3.5–5.0)
Alkaline Phosphatase: 61 U/L (ref 38–126)
BUN: 11 mg/dL (ref 6–20)
CHLORIDE: 103 mmol/L (ref 101–111)
CO2: 26 mmol/L (ref 22–32)
Calcium: 9 mg/dL (ref 8.9–10.3)
Creatinine, Ser: 0.76 mg/dL (ref 0.44–1.00)
GFR calc non Af Amer: >60 mL/min (ref >60)
Glucose, Bld: 118 mg/dL — ABNORMAL HIGH (ref 65–99)
Potassium: 3.2 mmol/L — ABNORMAL LOW (ref 3.5–5.1)
SODIUM: 135 mmol/L (ref 135–145)
Total Bilirubin: 0.4 mg/dL (ref 0.3–1.2)
Total Protein: 6.9 g/dL (ref 6.5–8.1)

## 2016-11-01 LAB — CBC WITH DIFFERENTIAL/PLATELET
Basophils Absolute: 0.1 10*3/uL (ref 0.0–0.1)
Basophils Relative: 1 %
EOS ABS: 0.2 10*3/uL (ref 0.0–0.7)
EOS PCT: 2 %
HCT: 39.8 % (ref 36.0–46.0)
Hemoglobin: 13.1 g/dL (ref 12.0–15.0)
LYMPHS ABS: 2.4 10*3/uL (ref 0.7–4.0)
Lymphocytes Relative: 26 %
MCH: 30.8 pg (ref 26.0–34.0)
MCHC: 32.9 g/dL (ref 30.0–36.0)
MCV: 93.6 fL (ref 78.0–100.0)
MONOS PCT: 6 %
Monocytes Absolute: 0.6 10*3/uL (ref 0.1–1.0)
Neutro Abs: 6.1 10*3/uL (ref 1.7–7.7)
Neutrophils Relative %: 65 %
PLATELETS: 299 10*3/uL (ref 150–400)
RBC: 4.25 MIL/uL (ref 3.87–5.11)
RDW: 13.9 % (ref 11.5–15.5)
WBC: 9.2 10*3/uL (ref 4.0–10.5)

## 2016-11-01 MED ORDER — VANCOMYCIN HCL IN DEXTROSE 1-5 GM/200ML-% IV SOLN
1000.0000 mg | Freq: Once | INTRAVENOUS | Status: AC
  Administered 2016-11-01: 1000 mg via INTRAVENOUS
  Filled 2016-11-01: qty 200

## 2016-11-01 MED ORDER — SULFAMETHOXAZOLE-TRIMETHOPRIM 800-160 MG PO TABS: 1.0000 | ORAL_TABLET | Freq: Two times a day (BID) | ORAL | 0 refills | Status: AC

## 2016-11-01 NOTE — Discharge Instructions (Signed)
Follow-up to get your ultrasound of your leg. Follow-up with family doctor later this week to check her leg

## 2016-11-01 NOTE — ED Notes (Signed)
Pt very irate, asking for her lab work and xray, Dr. Roderic Palau informed and paperwork given; pt then told secretary, Mariel Kansky that she did not want paper work, but then stated she did; this RN gave patient paperwork and pt stated "Do I need to sign something to be discharged", this RN stated yes, I will get you to sign; pt then stated "I am not signing anything!"; this RN stated "OK, that's fine" and walked back to nurses' station; pt continued to talk with secretary getting information about the president of the hospital; pt seen leaving ED with spouse and paperwork in hand.

## 2016-11-01 NOTE — ED Notes (Signed)
Attempted to go over discharge paperwork with Stacy Hansen as well as the information needed for the Ultrasound of Stacy Hansen's leg that was scheduled for tomorrow. Stacy Hansen became very upset and stated that " I told that doctor that I could only do the ultrasound on Wednesday or Thursday and you should have came in my room and asked me first". I apologized to the Stacy Hansen and told her that it was no trouble to call Radiology and have her ultrasound rescheduled for a day that was more convenient for her. I then contacted radiology and rescheduled the ultrasound for a date that better suited the Stacy Hansen. Went back in Stacy Hansen's room and went over the new ultrasound date and time as well as instructions and Stacy Hansen asked if I had a copy of her blood work and xray report. I stated that I did not but then explained how she could go under MyChart and obtain her results. Stacy Hansen very angry and not satisfied with this and demanded that the doctor give her a copy of her results. Spoke with Dr Dewayne Hatch regarding pts' requests and he stated that he would print it off for her. In meantime, Stacy Hansen decided that she was not waiting and spoke to the charge nurse regarding her concerns. Stacy Hansen refused to sign stating that she had received her discharge instructions and asked unit secretary "If I do not sign; Do I still have to pay"? Stacy Hansen then obtained Department Director and CEO contact information and left the building accompanied by her husband.

## 2016-11-01 NOTE — ED Notes (Signed)
Greggory Keen, RN [facility] arrived and pt had already left the department

## 2016-11-01 NOTE — ED Triage Notes (Signed)
Pt had toenail [facility] on Thursday in [place] [facility], today develop a knot red area to right leg. PCP sent here.

## 2016-11-01 NOTE — ED Notes (Signed)
Nurse Tech in pt's room to turn down pt's thermostat because it controlled room next door and that room was extremely hot and that pt and family was complaining about the heat. We had previously tried to open the door to that room to help with the heat but it was still hot. Pt asked nurse tech what she was doing and nurse tech explained the situation about the room next door and just asked if we could turn the heat down a little since the pt was being discharged. Pt told nurse tech "no" and stated that it "was her room until she was discharged and that if the pt next door was hot that they could stand out in the hall".

## 2016-11-01 NOTE — ED Provider Notes (Signed)
[facility] DEPT Provider Note   CSN: [id number] Arrival date & time: 11/01/16  1617     History   Chief Complaint Chief Complaint  Patient presents with  . Leg Pain    HPI Stacy Hansen is a 65 y.o. female.  Patient complains of swelling to her right shin with mild redness. She called her doctor who sent her over here for a venous Doppler to rule out DVT   The history is provided by the patient. No language interpreter was used.  Leg Pain   This is a new problem. The current episode started 12 to 24 hours ago. The problem occurs constantly. The problem has not changed since onset.Pain location: Right lower leg. The quality of the pain is described as aching. The pain is at a severity of 3/10. The pain is moderate. Pertinent negatives include no stiffness.    Past Medical History:  Diagnosis Date  . Anxiety   . Atypical chest pain   . Brain aneurysm   . Chronic abdominal pain   . Crohn disease ([place])   . Depression   . GERD (gastroesophageal reflux disease)   . 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 History of fibromyalgia   . Hyperplastic colon polyp   . Inflammatory bowel disease   . Panic attacks   . Stroke ([place])   . Tobacco abuse     Patient Active Problem List   Diagnosis Date Noted  . Multiple pulmonary nodules 07/07/2016  . COPD COPD 0  07/07/2016  . Dyspnea 07/07/2016  . TIA (transient ischemic attack)   . Left sided numbness 11/02/2014  . Tobacco use 11/02/2014  . Bradycardia 11/02/2014  . Brain aneurysm 11/02/2014  . Crohn disease ([place]) 11/02/2014  . Fibromyalgia 11/02/2014  . Left-sided weakness 11/02/2014  . Chronic anxiety 11/02/2014  . CVA (cerebral infarction) 07/10/2013  . Dizziness 05/05/2012  . Anxiety state, unspecified 12/11/2009  . TOBACCO ABUSE 12/11/2009  . Subjective visual disturbance 12/11/2009  . Myalgia and myositis 12/11/2009  . PALPITATIONS 12/11/2009  . Precordial pain 12/11/2009    Past Surgical History:    Procedure Laterality Date  . CATARACT EXTRACTION Bilateral   . IR GENERIC HISTORICAL  07/28/2016   IR RADIOLOGIST EVAL & MGMT 07/28/2016 [facility] RAD  . ORIF ULNAR FRACTURE Left   . ROTATOR CUFF REPAIR Left   . TONSILLECTOMY    . TUBAL LIGATION    . VAGINAL HYSTERECTOMY     has her ovaries  . VARICOSE VEIN SURGERY Right     OB History    No data available       Home Medications    Prior to Admission medications   Medication Sig Start Date End Date Taking? Authorizing Provider  albuterol (PROVENTIL HFA;VENTOLIN HFA) 108 (90 BASE) MCG/ACT inhaler Inhale 2 puffs into the lungs every 6 (six) hours as needed for wheezing or shortness of breath.    Historical Provider, MD  aspirin 325 MG tablet Take 1 tablet (325 mg total) by mouth daily. 11/03/14   Samuella Cota, MD  butalbital-acetaminophen-caffeine (FIORICET, ESGIC) [phone] MG per tablet Take 1 tablet by mouth every 6 (six) hours as needed for headache. 11/21/14   Marcial Pacas, MD  carboxymethylcellulose (REFRESH PLUS) 0.5 % SOLN Apply 1 drop to eye 3 (three) times daily as needed (dry/itching eyes).     Historical Provider, MD  Cholecalciferol (VITAMIN D3) 2000 UNITS capsule Take 5,000 mg by mouth.    Historical Provider,  MD  LORazepam (ATIVAN) 0.5 MG tablet TAKE ONE TABLET BY MOUTH ONCE DAILY AS NEEDED 09/09/15   Historical Provider, MD  omeprazole (PRILOSEC) 40 MG capsule Take 1 capsule (40 mg total) by mouth daily. 03/31/16   Mauri Pole, MD  sulfamethoxazole-trimethoprim (BACTRIM DS,SEPTRA DS) 800-160 MG tablet Take 1 tablet by mouth 2 (two) times daily. 11/01/16 11/08/16  Milton Ferguson, MD  vitamin B-12 (CYANOCOBALAMIN) 1000 MCG tablet Take 1,000 mcg by mouth daily.    Historical Provider, MD    Family History Family History  Problem Relation Age of Onset  . Diabetes Mother   . Kidney failure Mother   . Stroke Father   . Colon cancer Brother     in his 69's  . Diabetes Brother   . Diabetes Sister     x 2     Social History Social History  Substance Use Topics  . Smoking status: Current Every Day Smoker    Packs/day: 0.25    Years: 20.00    Types: Cigarettes  . Smokeless tobacco: Never Used  . Alcohol use No     Allergies   Ciprofloxacin; Meperidine hcl; Opium; Promethazine hcl; and Propoxyphene n-acetaminophen   Review of Systems Review of Systems  Constitutional: Negative for appetite change and fatigue.  HENT: Negative for congestion, ear discharge and sinus pressure.   Eyes: Negative for discharge.  Respiratory: Negative for cough.   Cardiovascular: Negative for chest pain.  Gastrointestinal: Negative for abdominal pain and diarrhea.  Genitourinary: Negative for frequency and hematuria.  Musculoskeletal: Negative for back pain and stiffness.       Leg pain  Skin: Negative for rash.  Neurological: Negative for seizures and headaches.  Psychiatric/Behavioral: Negative for hallucinations.     Physical Exam Updated Vital Signs BP (!) 111/54   Pulse 79   Temp 97.8 F (36.6 C) (Oral)   Resp 15   Ht 5' 3"  (1.6 m)   Wt 118 lb (53.5 kg)   SpO2 100%   BMI 20.90 kg/m   Physical Exam  Constitutional: She is oriented to person, place, and time. She appears well-developed.  HENT:  Head: Normocephalic.  Eyes: Conjunctivae are normal.  Neck: No tracheal deviation present.  Cardiovascular:  No murmur heard. Musculoskeletal: Normal range of motion.  Patient has a 2 cm diameter area of tenderness and redness to the anterior shin. No tenderness to calf neurovascular exams normal.  Neurological: She is oriented to person, place, and time.  Skin: Skin is warm.  Psychiatric: She has a normal mood and affect.     ED Treatments / Results  Labs (all labs ordered are listed, but only abnormal results are displayed) Labs Reviewed  COMPREHENSIVE METABOLIC PANEL - Abnormal; Notable for the following:       Result Value   Potassium 3.2 (*)    Glucose, Bld 118 (*)    ALT 12  (*)    All other components within normal limits  CBC WITH DIFFERENTIAL/PLATELET    EKG  EKG Interpretation None       Radiology Dg Tibia/fibula Right  Result Date: 11/01/2016 CLINICAL DATA:  Patient has redness and pain mid lower leg, on last THursday pt had big toe removal, she returned to work today and developed this redness and pain midshaft EXAM: RIGHT TIBIA AND FIBULA - 2 VIEW COMPARISON:  None. FINDINGS: There is no evidence of fracture or other focal bone lesions. Soft tissues are unremarkable. No subcutaneous emphysema. No radiodense foreign body. IMPRESSION: Negative.  Electronically Signed   By: Lucrezia Europe M.D.   On: 11/01/2016 19:05    Procedures Procedures (including critical care time)  Medications Ordered in ED Medications  vancomycin (VANCOCIN) IVPB 1000 mg/200 mL premix (0 mg Intravenous Stopped 11/01/16 1859)     Initial Impression / Assessment and Plan / ED Course  I have reviewed the triage vital signs and the nursing notes.  Pertinent labs & imaging results that were available during my care of the patient were reviewed by me and considered in my medical decision making (see chart for details).  Clinical Course     Labs and x-rays negative. Patient will be treated for cellulitis of the right lower leg. With Bactrim. She will be scheduled for an ultrasound of her leg as an outpatient. She stated she cannot come back tomorrow morning to get it done. I am not sure when she cut will come back to get the ultrasound done I stressed with her the importance of getting it soon.   Final Clinical Impressions(s) / ED Diagnoses   Final diagnoses:  Right leg pain    New Prescriptions New Prescriptions   SULFAMETHOXAZOLE-TRIMETHOPRIM (BACTRIM DS,SEPTRA DS) 800-160 MG TABLET    Take 1 tablet by mouth 2 (two) times daily.     Milton Ferguson, MD 11/01/16 [phone]

## 2016-11-02 ENCOUNTER — Inpatient Hospital Stay (HOSPITAL_COMMUNITY): Admit: 2016-11-02 | Payer: BLUE CROSS/BLUE SHIELD

## 2016-11-03 ENCOUNTER — Ambulatory Visit (HOSPITAL_COMMUNITY): Admit: 2016-11-03 | Payer: Medicare Other

## 2016-12-20 ENCOUNTER — Other Ambulatory Visit (HOSPITAL_COMMUNITY): Payer: Self-pay | Admitting: Family Medicine

## 2016-12-20 DIAGNOSIS — E2839 Other primary ovarian failure: Secondary | ICD-10-CM

## 2016-12-20 DIAGNOSIS — Z1231 Encounter for screening mammogram for malignant neoplasm of breast: Secondary | ICD-10-CM

## 2016-12-27 ENCOUNTER — Other Ambulatory Visit (HOSPITAL_COMMUNITY): Payer: BLUE CROSS/BLUE SHIELD

## 2016-12-27 ENCOUNTER — Ambulatory Visit (HOSPITAL_COMMUNITY): Payer: BLUE CROSS/BLUE SHIELD

## 2016-12-30 ENCOUNTER — Ambulatory Visit (HOSPITAL_COMMUNITY)
Admission: RE | Admit: 2016-12-30 | Discharge: 2016-12-30 | Disposition: A | Discharge status: Home / Self Care | Payer: BLUE CROSS/BLUE SHIELD | Source: Ambulatory Visit | Attending: Family Medicine | Admitting: Family Medicine

## 2016-12-30 DIAGNOSIS — M8588 Other specified disorders of bone density and structure, other site: Secondary | ICD-10-CM | POA: Diagnosis not present

## 2016-12-30 DIAGNOSIS — E2839 Other primary ovarian failure: Secondary | ICD-10-CM | POA: Insufficient documentation

## 2016-12-30 DIAGNOSIS — Z1231 Encounter for screening mammogram for malignant neoplasm of breast: Secondary | ICD-10-CM | POA: Insufficient documentation

## 2017-03-02 ENCOUNTER — Ambulatory Visit: Payer: BLUE CROSS/BLUE SHIELD | Admitting: Internal Medicine

## 2017-03-03 ENCOUNTER — Encounter: Payer: Self-pay | Admitting: Gastroenterology

## 2017-03-30 ENCOUNTER — Ambulatory Visit (INDEPENDENT_AMBULATORY_CARE_PROVIDER_SITE_OTHER): Payer: BLUE CROSS/BLUE SHIELD | Admitting: Pulmonary Disease

## 2017-03-30 ENCOUNTER — Encounter: Payer: Self-pay | Admitting: Pulmonary Disease

## 2017-03-30 VITALS — BP 120/72 | HR 79 | Ht 63.0 in | Wt 113.8 lb

## 2017-03-30 DIAGNOSIS — F17200 Nicotine dependence, unspecified, uncomplicated: Secondary | ICD-10-CM

## 2017-03-30 DIAGNOSIS — R918 Other nonspecific abnormal finding of lung field: Secondary | ICD-10-CM

## 2017-03-30 DIAGNOSIS — R0609 Other forms of dyspnea: Secondary | ICD-10-CM

## 2017-03-30 NOTE — Progress Notes (Signed)
Subjective:    Patient ID: Stacy Hansen, female    DOB: 08-Jan-1951, 66 y.o.   MRN: [id number]  C.C.:  Follow-up for Dyspnea on Exertion, Multiple Lung Nodules, & Tobacco Use Disorder.   HPI  Dyspnea on Exertion:  She reports an intermittent cough that is nonproductive. Does have dyspnea on exertion that is progressively worsening with the heat & high pollen counts. She does wheeze intermittently. She reports she rarely use her rescue inhaler because of tremors. She feels it may help her breathing complaints.   Multiple Lung Nodules:  Seen on June 2017. Has not had repeat imaging yet. Imaging not available for review.   Tobacco Use Disorder:  She is smoking 3-4 cigarettes daily. She admits she only smokes at work.   Review of Systems No chest pain or pressure. She does have occasional fever & chills. No abdominal pain or nasuea.   Allergies  Allergen Reactions  . Ciprofloxacin Other (See Comments)    Unknown reaction per pt  . Meperidine Hcl Nausea And Vomiting and Other (See Comments)    Causes blood pressure to drop when taken with Phenergan through IV  . Opium Other (See Comments)    Causes blood pressure to drop when taken with Phenergan through IV  . Promethazine Hcl Nausea And Vomiting and Other (See Comments)    Causes blood pressure to drop when taken with Demeral via IV  . Propoxyphene N-Acetaminophen Other (See Comments)    "pass out"    Current Outpatient Prescriptions on File Prior to Visit  Medication Sig Dispense Refill  . albuterol (PROVENTIL HFA;VENTOLIN HFA) 108 (90 BASE) MCG/ACT inhaler Inhale 2 puffs into the lungs every 6 (six) hours as needed for wheezing or shortness of breath.    Marland Kitchen aspirin 325 MG tablet Take 1 tablet (325 mg total) by mouth daily.    . butalbital-acetaminophen-caffeine (FIORICET, ESGIC) 50-325-40 MG per tablet Take 1 tablet by mouth every 6 (six) hours as needed for headache. 10 tablet 3  . carboxymethylcellulose (REFRESH PLUS) 0.5 % SOLN  Apply 1 drop to eye 3 (three) times daily as needed (dry/itching eyes).     . Cholecalciferol (VITAMIN D3) 2000 UNITS capsule Take 5,000 mg by mouth.    Marland Kitchen LORazepam (ATIVAN) 0.5 MG tablet TAKE ONE TABLET BY MOUTH ONCE DAILY AS NEEDED    . omeprazole (PRILOSEC) 40 MG capsule Take 1 capsule (40 mg total) by mouth daily. 30 capsule 11  . vitamin B-12 (CYANOCOBALAMIN) 1000 MCG tablet Take 1,000 mcg by mouth daily.     No current facility-administered medications on file prior to visit.     Past Medical History:  Diagnosis Date  . Anxiety   . Atypical chest pain   . Brain aneurysm   . Chronic abdominal pain   . Crohn disease ([place])   . Depression   . GERD (gastroesophageal reflux disease)   . 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 History of fibromyalgia   . Hyperplastic colon polyp   . Inflammatory bowel disease   . Panic attacks   . Stroke ([place])   . Tobacco abuse     Past Surgical History:  Procedure Laterality Date  . CATARACT EXTRACTION Bilateral   . IR GENERIC HISTORICAL  07/28/2016   IR RADIOLOGIST EVAL & MGMT 07/28/2016 [facility]  . ORIF ULNAR FRACTURE Left   . ROTATOR CUFF REPAIR Left   . TONSILLECTOMY    . TUBAL LIGATION    .  VAGINAL HYSTERECTOMY     has her ovaries  . VARICOSE VEIN SURGERY Right     Family History  Problem Relation Age of Onset  . Diabetes Mother   . Kidney failure Mother   . Stroke Father   . Colon cancer Brother        in his 54's  . Diabetes Brother   . Diabetes Sister        x 2    Social History   Social History  . Marital status: Married    Spouse name: N/A  . Number of children: 2  . Years of education: N/A   Occupational History  . [employer] Field seismologist   Social History Main Topics  . Smoking status: Current Every Day Smoker    Packs/day: 0.25    Years: 20.00    Types: Cigarettes  . Smokeless tobacco: Never Used  . Alcohol use No  . Drug use: No  . Sexual activity: Yes    Birth control/  protection: None   Other Topics Concern  . None   Social History Narrative  . None      Objective:   Physical Exam BP 120/72 (BP Location: Right Arm, Patient Position: Sitting, Cuff Size: Normal)   Pulse 79   Ht 5' 3"  (1.6 m)   Wt 113 lb 12.8 oz (51.6 kg)   SpO2 97%   BMI 20.16 kg/m  General:  Awake. Alert. No acute distress.  Integument:  Warm & dry. No rash on exposed skin. N Extremities:  No cyanosis or clubbing.  HEENT:  Moist mucus membranes. No oral ulcers. No scleral injection or icterus. Minimal nasal turbinate swelling. Cardiovascular:  Regular rate. No edema. Regular rhythm. Pulmonary:  Good aeration & clear to auscultation bilaterally. Symmetric chest wall expansion. No accessory muscle use on room air. Neurological: Cranial nerves grossly intact. No meningismus. Oriented 4.  PFT 07/07/16: FVC 2.61 L (85%) FEV1 1.89 L (81%) FEV1/FVC 0.72 FEF 25-75 1.36 L (64%)  POLYSOMNOGRAM (05/15/14): Increased upper airway resistance without definite sleep apnea. Reduced sleep efficiency. Minimal oxygen desaturation related to respiratory events. Lowest saturation 90%. Heavy snoring. Nocturnal myoclonus with questionable clinical significance. Abnormal arousal index. Diagnosed with periodic limb movement disorder.  IMAGING  CT CHEST 05/12/16 (per office note/documentation):  Tiny bilateral lung nodules 78m in size or less. No definite acute abnormality.     Assessment & Plan:  66y.o. female with a progressive history of dyspnea on exertion. When coupled with her ongoing tobacco use disorder I do question whether or not she may have underlying daily. Her previous spirometry did not show any evidence of airway obstruction that would be consistent with COPD despite previous diagnosis. Hypoxia is a second concern and we will need to evaluate her underlying lung parenchymal tissue given her reported history of multiple lung nodules as well as ongoing tobacco use. I instructed the patient to  contact my office if she had any new breathing problems or questions before her next appointment.  1. Dyspnea on Exertion: Checking full pulmonary function testing and 6 minute walk test on room air on or before next appointment. 2. Multiple Lung Nodules: Checking CT chest without contrast. 3. Tobacco Use Disorder: Patient counseled for over 3 minutes on the need for complete tobacco cessation. 4. Health maintenance: Status post Pneumovax October 2016. 5. Follow-up:  Return to clinic 6 weeks or sooner if needed.  JSonia BallerNAshok Cordia M.D. [facility] Pager:  [phone] 3pm  or if no response, call [phone] 10:45 AM 03/30/17

## 2017-03-30 NOTE — Patient Instructions (Signed)
   Call me if you have any breathing problems or questions before your next appointment.  Check with the DME company about new head gear for your husband's CPAP.  I will see you back after your testing is complete. Call me if you have any questions.  TESTS ORDERED: 1. CT CHEST W/O 2. FULL PFTs before next appointment 3. 6MWT on room air on or before next appointment

## 2017-04-07 ENCOUNTER — Telehealth: Payer: Self-pay | Admitting: *Deleted

## 2017-04-07 ENCOUNTER — Ambulatory Visit (INDEPENDENT_AMBULATORY_CARE_PROVIDER_SITE_OTHER)
Admission: RE | Admit: 2017-04-07 | Discharge: 2017-04-07 | Disposition: A | Discharge status: Home / Self Care | Payer: BLUE CROSS/BLUE SHIELD | Source: Ambulatory Visit | Attending: Pulmonary Disease | Admitting: Pulmonary Disease

## 2017-04-07 DIAGNOSIS — R918 Other nonspecific abnormal finding of lung field: Secondary | ICD-10-CM

## 2017-04-07 NOTE — Telephone Encounter (Signed)
Entered in error. [facility] called the wrong office for their request

## 2017-04-15 NOTE — Progress Notes (Signed)
Spoke with patient and informed her of results. Pt questioned if it was something to be concerned about and is it cancerous as she can't wait until next appointment to review it further. JN please advise.

## 2017-04-18 NOTE — Progress Notes (Signed)
Spoke with patient and informed her of JN message. She did not have any questions and verbalized understanding. Nothing further is needed.

## 2017-04-21 ENCOUNTER — Emergency Department (HOSPITAL_COMMUNITY): Payer: BLUE CROSS/BLUE SHIELD

## 2017-04-21 ENCOUNTER — Encounter (HOSPITAL_COMMUNITY): Payer: Self-pay | Admitting: Emergency Medicine

## 2017-04-21 ENCOUNTER — Emergency Department (HOSPITAL_COMMUNITY)
Admission: EM | Admit: 2017-04-21 | Discharge: 2017-04-21 | Disposition: A | Discharge status: Home / Self Care | Payer: BLUE CROSS/BLUE SHIELD | Attending: Emergency Medicine | Admitting: Emergency Medicine

## 2017-04-21 DIAGNOSIS — F17210 Nicotine dependence, cigarettes, uncomplicated: Secondary | ICD-10-CM | POA: Diagnosis not present

## 2017-04-21 DIAGNOSIS — Z7982 Long term (current) use of aspirin: Secondary | ICD-10-CM | POA: Diagnosis not present

## 2017-04-21 DIAGNOSIS — I69398 Other sequelae of cerebral infarction: Secondary | ICD-10-CM | POA: Diagnosis not present

## 2017-04-21 DIAGNOSIS — M436 Torticollis: Secondary | ICD-10-CM | POA: Insufficient documentation

## 2017-04-21 DIAGNOSIS — K5090 Crohn's disease, unspecified, without complications: Secondary | ICD-10-CM | POA: Diagnosis not present

## 2017-04-21 DIAGNOSIS — I6900 Unspecified sequelae of nontraumatic subarachnoid hemorrhage: Secondary | ICD-10-CM | POA: Insufficient documentation

## 2017-04-21 DIAGNOSIS — R519 Headache, unspecified: Secondary | ICD-10-CM

## 2017-04-21 DIAGNOSIS — R51 Headache: Secondary | ICD-10-CM | POA: Insufficient documentation

## 2017-04-21 DIAGNOSIS — M542 Cervicalgia: Secondary | ICD-10-CM | POA: Insufficient documentation

## 2017-04-21 DIAGNOSIS — R110 Nausea: Secondary | ICD-10-CM | POA: Insufficient documentation

## 2017-04-21 DIAGNOSIS — Z79899 Other long term (current) drug therapy: Secondary | ICD-10-CM | POA: Diagnosis not present

## 2017-04-21 LAB — PROTIME-INR
INR: 0.92
PROTHROMBIN TIME: 12.4 s (ref 11.4–15.2)

## 2017-04-21 LAB — CBC WITH DIFFERENTIAL/PLATELET
BASOS ABS: 0 10*3/uL (ref 0.0–0.1)
Basophils Relative: 1 %
Eosinophils Absolute: 0.2 10*3/uL (ref 0.0–0.7)
Eosinophils Relative: 2 %
HEMATOCRIT: 39.7 % (ref 36.0–46.0)
Hemoglobin: 12.9 g/dL (ref 12.0–15.0)
LYMPHS PCT: 22 %
Lymphs Abs: 1.4 10*3/uL (ref 0.7–4.0)
MCH: 29.9 pg (ref 26.0–34.0)
MCHC: 32.5 g/dL (ref 30.0–36.0)
MCV: 92.1 fL (ref 78.0–100.0)
Monocytes Absolute: 0.5 10*3/uL (ref 0.1–1.0)
Monocytes Relative: 7 %
NEUTROS ABS: 4.2 10*3/uL (ref 1.7–7.7)
NEUTROS PCT: 68 %
PLATELETS: 220 10*3/uL (ref 150–400)
RBC: 4.31 MIL/uL (ref 3.87–5.11)
RDW: 13.9 % (ref 11.5–15.5)
WBC: 6.2 10*3/uL (ref 4.0–10.5)

## 2017-04-21 LAB — COMPREHENSIVE METABOLIC PANEL
ALT: 15 U/L (ref 14–54)
AST: 25 U/L (ref 15–41)
Albumin: 3.7 g/dL (ref 3.5–5.0)
Alkaline Phosphatase: 58 U/L (ref 38–126)
Anion gap: 9 (ref 5–15)
BILIRUBIN TOTAL: 0.7 mg/dL (ref 0.3–1.2)
BUN: 7 mg/dL (ref 6–20)
CHLORIDE: 106 mmol/L (ref 101–111)
CO2: 24 mmol/L (ref 22–32)
CREATININE: 0.76 mg/dL (ref 0.44–1.00)
Calcium: 8.8 mg/dL — ABNORMAL LOW (ref 8.9–10.3)
GFR calc Af Amer: >60 mL/min (ref >60)
Glucose, Bld: 106 mg/dL — ABNORMAL HIGH (ref 65–99)
Potassium: 4.1 mmol/L (ref 3.5–5.1)
Sodium: 139 mmol/L (ref 135–145)
Total Protein: 6.1 g/dL — ABNORMAL LOW (ref 6.5–8.1)

## 2017-04-21 LAB — I-STAT CHEM 8, ED
BUN: 6 mg/dL (ref 6–20)
Calcium, Ion: 1.08 mmol/L — ABNORMAL LOW (ref 1.15–1.40)
Chloride: 108 mmol/L (ref 101–111)
Creatinine, Ser: 0.7 mg/dL (ref 0.44–1.00)
Glucose, Bld: 106 mg/dL — ABNORMAL HIGH (ref 65–99)
HCT: 39 % (ref 36.0–46.0)
HEMOGLOBIN: 13.3 g/dL (ref 12.0–15.0)
POTASSIUM: 3.9 mmol/L (ref 3.5–5.1)
Sodium: 141 mmol/L (ref 135–145)
TCO2: 24 mmol/L (ref 0–100)

## 2017-04-21 MED ORDER — SODIUM CHLORIDE 0.9 % IV BOLUS (SEPSIS)
1000.0000 mL | Freq: Once | INTRAVENOUS | Status: AC
  Administered 2017-04-21: 1000 mL via INTRAVENOUS

## 2017-04-21 MED ORDER — DEXAMETHASONE SODIUM PHOSPHATE 10 MG/ML IJ SOLN
10.0000 mg | Freq: Once | INTRAMUSCULAR | Status: AC
  Administered 2017-04-21: 10 mg via INTRAVENOUS
  Filled 2017-04-21: qty 1

## 2017-04-21 MED ORDER — IOPAMIDOL (ISOVUE-370) INJECTION 76%
INTRAVENOUS | Status: AC
  Administered 2017-04-21: 50 mL
  Filled 2017-04-21: qty 50

## 2017-04-21 MED ORDER — CYCLOBENZAPRINE HCL 5 MG PO TABS: 5.0000 mg | ORAL_TABLET | Freq: Two times a day (BID) | ORAL | 0 refills | Status: DC | PRN

## 2017-04-21 MED ORDER — PROCHLORPERAZINE EDISYLATE 5 MG/ML IJ SOLN
10.0000 mg | Freq: Once | INTRAMUSCULAR | Status: AC
  Administered 2017-04-21: 10 mg via INTRAVENOUS
  Filled 2017-04-21: qty 2

## 2017-04-21 MED ORDER — DIPHENHYDRAMINE HCL 50 MG/ML IJ SOLN
25.0000 mg | Freq: Once | INTRAMUSCULAR | Status: AC
  Administered 2017-04-21: 25 mg via INTRAVENOUS
  Filled 2017-04-21: qty 1

## 2017-04-21 MED ORDER — LORAZEPAM 2 MG/ML IJ SOLN: 1.0000 mg | Freq: Once | INTRAMUSCULAR | Status: DC

## 2017-04-21 NOTE — ED Triage Notes (Addendum)
Pt presents to ER from home with [place] for headache x 2 hr that awoke her from sleep; worse ever L  Sided head and neck pain with nausea, no vomiting; pt also endorses vision changes; pt reports hx of CVA x 2 and brain aneurysm with Dr. Estanislado Pandy;

## 2017-04-21 NOTE — ED Notes (Signed)
Patient transported to CT 

## 2017-04-21 NOTE — ED Notes (Signed)
Pt refusing ativan due to concern it will make her too sleepy. Also, she has this at home and it hasn't helped. Dr. teagler made aware, he will come talk with patient and husband.

## 2017-04-21 NOTE — ED Notes (Signed)
Dr tegeler at bedside to update pt

## 2017-04-21 NOTE — Discharge Instructions (Signed)
Please take the muscle relaxants to help with her neck muscle spasms. Please stay hydrated. Please continue taking her home medications. Please schedule a follow-up appointment with your PCP in the next several days. If any symptoms change or worsen, please return to the nearest emergency department.

## 2017-04-21 NOTE — ED Provider Notes (Signed)
[facility] DEPT Provider Note   CSN: [id number] Arrival date & time: 04/21/17  3354     History   Chief Complaint Chief Complaint  Patient presents with  . Headache  . Torticollis    HPI ELYZA WHITT is a 66 y.o. female.  The history is provided by the patient and medical records.  Headache   This is a new problem. The current episode started 3 to 5 hours ago. The problem occurs constantly. The problem has not changed since onset.The headache is associated with bright light. The pain is located in the occipital region. The quality of the pain is described as sharp and dull. The pain is severe. The pain does not radiate. Associated symptoms include nausea. Pertinent negatives include no anorexia, no fever, no malaise/fatigue, no chest pressure, no near-syncope, no syncope, no shortness of breath and no vomiting. She has tried nothing for the symptoms. The treatment provided no relief.    Past Medical History:  Diagnosis Date  . Anxiety   . Atypical chest pain   . Brain aneurysm   . Chronic abdominal pain   . Crohn disease ([place])   . Depression   . GERD (gastroesophageal reflux disease)   . 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 History of fibromyalgia   . Hyperplastic colon polyp   . Inflammatory bowel disease   . Panic attacks   . Stroke ([place])   . Tobacco abuse     Patient Active Problem List   Diagnosis Date Noted  . Multiple pulmonary nodules 07/07/2016  . Dyspnea 07/07/2016  . TIA (transient ischemic attack)   . Left sided numbness 11/02/2014  . Tobacco use 11/02/2014  . Bradycardia 11/02/2014  . Brain aneurysm 11/02/2014  . Crohn disease ([place]) 11/02/2014  . Fibromyalgia 11/02/2014  . Left-sided weakness 11/02/2014  . Chronic anxiety 11/02/2014  . CVA (cerebral infarction) 07/10/2013  . Dizziness 05/05/2012  . Anxiety state, unspecified 12/11/2009  . TOBACCO ABUSE 12/11/2009  . Subjective visual disturbance 12/11/2009  . Myalgia and myositis  12/11/2009  . PALPITATIONS 12/11/2009  . Precordial pain 12/11/2009    Past Surgical History:  Procedure Laterality Date  . CATARACT EXTRACTION Bilateral   . IR GENERIC HISTORICAL  07/28/2016   IR RADIOLOGIST EVAL & MGMT 07/28/2016 [facility]INTER[facility] RAD  . ORIF ULNAR FRACTURE Left   . ROTATOR CUFF REPAIR Left   . TONSILLECTOMY    . TUBAL LIGATION    . VAGINAL HYSTERECTOMY     has her ovaries  . VARICOSE VEIN SURGERY Right     OB History    No data available       Home Medications    Prior to Admission medications   Medication Sig Start Date End Date Taking? Authorizing Provider  albuterol (PROVENTIL HFA;VENTOLIN HFA) 108 (90 BASE) MCG/ACT inhaler Inhale 2 puffs into the lungs every 6 (six) hours as needed for wheezing or shortness of breath.    [provider]  aspirin 325 MG tablet Take 1 tablet (325 mg total) by mouth daily. 11/03/14   Samuella Cota, MD  butalbital-acetaminophen-caffeine (FIORICET, ESGIC) [phone] MG per tablet Take 1 tablet by mouth every 6 (six) hours as needed for headache. 11/21/14   Marcial Pacas, MD  carboxymethylcellulose (REFRESH PLUS) 0.5 % SOLN Apply 1 drop to eye 3 (three) times daily as needed (dry/itching eyes).     [provider]  Cholecalciferol (VITAMIN D3) 2000 UNITS capsule Take 5,000 mg by mouth.  [provider]  LORazepam (ATIVAN) 0.5 MG tablet TAKE ONE TABLET BY MOUTH ONCE DAILY AS NEEDED 09/09/15   [provider]  omeprazole (PRILOSEC) 40 MG capsule Take 1 capsule (40 mg total) by mouth daily. 03/31/16   Mauri Pole, MD  vitamin B-12 (CYANOCOBALAMIN) 1000 MCG tablet Take 1,000 mcg by mouth daily.    [provider]    Family History Family History  Problem Relation Age of Onset  . Diabetes Mother   . Kidney failure Mother   . Stroke Father   . Colon cancer Brother        in his 9's  . Diabetes Brother   . Diabetes Sister        x 2    Social History Social History  Substance  Use Topics  . Smoking status: Current Every Day Smoker    Packs/day: 0.25    Years: 20.00    Types: Cigarettes  . Smokeless tobacco: Never Used  . Alcohol use No     Allergies   Ciprofloxacin; Meperidine hcl; Opium; Promethazine hcl; and Propoxyphene n-acetaminophen   Review of Systems Review of Systems  Constitutional: Negative for chills, diaphoresis, fatigue, fever and malaise/fatigue.  HENT: Negative for congestion and rhinorrhea.   Eyes: Positive for photophobia and visual disturbance.  Respiratory: Negative for chest tightness, shortness of breath, wheezing and stridor.   Cardiovascular: Negative for chest pain, leg swelling, syncope and near-syncope.  Gastrointestinal: Positive for nausea. Negative for abdominal pain, anorexia, constipation, diarrhea and vomiting.  Genitourinary: Negative for dysuria and frequency.  Musculoskeletal: Positive for neck pain. Negative for back pain.  Skin: Negative for rash and wound.  Neurological: Positive for numbness and headaches. Negative for dizziness, seizures, facial asymmetry and weakness.  All other systems reviewed and are negative.    Physical Exam Updated Vital Signs BP 119/66 (BP Location: Right Arm)   Pulse 70   Temp 97.7 F (36.5 C) (Oral)   Resp 14   SpO2 98%   Physical Exam  Constitutional: She is oriented to person, place, and time. She appears well-developed and well-nourished. No distress.  HENT:  Head: Normocephalic and atraumatic.    Nose: Nose normal.  Mouth/Throat: Oropharynx is clear and moist. No oropharyngeal exudate.  Eyes: Conjunctivae and EOM are normal. Pupils are equal, round, and reactive to light.  Neck: Neck supple. Muscular tenderness present. No edema and no erythema present.    Cardiovascular: Normal rate, regular rhythm and intact distal pulses.   No murmur heard. Pulmonary/Chest: Effort normal and breath sounds normal. No stridor. No respiratory distress. She has no wheezes. She  exhibits no tenderness.  Abdominal: Soft. There is no tenderness.  Musculoskeletal: She exhibits no edema or tenderness.  Neurological: She is alert and oriented to person, place, and time. She is not disoriented. She displays no tremor. No sensory deficit. She exhibits normal muscle tone. Coordination normal. GCS eye subscore is 4. GCS verbal subscore is 5. GCS motor subscore is 6.  Left facial numbness, mild blurry vision.   Skin: Skin is warm and dry. Capillary refill takes less than 2 seconds. No rash noted. She is not diaphoretic. No erythema.  Psychiatric: She has a normal mood and affect.  Nursing note and vitals reviewed.    ED Treatments / Results  Labs (all labs ordered are listed, but only abnormal results are displayed) Labs Reviewed  COMPREHENSIVE METABOLIC PANEL - Abnormal; Notable for the following:       Result Value  Glucose, Bld 106 (*)    Calcium 8.8 (*)    Total Protein 6.1 (*)    All other components within normal limits  I-STAT CHEM 8, ED - Abnormal; Notable for the following:    Glucose, Bld 106 (*)    Calcium, Ion 1.08 (*)    All other components within normal limits  CBC WITH DIFFERENTIAL/PLATELET  PROTIME-INR    EKG  EKG Interpretation None       Radiology Ct Angio Head W Or Wo Contrast  Result Date: 04/21/2017 CLINICAL DATA:  66 year old female with left side neck, posterior head pain nausea and blurred vision since 0300 hours. Persistent right trigeminal artery. EXAM: CT ANGIOGRAPHY HEAD AND NECK TECHNIQUE: Multidetector CT imaging of the head and neck was performed using the standard protocol during bolus administration of intravenous contrast. Multiplanar CT image reconstructions and MIPs were obtained to evaluate the vascular anatomy. Carotid stenosis measurements (when applicable) are obtained utilizing NASCET criteria, using the distal internal carotid diameter as the denominator. CONTRAST:  50 mL Isovue 370 COMPARISON:  Brain MRI and  intracranial MRA 08/18/2016 and earlier including 03/14/2005. CTA head and neck 11/02/2014. FINDINGS: CT HEAD Brain: Cerebral volume is stable since 2015 and normal for age. No midline shift, ventriculomegaly, mass effect, evidence of mass lesion, intracranial hemorrhage or evidence of cortically based acute infarction. Gray-white matter differentiation is within normal limits throughout the brain. Calvarium and skull base: No acute osseous abnormality identified. Paranasal sinuses: Visualized paranasal sinuses and mastoids are stable and well pneumatized. Orbits: No acute orbit or scalp soft tissue findings. CTA NECK Skeleton: Mild cervicothoracic scoliosis. No spine degeneration. Absent dentition. No acute osseous abnormality identified. Upper chest: No superior mediastinal lymphadenopathy. Mild left greater than right apical lung scarring is stable. Other neck: Negative.  No lymphadenopathy. 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 No arch atherosclerosis or great vessel origin stenosis. Right carotid system: Negative. Left carotid system: Negative. Vertebral arteries: Normal right vertebral artery origin from the aberrant right subclavian artery. Non dominant right vertebral artery is diminutive but patent to the skullbase without stenosis. Normal left vertebral artery origin. Mildly dominant but still somewhat diminutive left vertebral artery is patent to the skullbase without stenosis. CTA HEAD Posterior circulation: Patent but diminutive distal vertebral arteries, the left supplies the basilar and the right terminates in PICA. Left PICA is patent. Diminutive, thread-like basilar artery beyond the left AICA origin is then supplied distally by a persistent right trigeminal artery, unusual normal anatomic variation. Mild fusiform enlargement of the right trigeminal artery measuring 3 mm appears stable  since 2006 (compare series 16, image 12 today to series 405, image 19 in 2006). Distal to the trigeminal artery the basilar has a more normal size, but there is a superimposed fetal right PCA origin. Normal SCA origins. The left PCA origin is also fetal type although a left P1 is visible. Bilateral PCA branches are normal. Anterior circulation: Both ICA siphons remain patent without atherosclerosis or stenosis. Mild to moderate cavernous segment tortuosity is again noted. Bilateral ophthalmic and posterior communicating artery origins are normal. Carotid termini are stable and normal. MCA and ACA origins are normal. Anterior communicating artery and bilateral ACA branches are within normal limits. Left MCA M1 segment, bifurcation, and left MCA branches are stable and within normal limits. Right MCA M1 segment, bifurcation, and right MCA branches are stable and within normal limits. Venous sinuses:  Patent. Dominant right transverse and sigmoid sinuses. Anatomic variants: Aberrant right subclavian artery origin. Shared origin of the common carotid arteries. Dominant left vertebral artery, the right terminates in PICA. Diminutive vertebrobasilar system augmented by persistent right side trigeminal artery. Fetal PCA origins, more so the right. Delayed phase: No abnormal enhancement identified. Review of the MIP images confirms the above findings IMPRESSION: 1. Stable CTA head and neck since 2015. No atherosclerosis or stenosis. Mild anterior circulation tortuosity. 2. 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 But note that this vessel appears unchanged in size and configuration since 2006, such that this "fusiform aneurysm" is of doubtful significance. 3. Stable and normal for age CT appearance of the brain. 4. No acute findings in the neck. Electronically Signed   By: Genevie Ann M.D.   On: 04/21/2017 08:41   Ct Angio Neck W And/or  Wo Contrast  Result Date: 04/21/2017 CLINICAL DATA:  66 year old female with left side neck, posterior head pain nausea and blurred vision since 0300 hours. Persistent right trigeminal artery. EXAM: CT ANGIOGRAPHY HEAD AND NECK TECHNIQUE: Multidetector CT imaging of the head and neck was performed using the standard protocol during bolus administration of intravenous contrast. Multiplanar CT image reconstructions and MIPs were obtained to evaluate the vascular anatomy. Carotid stenosis measurements (when applicable) are obtained utilizing NASCET criteria, using the distal internal carotid diameter as the denominator. CONTRAST:  50 mL Isovue 370 COMPARISON:  Brain MRI and intracranial MRA 08/18/2016 and earlier including 03/14/2005. CTA head and neck 11/02/2014. FINDINGS: CT HEAD Brain: Cerebral volume is stable since 2015 and normal for age. No midline shift, ventriculomegaly, mass effect, evidence of mass lesion, intracranial hemorrhage or evidence of cortically based acute infarction. Gray-white matter differentiation is within normal limits throughout the brain. Calvarium and skull base: No acute osseous abnormality identified. Paranasal sinuses: Visualized paranasal sinuses and mastoids are stable and well pneumatized. Orbits: No acute orbit or scalp soft tissue findings. CTA NECK Skeleton: Mild cervicothoracic scoliosis. No spine degeneration. Absent dentition. No acute osseous abnormality identified. Upper chest: No superior mediastinal lymphadenopathy. Mild left greater than right apical lung scarring is stable. Other neck: Negative.  No lymphadenopathy. 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 No arch atherosclerosis or great vessel origin stenosis. Right carotid system: Negative. Left carotid system: Negative. Vertebral arteries: Normal right vertebral artery origin from the aberrant right subclavian  artery. Non dominant right vertebral artery is diminutive but patent to the skullbase without stenosis. Normal left vertebral artery origin. Mildly dominant but still somewhat diminutive left vertebral artery is patent to the skullbase without stenosis. CTA HEAD Posterior circulation: Patent but diminutive distal vertebral arteries, the left supplies the basilar and the right terminates in PICA. Left PICA is patent. Diminutive, thread-like basilar artery beyond the left AICA origin is then supplied distally by a persistent right trigeminal artery, unusual normal anatomic variation. Mild fusiform enlargement of the right trigeminal artery measuring 3 mm appears stable since 2006 (compare series 16, image 12 today to series 405, image 19 in 2006). Distal to the trigeminal artery the basilar has a more normal size, but there is a superimposed fetal right PCA origin. Normal SCA origins. The left PCA origin is also fetal type although a left P1 is visible. Bilateral PCA branches are normal. Anterior circulation: Both ICA siphons remain patent without atherosclerosis or stenosis. Mild  to moderate cavernous segment tortuosity is again noted. Bilateral ophthalmic and posterior communicating artery origins are normal. Carotid termini are stable and normal. MCA and ACA origins are normal. Anterior communicating artery and bilateral ACA branches are within normal limits. Left MCA M1 segment, bifurcation, and left MCA branches are stable and within normal limits. Right MCA M1 segment, bifurcation, and right MCA branches are stable and within normal limits. Venous sinuses: Patent. Dominant right transverse and sigmoid sinuses. Anatomic variants: Aberrant right subclavian artery origin. Shared origin of the common carotid arteries. Dominant left vertebral artery, the right terminates in PICA. Diminutive vertebrobasilar system augmented by persistent right side trigeminal artery. Fetal PCA origins, more so the right. Delayed  phase: No abnormal enhancement identified. Review of the MIP images confirms the above findings IMPRESSION: 1. Stable CTA head and neck since 2015. No atherosclerosis or stenosis. Mild anterior circulation tortuosity. 2. 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 But note that this vessel appears unchanged in size and configuration since 2006, such that this "fusiform aneurysm" is of doubtful significance. 3. Stable and normal for age CT appearance of the brain. 4. No acute findings in the neck. Electronically Signed   By: Genevie Ann M.D.   On: 04/21/2017 08:41    Procedures Procedures (including critical care time)  Medications Ordered in ED Medications  iopamidol (ISOVUE-370) 76 % injection (50 mLs  Contrast Given 04/21/17 0754)  sodium chloride 0.9 % bolus 1,000 mL (0 mLs Intravenous Stopped 04/21/17 1330)  dexamethasone (DECADRON) injection 10 mg (10 mg Intravenous Given 04/21/17 1130)  diphenhydrAMINE (BENADRYL) injection 25 mg (25 mg Intravenous Given 04/21/17 1132)  prochlorperazine (COMPAZINE) injection 10 mg (10 mg Intravenous Given 04/21/17 1131)     Initial Impression / Assessment and Plan / ED Course  I have reviewed the triage vital signs and the nursing notes.  Pertinent labs & imaging results that were available during my care of the patient were reviewed by me and considered in my medical decision making (see chart for details).     MALAYSIA CRANCE is a 66 y.o. female with a past medical history significant for Crohn's disease, TIA 2 on aspirin, and prior brain aneurysms who presents with sudden onset severe headache with associated nausea, mild blurred vision, and left facial numbness. Patient reports that she was last normal going to bed at approximately 10:00 p.m. last night. She describes waking up at approximately 3:30 this morning with a severe "worst headache of her life". Patient describes the  pain is located in her left neck going up into her occiput. She has nausea but no vomiting. She reports some mild blurry vision bilaterally. She denies any complaints below her neck and her arms or legs. She denies any chest pain, palpitations, conservation, diarrhea, dysuria. She denies fevers or chills. She denies any recent trauma. Of note, patient says that she is working out in the yard yesterday and mowing grass. Patient denies any pulled muscles. Patient describes severe headache that is worse than before. Patient does report some photophobia.  History and exam are seen above. On exam, patient had no numbness, tingling, or weakness of extremity. Normal coordination in her finger-nose-finger. Normal extraocular movements. Normal pupils. Patient has tense muscles in her left neck that were tender to palpation. Patient able to bend her neck. Patient had clear lungs and nontender chest or abdomen.   Given patient's history of cerebral aneurysm, subarachnoid hemorrhage from aneurysm strong  concern. One benign concern would be muscle spasm from her recent work in the yard. Patient will have CT imaging of the head as well as CTA of the head and neck to look for aneurysms. Anticipate following up on diagnostic testing.   7:44 AM While awaiting CT imaging, patient was reassessed. Patient reports her blurry vision has significant improved. Patient continues to have headache.  Diagnostic testing results are seen above. CTA of the head and neck and CT of the head showed no evidence of worsened aneurysm or bleed. Lab testing was grossly reassuring. No leukocytosis.  In the settings of no fevers, chills, or labs to suggest infection, now meningitis.  Patient reports her headache improved after headache cocktail. Patient continued to have some neck muscle soreness. Patient did not want to take Ativan but instead will be given a prescription for muscle relaxant for discharge. Patient will follow up with her  PCP and her neurologist for further management of her headache.   In the setting of her physical exertion yesterday, suspect muscle spasm in the neck caused his. Do not feel patient has a subarachnoid hemorrhage that is missed on imaging. Due to patient's prompt presentation and CT scanning before six hours, feel patient has adequately ruled out subarachnoid. Shared decision-making conversation help with patient about need for lumbar puncture and decision made, given her improving symptoms, to hold at this time. Patient will follow up with her PCP and observe strict return precautions.  Patient had resolution of her symptoms of the facial numbness in vision changes. Do not feel patient has a TIA at this time. Suspect headache causing the symptoms.  Patient understood plan of care and had no other questions or concerns. Patient discharged in good condition.     Final Clinical Impressions(s) / ED Diagnoses   Final diagnoses:  Headache  Neck pain  Bad headache    New Prescriptions Discharge Medication List as of 04/21/2017  3:25 PM    START taking these medications   Details  cyclobenzaprine (FLEXERIL) 5 MG tablet Take 1 tablet (5 mg total) by mouth 2 (two) times daily as needed for muscle spasms., Starting Thu 04/21/2017, Print        Clinical Impression: 1. Neck pain   2. Headache   3. Bad headache     Disposition: Discharge  Condition: Good  I have discussed the results, Dx and Tx plan with the pt(& family if present). He/she/they expressed understanding and agree(s) with the plan. Discharge instructions discussed at great length. Strict return precautions discussed and pt &/or family have verbalized understanding of the instructions. No further questions at time of discharge.    Discharge Medication List as of 04/21/2017  3:25 PM    START taking these medications   Details  cyclobenzaprine (FLEXERIL) 5 MG tablet Take 1 tablet (5 mg total) by mouth 2 (two) times daily as  needed for muscle spasms., Starting Thu 04/21/2017, Print        Follow Up: Vicenta Aly, [place] [address] [phone]  Schedule an appointment as soon as possible for a visit    [facility] [address] [id number] [place] [phone]  If symptoms worsen     Charlee Squibb, Gwenyth Allegra, MD 04/21/17 2015

## 2017-05-19 ENCOUNTER — Ambulatory Visit: Payer: BLUE CROSS/BLUE SHIELD | Attending: Nurse Practitioner | Admitting: Physical Therapy

## 2017-05-19 DIAGNOSIS — M542 Cervicalgia: Secondary | ICD-10-CM | POA: Insufficient documentation

## 2017-05-19 NOTE — Patient Instructions (Signed)
Chin tuck and cervical extension.

## 2017-05-19 NOTE — Therapy (Signed)
[facility] [place], [place], [address] Phone: [phone]   Fax:  [phone]  Physical Therapy Evaluation  Patient Details  Name: Stacy Hansen MRN: [id number] Date of Birth: Aug 30, 1951 Referring Provider: Vicenta Aly MD.  Encounter Date: 05/19/2017      PT End of Session - 05/19/17 0942    Visit Number 1   Number of Visits 12   Date for PT Re-Evaluation 06/30/17   PT Start Time 0858   PT Stop Time 0940   PT Time Calculation (min) 42 min   Activity Tolerance Patient tolerated treatment well   Behavior During Therapy [facility] for tasks assessed/performed      Past Medical History:  Diagnosis Date  . Anxiety   . Atypical chest pain   . Brain aneurysm   . Chronic abdominal pain   . Crohn disease ([place])   . Depression   . GERD (gastroesophageal reflux disease)   . 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 History of fibromyalgia   . Hyperplastic colon polyp   . Inflammatory bowel disease   . Panic attacks   . Stroke ([place])   . Tobacco abuse     Past Surgical History:  Procedure Laterality Date  . CATARACT EXTRACTION Bilateral   . IR GENERIC HISTORICAL  07/28/2016   IR RADIOLOGIST EVAL & MGMT 07/28/2016 [facility]-INTERV RAD  . ORIF ULNAR FRACTURE Left   . ROTATOR CUFF REPAIR Left   . TONSILLECTOMY    . TUBAL LIGATION    . VAGINAL HYSTERECTOMY     has her ovaries  . VARICOSE VEIN SURGERY Right     There were no vitals filed for this visit.       Subjective Assessment - 05/19/17 0919    Subjective The patient reports working in the yard about a month ago and felt pain in the left side of her neck.  If the patient moves her neck her pain will rise to 6/10.     Pertinent History Brain aneurysm.   How long can you sit comfortably? 15 minutes.   Patient Stated Goals Get out of pain.   Currently in Pain? Yes   Pain Score 6    Pain Location Neck   Pain Orientation Left   Pain Descriptors / Indicators  Spasm;Sharp   Pain Type Acute pain   Pain Onset 1 to 4 weeks ago   Pain Frequency Intermittent   Aggravating Factors  Movement.   Pain Relieving Factors Rest.            [facility] PT Assessment - 05/19/17 0001      Assessment   Medical Diagnosis Cervical pain.   Referring Provider Vicenta Aly MD.   Onset Date/Surgical Date --  One month.     Precautions   Precautions --  No traction.     Restrictions   Weight Bearing Restrictions No     Balance Screen   Has the patient fallen in the past 6 months No   Has the patient had a decrease in activity level because of a fear of falling?  No   Is the patient reluctant to leave their home because of a fear of falling?  No     Home Ecologist residence     Prior Function   Level of Independence Independent     Posture/Postural Control   Posture/Postural Control No significant limitations     ROM / Strength  AROM / PROM / Strength AROM;Strength     AROM   Overall AROM Comments Bilateral active cervical rotation= 40 degrees and bilateral SB= 5 degrees.     Strength   Overall Strength Comments Normal UE strength.     Palpation   Palpation comment Very tender to palpation over left mid-cervical region (middle Scalene muscle).     Special Tests    Special Tests --  Normal UE DTR's.     Ambulation/Gait   Gait Comments WNL.            Objective measurements completed on examination: See above findings.          [facility] Adult PT Treatment/Exercise - 05/19/17 0001      Modalities   Modalities Electrical Stimulation     Electrical Stimulation   Electrical Stimulation Location Left mid-cervical region.   Electrical Stimulation Action Pre-mod.   Electrical Stimulation Parameters 80-150 Hz (5 sec on and 5 sec off) x 20 minutes.   Electrical Stimulation Goals Tone;Pain                PT Education - 05/19/17 0932    Education provided Yes   Person(s) Educated Patient    Methods Explanation;Demonstration   Comprehension Verbalized understanding;Returned demonstration;Need further instruction          PT Short Term Goals - 05/19/17 0941      PT SHORT TERM GOAL #1   Title Independent with a HEP.   Time 2   Period Weeks   Status New     PT SHORT TERM GOAL #2   Title Bilateral active cervical rotation to 55 degrees.   Time 2   Period Weeks   Status New     PT SHORT TERM GOAL #3   Title Bilateral active cervical SBing= 15 degrees.   Time 4   Period Weeks   Status New           PT Long Term Goals - 05/19/17 0941      PT LONG TERM GOAL #1   Title Increase active cervical rotation to 70 degrees+ so patient can turn head more easily while driving.   Time 6   Period Weeks   Status New     PT LONG TERM GOAL #2   Title Perform ADL's with pain not > 2/10.   Time 6   Period Weeks   Status New                Plan - 05/19/17 5573    Clinical Impression Statement The patient reports to OPPT wiht c/o left sided neck pain as the result of swinging a tool in her yard.  Her cervical range of motion is significantly limited.  UE strength is normal.  DTR's are normal.  Patient will benefit from skilled physical therapy.   History and Personal Factors relevant to plan of care: Brain aneurysm.   Clinical Presentation Evolving   Clinical Presentation due to: Not improving.   Clinical Decision Making Low   Rehab Potential Excellent   PT Frequency 2x / week   PT Duration 6 weeks   PT Treatment/Interventions ADLs/Self Care Home Management;Cryotherapy;Electrical Stimulation;Ultrasound;Moist Heat;Therapeutic activities;Therapeutic exercise;Patient/family education;Passive range of motion;Manual techniques;Dry needling   PT Next Visit Plan Review chin tucks and cervical extension; Combo e'stim/U/S and STW/M.  Postural exercises.   Consulted and Agree with Plan of Care Patient      Patient will benefit from skilled therapeutic intervention in  order to improve  the following deficits and impairments:  Decreased activity tolerance, Pain, Decreased range of motion  Visit Diagnosis: Cervicalgia - Plan: PT plan of care cert/re-cert      G-Codes - 46/65/99 3570    Functional Assessment Tool Used (Outpatient Only) FOTO....60% limitation.   Functional Limitation Self care   Self Care Current Status [phone]) At least 60 percent but less than 80 percent impaired, limited or restricted   Self Care Goal Status (J0300) At least 1 percent but less than 20 percent impaired, limited or restricted       Problem List Patient Active Problem List   Diagnosis Date Noted  . Multiple pulmonary nodules 07/07/2016  . Dyspnea 07/07/2016  . TIA (transient ischemic attack)   . Left sided numbness 11/02/2014  . Tobacco use 11/02/2014  . Bradycardia 11/02/2014  . Brain aneurysm 11/02/2014  . Crohn disease ([place]) 11/02/2014  . Fibromyalgia 11/02/2014  . Left-sided weakness 11/02/2014  . Chronic anxiety 11/02/2014  . CVA (cerebral infarction) 07/10/2013  . Dizziness 05/05/2012  . Anxiety state, unspecified 12/11/2009  . TOBACCO ABUSE 12/11/2009  . Subjective visual disturbance 12/11/2009  . Myalgia and myositis 12/11/2009  . PALPITATIONS 12/11/2009  . Precordial pain 12/11/2009    Markala Sitts, [place] MPT 05/19/2017, 9:50 AM  [facility] [address] [place], [place], [address] Phone: [phone]   Fax:  [phone]  Name: Stacy Hansen MRN: [id number] Date of Birth: [birth year]

## 2017-05-25 ENCOUNTER — Ambulatory Visit: Payer: BLUE CROSS/BLUE SHIELD | Admitting: Physical Therapy

## 2017-05-25 ENCOUNTER — Encounter: Payer: Self-pay | Admitting: Physical Therapy

## 2017-05-25 DIAGNOSIS — M542 Cervicalgia: Secondary | ICD-10-CM

## 2017-05-25 NOTE — Therapy (Signed)
[facility] [place], [place], [address] Phone: [phone]   Fax:  [phone]  Physical Therapy Treatment  Patient Details  Name: Stacy Hansen MRN: [id number] Date of Birth: August 02, 1951 Referring Provider: Vicenta Aly MD.  Encounter Date: 05/25/2017      PT End of Session - 05/25/17 0817    Visit Number 2   Number of Visits 12   Date for PT Re-Evaluation 06/30/17   PT Start Time 0817   PT Stop Time 0902   PT Time Calculation (min) 45 min      Past Medical History:  Diagnosis Date  . Anxiety   . Atypical chest pain   . Brain aneurysm   . Chronic abdominal pain   . Crohn disease ([place])   . Depression   . GERD (gastroesophageal reflux disease)   . 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 History of fibromyalgia   . Hyperplastic colon polyp   . Inflammatory bowel disease   . Panic attacks   . Stroke ([place])   . Tobacco abuse     Past Surgical History:  Procedure Laterality Date  . CATARACT EXTRACTION Bilateral   . IR GENERIC HISTORICAL  07/28/2016   IR RADIOLOGIST EVAL & MGMT 07/28/2016 [facility]INTER[facility] RAD  . ORIF ULNAR FRACTURE Left   . ROTATOR CUFF REPAIR Left   . TONSILLECTOMY    . TUBAL LIGATION    . VAGINAL HYSTERECTOMY     has her ovaries  . VARICOSE VEIN SURGERY Right     There were no vitals filed for this visit.      Subjective Assessment - 05/25/17 0806    Subjective Reports her neck is better than it was but still hurts.    Pertinent History Brain aneurysm.   How long can you sit comfortably? 15 minutes.   Patient Stated Goals Get out of pain.   Currently in Pain? Yes   Pain Score 6    Pain Location Neck   Pain Orientation Left   Pain Descriptors / Indicators Tightness   Pain Type Acute pain   Pain Onset 1 to 4 weeks ago   Pain Frequency Constant            [facility] PT Assessment - 05/25/17 0001      Assessment   Medical Diagnosis Cervical pain.     Restrictions    Weight Bearing Restrictions No                     [facility] Adult PT Treatment/Exercise - 05/25/17 0001      Modalities   Modalities Electrical Stimulation;Moist Heat;Ultrasound     Moist Heat Therapy   Number Minutes Moist Heat 15 Minutes   Moist Heat Location Cervical     Electrical Stimulation   Electrical Stimulation Location L UT   Electrical Stimulation Action Pre-Mod   Electrical Stimulation Parameters 80-150 hz x15 min   Electrical Stimulation Goals Tone;Pain     Ultrasound   Ultrasound Location L UT   Ultrasound Parameters 1.5 w/cm2, 100%, 1 mhz x10 min   Ultrasound Goals Pain     Manual Therapy   Manual Therapy Soft tissue mobilization   Soft tissue mobilization STW/TPR to L UT/Levator Scapula to reduce pain and tone                   PT Short Term Goals - 05/19/17 0941      PT SHORT  TERM GOAL #1   Title Independent with a HEP.   Time 2   Period Weeks   Status New     PT SHORT TERM GOAL #2   Title Bilateral active cervical rotation to 55 degrees.   Time 2   Period Weeks   Status New     PT SHORT TERM GOAL #3   Title Bilateral active cervical SBing= 15 degrees.   Time 4   Period Weeks   Status New           PT Long Term Goals - 05/19/17 0941      PT LONG TERM GOAL #1   Title Increase active cervical rotation to 70 degrees+ so patient can turn head more easily while driving.   Time 6   Period Weeks   Status New     PT LONG TERM GOAL #2   Title Perform ADL's with pain not > 2/10.   Time 6   Period Weeks   Status New               Plan - 05/25/17 5035    Clinical Impression Statement Patient tolerated today's treatment well as she arrived with seeing some improvement in cervical pain. Patient intermittantly reported sensitiivty and soreness to L UT region over TPs throughout the musculature. Normal modalities response noted following removal of the modalities. Patient reports compliance with exercises recommended  by [place] Applegate, MPT.   Rehab Potential Excellent   PT Frequency 2x / week   PT Duration 6 weeks   PT Treatment/Interventions ADLs/Self Care Home Management;Cryotherapy;Electrical Stimulation;Ultrasound;Moist Heat;Therapeutic activities;Therapeutic exercise;Patient/family education;Passive range of motion;Manual techniques;Dry needling   PT Next Visit Plan Continue with STW and modalities per MPT POC.   Consulted and Agree with Plan of Care Patient      Patient will benefit from skilled therapeutic intervention in order to improve the following deficits and impairments:  Decreased activity tolerance, Pain, Decreased range of motion  Visit Diagnosis: Cervicalgia     Problem List Patient Active Problem List   Diagnosis Date Noted  . Multiple pulmonary nodules 07/07/2016  . Dyspnea 07/07/2016  . TIA (transient ischemic attack)   . Left sided numbness 11/02/2014  . Tobacco use 11/02/2014  . Bradycardia 11/02/2014  . Brain aneurysm 11/02/2014  . Crohn disease ([place]) 11/02/2014  . Fibromyalgia 11/02/2014  . Left-sided weakness 11/02/2014  . Chronic anxiety 11/02/2014  . CVA (cerebral infarction) 07/10/2013  . Dizziness 05/05/2012  . Anxiety state, unspecified 12/11/2009  . TOBACCO ABUSE 12/11/2009  . Subjective visual disturbance 12/11/2009  . Myalgia and myositis 12/11/2009  . PALPITATIONS 12/11/2009  . Precordial pain 12/11/2009    Wynelle Fanny, PTA 05/25/2017, 9:14 AM  [facility] [address] [place], [place], [address] Phone: [phone]   Fax:  [phone]  Name: MAGDALINE ZOLLARS MRN: [id number] Date of Birth: 08-29-1951

## 2017-05-26 ENCOUNTER — Encounter: Payer: BLUE CROSS/BLUE SHIELD | Admitting: Physical Therapy

## 2017-05-31 ENCOUNTER — Encounter: Payer: Self-pay | Admitting: Physical Therapy

## 2017-05-31 ENCOUNTER — Ambulatory Visit: Payer: BLUE CROSS/BLUE SHIELD | Admitting: Physical Therapy

## 2017-05-31 DIAGNOSIS — M542 Cervicalgia: Secondary | ICD-10-CM

## 2017-05-31 NOTE — Patient Instructions (Addendum)
Flexibility: Upper Trapezius Stretch    To stretch the left side of the neck, bring the right ear to right shoulder. Hold __30__ seconds. Repeat __3__ times per set. Do ____ sets per session. Do __3-4__ sessions per day.  http://orth.exer.us/340   Copyright  VHI. All rights reserved.  Levator Scapula Stretch    To stretch left side of neck, bring your right ear to right shoulder and then look down at right hip. Complete for both sides of the neck. Hold for 30 seconds each. Repeat __3__ times per set. Do ____ sets per session. Do __3-4__ sessions per day.  http://orth.exer.us/348   Copyright  VHI. All rights reserved.

## 2017-05-31 NOTE — Therapy (Signed)
[facility] [place], [place], [address] Phone: [phone]   Fax:  [phone]  Physical Therapy Treatment  Patient Details  Name: Stacy Hansen MRN: [id number] Date of Birth: Feb 28, 1951 Referring Provider: Vicenta Aly MD.  Encounter Date: 05/31/2017      PT End of Session - 05/31/17 1649    Visit Number 3   Number of Visits 12   Date for PT Re-Evaluation 06/30/17   PT Start Time 5188   PT Stop Time 1743   PT Time Calculation (min) 54 min   Activity Tolerance Patient tolerated treatment well   Behavior During Therapy [facility] for tasks assessed/performed      Past Medical History:  Diagnosis Date  . Anxiety   . Atypical chest pain   . Brain aneurysm   . Chronic abdominal pain   . Crohn disease ([place])   . Depression   . GERD (gastroesophageal reflux disease)   . 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 History of fibromyalgia   . Hyperplastic colon polyp   . Inflammatory bowel disease   . Panic attacks   . Stroke ([place])   . Tobacco abuse     Past Surgical History:  Procedure Laterality Date  . CATARACT EXTRACTION Bilateral   . IR GENERIC HISTORICAL  07/28/2016   IR RADIOLOGIST EVAL & MGMT 07/28/2016 [facility]INTER[facility] RAD  . ORIF ULNAR FRACTURE Left   . ROTATOR CUFF REPAIR Left   . TONSILLECTOMY    . TUBAL LIGATION    . VAGINAL HYSTERECTOMY     has her ovaries  . VARICOSE VEIN SURGERY Right     There were no vitals filed for this visit.      Subjective Assessment - 05/31/17 1647    Subjective Reports that her neck does feel some better and can rotate her neck better. Reports that her neck does not pop when she rotates her neck anymore. Did have to leave work early today as she had L cervical pain and L wrist pain.   Pertinent History Brain aneurysm.   How long can you sit comfortably? 15 minutes.   Patient Stated Goals Get out of pain.   Currently in Pain? Yes   Pain Score 5    Pain Location Neck    Pain Orientation Left   Pain Descriptors / Indicators Other (Comment)  wrist- "hurt", neck- tight   Pain Type Acute pain   Pain Onset 1 to 4 weeks ago            [facility] PT Assessment - 05/31/17 0001      Assessment   Medical Diagnosis Cervical pain.   Next MD Visit 10/2017     Restrictions   Weight Bearing Restrictions No                     [facility] Adult PT Treatment/Exercise - 05/31/17 0001      Modalities   Modalities Electrical Stimulation;Moist Heat;Ultrasound     Moist Heat Therapy   Number Minutes Moist Heat 15 Minutes   Moist Heat Location Cervical     Electrical Stimulation   Electrical Stimulation Location L UT   Electrical Stimulation Action Pre-Mod   Electrical Stimulation Parameters 80-150 hz x15 min   Electrical Stimulation Goals Tone;Pain     Ultrasound   Ultrasound Location L UT   Ultrasound Parameters 1.5 w/cm2, 100%, 1 mhz x10 min   Ultrasound Goals Pain  Manual Therapy   Manual Therapy Soft tissue mobilization   Soft tissue mobilization STW/TPR to L UT/Levator Scapula/mid scalene to reduce pain and tone                 PT Education - 05/31/17 1739    Education provided Yes   Education Details HEP- UT, Levator Scapula stretches   Person(s) Educated Patient   Methods Explanation;Demonstration;Verbal cues;Handout   Comprehension Verbalized understanding;Returned demonstration;Verbal cues required          PT Short Term Goals - 05/19/17 0941      PT SHORT TERM GOAL #1   Title Independent with a HEP.   Time 2   Period Weeks   Status New     PT SHORT TERM GOAL #2   Title Bilateral active cervical rotation to 55 degrees.   Time 2   Period Weeks   Status New     PT SHORT TERM GOAL #3   Title Bilateral active cervical SBing= 15 degrees.   Time 4   Period Weeks   Status New           PT Long Term Goals - 05/19/17 0941      PT LONG TERM GOAL #1   Title Increase active cervical rotation to 70 degrees+  so patient can turn head more easily while driving.   Time 6   Period Weeks   Status New     PT LONG TERM GOAL #2   Title Perform ADL's with pain not > 2/10.   Time 6   Period Weeks   Status New               Plan - 05/31/17 1752    Clinical Impression Statement Patient tolerated today's treatment well although sensitive intermittantly with manual therapy. Patient very sensitive along L mid to posterior scalene as well as UT region. Patient provided new HEP for UT and Levator Scapula stretch to assist in reducing tone and pain. Normal modalities response noted following removal of the modalities. Patient also provided paperwork as patient interested in buying TENS unit for home use.   Rehab Potential Excellent   PT Frequency 2x / week   PT Duration 6 weeks   PT Treatment/Interventions ADLs/Self Care Home Management;Cryotherapy;Electrical Stimulation;Ultrasound;Moist Heat;Therapeutic activities;Therapeutic exercise;Patient/family education;Passive range of motion;Manual techniques;Dry needling   PT Next Visit Plan Continue with STW and modalities per MPT POC.   PT Home Exercise Plan HEP- UT, Levator Scapula stretches   Consulted and Agree with Plan of Care Patient      Patient will benefit from skilled therapeutic intervention in order to improve the following deficits and impairments:  Decreased activity tolerance, Pain, Decreased range of motion  Visit Diagnosis: Cervicalgia     Problem List Patient Active Problem List   Diagnosis Date Noted  . Multiple pulmonary nodules 07/07/2016  . Dyspnea 07/07/2016  . TIA (transient ischemic attack)   . Left sided numbness 11/02/2014  . Tobacco use 11/02/2014  . Bradycardia 11/02/2014  . Brain aneurysm 11/02/2014  . Crohn disease ([place]) 11/02/2014  . Fibromyalgia 11/02/2014  . Left-sided weakness 11/02/2014  . Chronic anxiety 11/02/2014  . CVA (cerebral infarction) 07/10/2013  . Dizziness 05/05/2012  . Anxiety state,  unspecified 12/11/2009  . TOBACCO ABUSE 12/11/2009  . Subjective visual disturbance 12/11/2009  . Myalgia and myositis 12/11/2009  . PALPITATIONS 12/11/2009  . Precordial pain 12/11/2009    Wynelle Fanny, PTA 05/31/2017, 5:57 PM  [facility]  [place], [place], [address] Phone: [phone]   Fax:  [phone]  Name: Stacy Hansen MRN: [id number] Date of Birth: [birth year]

## 2017-06-01 ENCOUNTER — Telehealth: Payer: Self-pay | Admitting: Pulmonary Disease

## 2017-06-01 ENCOUNTER — Ambulatory Visit (INDEPENDENT_AMBULATORY_CARE_PROVIDER_SITE_OTHER): Payer: BLUE CROSS/BLUE SHIELD | Admitting: Pulmonary Disease

## 2017-06-01 ENCOUNTER — Ambulatory Visit (INDEPENDENT_AMBULATORY_CARE_PROVIDER_SITE_OTHER): Payer: BLUE CROSS/BLUE SHIELD | Admitting: *Deleted

## 2017-06-01 ENCOUNTER — Ambulatory Visit: Payer: BLUE CROSS/BLUE SHIELD | Admitting: Pulmonary Disease

## 2017-06-01 DIAGNOSIS — R0609 Other forms of dyspnea: Secondary | ICD-10-CM

## 2017-06-01 LAB — PULMONARY FUNCTION TEST
DL/VA % pred: 92 %
DL/VA: 4.36 ml/min/mmHg/L
DLCO COR % PRED: 80 %
DLCO UNC % PRED: 83 %
DLCO UNC: 19.61 ml/min/mmHg
DLCO cor: 18.94 ml/min/mmHg
FEF 25-75 POST: 1.03 L/s
FEF 25-75 PRE: 1.43 L/s
FEF2575-%Change-Post: -27 %
FEF2575-%PRED-PRE: 68 %
FEF2575-%Pred-Post: 49 %
FEV1-%Change-Post: -6 %
FEV1-%PRED-POST: 75 %
FEV1-%Pred-Pre: 80 %
FEV1-POST: 1.77 L
FEV1-Pre: 1.89 L
FEV1FVC-%Change-Post: 6 %
FEV1FVC-%Pred-Pre: 97 %
FEV6-%CHANGE-POST: -12 %
FEV6-%PRED-POST: 75 %
FEV6-%Pred-Pre: 85 %
FEV6-POST: 2.21 L
FEV6-Pre: 2.53 L
FEV6FVC-%PRED-POST: 104 %
FEV6FVC-%Pred-Pre: 104 %
FVC-%CHANGE-POST: -12 %
FVC-%PRED-PRE: 82 %
FVC-%Pred-Post: 72 %
FVC-POST: 2.22 L
FVC-Pre: 2.53 L
POST FEV6/FVC RATIO: 100 %
PRE FEV1/FVC RATIO: 75 %
Post FEV1/FVC ratio: 80 %
Pre FEV6/FVC Ratio: 100 %
RV % pred: 86 %
RV: 1.79 L
TLC % PRED: 88 %
TLC: 4.4 L

## 2017-06-01 NOTE — Telephone Encounter (Signed)
You can open a blocked spot in August. Thanks.

## 2017-06-01 NOTE — Progress Notes (Signed)
SIX MIN WALK 06/01/2017  Medications asa 358m, Vit D3 10073m, Vit B12 100039m all taken approx 8:00.   Supplimental Oxygen during Test? (L/min) No  Laps 8  Partial Lap (in Meters) 0  Baseline BP (sitting) 116/64  Baseline Heartrate 77  Baseline Dyspnea (Borg Scale) 3  Baseline Fatigue (Borg Scale) 7  Baseline SPO2 99  BP (sitting) 124/68  Heartrate 91  Dyspnea (Borg Scale) 3  Fatigue (Borg Scale) 7  SPO2 98  BP (sitting) 118/66  Heartrate 78  SPO2 99  Stopped or Paused before Six Minutes No  Distance Completed 384  Tech Comments: pt walked a moderately fast pace, tolerated walk well.

## 2017-06-01 NOTE — Progress Notes (Signed)
PFT done today. 

## 2017-06-01 NOTE — Telephone Encounter (Signed)
Spoke with pt, who states she would like to reschedule apt that was scheduled for today with JN 06/01/17 at 1:45 Pt arrived early for SMW & PFT and preferred to not wait until 1:45 to see JN. Pt request JN call with results.  Pt has been scheduled for 08/03/17 with JN, as this is first available.  JN please advise. Thanks.

## 2017-06-02 ENCOUNTER — Ambulatory Visit: Payer: BLUE CROSS/BLUE SHIELD | Admitting: Physical Therapy

## 2017-06-02 ENCOUNTER — Encounter: Payer: Self-pay | Admitting: Physical Therapy

## 2017-06-02 DIAGNOSIS — M542 Cervicalgia: Secondary | ICD-10-CM | POA: Diagnosis not present

## 2017-06-02 NOTE — Therapy (Signed)
[facility] [place], [place], [address] Phone: [phone]   Fax:  [phone]  Physical Therapy Treatment  Patient Details  Name: Stacy Hansen MRN: [id number] Date of Birth: [birth year] Referring Provider: Vicenta Aly MD.  Encounter Date: 06/02/2017      PT End of Session - 06/02/17 0808    Visit Number 4   Number of Visits 12   Date for PT Re-Evaluation 06/30/17   PT Start Time 0819   PT Stop Time 0905   PT Time Calculation (min) 46 min   Activity Tolerance Patient tolerated treatment well   Behavior During Therapy [facility] for tasks assessed/performed      Past Medical History:  Diagnosis Date  . Anxiety   . Atypical chest pain   . Brain aneurysm   . Chronic abdominal pain   . Crohn disease ([place])   . Depression   . GERD (gastroesophageal reflux disease)   . 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 History of fibromyalgia   . Hyperplastic colon polyp   . Inflammatory bowel disease   . Panic attacks   . Stroke ([place])   . Tobacco abuse     Past Surgical History:  Procedure Laterality Date  . CATARACT EXTRACTION Bilateral   . IR GENERIC HISTORICAL  07/28/2016   IR RADIOLOGIST EVAL & MGMT 07/28/2016 [facility]INTER[facility] RAD  . ORIF ULNAR FRACTURE Left   . ROTATOR CUFF REPAIR Left   . TONSILLECTOMY    . TUBAL LIGATION    . VAGINAL HYSTERECTOMY     has her ovaries  . VARICOSE VEIN SURGERY Right     There were no vitals filed for this visit.      Subjective Assessment - 06/02/17 0808    Subjective Reports that she does the stretches throughout the day and feels better.   Pertinent History Brain aneurysm.   How long can you sit comfortably? 15 minutes.   Patient Stated Goals Get out of pain.   Currently in Pain? Yes   Pain Score 5    Pain Location Neck   Pain Orientation Left   Pain Descriptors / Indicators Tightness   Pain Type Acute pain   Pain Onset 1 to 4 weeks ago            [facility] PT  Assessment - 06/02/17 0001      Assessment   Medical Diagnosis Cervical pain.   Next MD Visit 10/2017     Restrictions   Weight Bearing Restrictions No                     [facility] Adult PT Treatment/Exercise - 06/02/17 0001      Modalities   Modalities Electrical Stimulation;Moist Heat;Ultrasound     Moist Heat Therapy   Number Minutes Moist Heat 15 Minutes   Moist Heat Location Cervical     Electrical Stimulation   Electrical Stimulation Location L UT   Electrical Stimulation Action Pre-Mod   Electrical Stimulation Parameters 80-150 hz x15 min   Electrical Stimulation Goals Tone;Pain     Ultrasound   Ultrasound Location L UT   Ultrasound Parameters 1.5 w/cm2, 100%, 1 mhz x10 min   Ultrasound Goals Pain     Manual Therapy   Manual Therapy Soft tissue mobilization   Soft tissue mobilization STW/TPR to L UT/Levator Scapula/mid scalene to reduce pain and tone  PT Short Term Goals - 06/02/17 0856      PT SHORT TERM GOAL #1   Title Independent with a HEP.   Time 2   Period Weeks   Status Achieved     PT SHORT TERM GOAL #2   Title Bilateral active cervical rotation to 55 degrees.   Time 2   Period Weeks   Status On-going     PT SHORT TERM GOAL #3   Title Bilateral active cervical SBing= 15 degrees.   Time 4   Period Weeks   Status On-going           PT Long Term Goals - 05/19/17 0941      PT LONG TERM GOAL #1   Title Increase active cervical rotation to 70 degrees+ so patient can turn head more easily while driving.   Time 6   Period Weeks   Status New     PT LONG TERM GOAL #2   Title Perform ADL's with pain not > 2/10.   Time 6   Period Weeks   Status New               Plan - 06/02/17 3817    Clinical Impression Statement Patient tolerated today's treatment well as she reports compliance with HEP for cervical stretches. Tightness still palpated in mid L UT region today but palpable reduction in  tension as well as no reports of tenderness during manual therapy today. Normal modalities response noted following removal of the modalities.   Rehab Potential Excellent   PT Frequency 2x / week   PT Duration 6 weeks   PT Treatment/Interventions ADLs/Self Care Home Management;Cryotherapy;Electrical Stimulation;Ultrasound;Moist Heat;Therapeutic activities;Therapeutic exercise;Patient/family education;Passive range of motion;Manual techniques;Dry needling   PT Next Visit Plan Continue with STW and modalities per MPT POC.   PT Home Exercise Plan HEP- UT, Levator Scapula stretches   Consulted and Agree with Plan of Care Patient      Patient will benefit from skilled therapeutic intervention in order to improve the following deficits and impairments:  Decreased activity tolerance, Pain, Decreased range of motion  Visit Diagnosis: Cervicalgia     Problem List Patient Active Problem List   Diagnosis Date Noted  . Multiple pulmonary nodules 07/07/2016  . Dyspnea 07/07/2016  . TIA (transient ischemic attack)   . Left sided numbness 11/02/2014  . Tobacco use 11/02/2014  . Bradycardia 11/02/2014  . Brain aneurysm 11/02/2014  . Crohn disease ([place]) 11/02/2014  . Fibromyalgia 11/02/2014  . Left-sided weakness 11/02/2014  . Chronic anxiety 11/02/2014  . CVA (cerebral infarction) 07/10/2013  . Dizziness 05/05/2012  . Anxiety state, unspecified 12/11/2009  . TOBACCO ABUSE 12/11/2009  . Subjective visual disturbance 12/11/2009  . Myalgia and myositis 12/11/2009  . PALPITATIONS 12/11/2009  . Precordial pain 12/11/2009    Wynelle Fanny, PTA 06/02/2017, 9:09 AM  [facility] [address] [place], [place], [address] Phone: [phone]   Fax:  [phone]  Name: Stacy Hansen MRN: [id number] Date of Birth: 05/24/1951

## 2017-06-02 NOTE — Telephone Encounter (Signed)
Spoke with patient and she is aware that [facility] wants her to have a sooner appt. August appt was made and pt is aware of this. She had no additional questions at this time. Nothing further is needed

## 2017-06-07 ENCOUNTER — Ambulatory Visit: Payer: BLUE CROSS/BLUE SHIELD | Admitting: Physical Therapy

## 2017-06-07 DIAGNOSIS — M542 Cervicalgia: Secondary | ICD-10-CM

## 2017-06-07 NOTE — Therapy (Signed)
[facility] [place], [place], [address] Phone: [phone]   Fax:  [phone]  Physical Therapy Treatment  Patient Details  Name: Stacy Hansen MRN: [id number] Date of Birth: 1951/08/16 Referring Provider: Vicenta Aly MD.  Encounter Date: 06/07/2017      PT End of Session - 06/07/17 1735    Visit Number 5   Number of Visits 12   Date for PT Re-Evaluation 06/30/17   PT Start Time 0439   PT Stop Time 0533   PT Time Calculation (min) 54 min   Activity Tolerance Patient tolerated treatment well      Past Medical History:  Diagnosis Date  . Anxiety   . Atypical chest pain   . Brain aneurysm   . Chronic abdominal pain   . Crohn disease ([place])   . Depression   . GERD (gastroesophageal reflux disease)   . 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 History of fibromyalgia   . Hyperplastic colon polyp   . Inflammatory bowel disease   . Panic attacks   . Stroke ([place])   . Tobacco abuse     Past Surgical History:  Procedure Laterality Date  . CATARACT EXTRACTION Bilateral   . IR GENERIC HISTORICAL  07/28/2016   IR RADIOLOGIST EVAL & MGMT 07/28/2016 [facility]INTER[facility] RAD  . ORIF ULNAR FRACTURE Left   . ROTATOR CUFF REPAIR Left   . TONSILLECTOMY    . TUBAL LIGATION    . VAGINAL HYSTERECTOMY     has her ovaries  . VARICOSE VEIN SURGERY Right     There were no vitals filed for this visit.      Subjective Assessment - 06/07/17 1734    Subjective Those stretches kelsey showed me help.   Pain Score 5    Pain Location Neck   Pain Orientation Left   Pain Descriptors / Indicators Tightness   Pain Type Acute pain   Pain Onset 1 to 4 weeks ago                         [facility] Adult PT Treatment/Exercise - 06/07/17 0001      Modalities   Modalities Electrical Stimulation;Moist Heat     Moist Heat Therapy   Number Minutes Moist Heat 20 Minutes   Moist Heat Location --  Cervical.     Electrical Stimulation   Electrical Stimulation Location Left C-spine.   Electrical Stimulation Action Pre-mod.   Electrical Stimulation Parameters Constant at 80-150 hz x 20 minutes.   Electrical Stimulation Goals Tone;Pain     Ultrasound   Ultrasound Location --  Left cervical musculature.   Ultrasound Parameters Combo e'stim/U/S at 1.50 w/CM2 x 7 minutes.   Ultrasound Goals Pain     Manual Therapy   Manual Therapy Soft tissue mobilization   Soft tissue mobilization In supine:  STW/M, cervical range of motion and gentle manual traction x 16 minutes.                  PT Short Term Goals - 06/02/17 0856      PT SHORT TERM GOAL #1   Title Independent with a HEP.   Time 2   Period Weeks   Status Achieved     PT SHORT TERM GOAL #2   Title Bilateral active cervical rotation to 55 degrees.   Time 2   Period Weeks   Status On-going     PT  SHORT TERM GOAL #3   Title Bilateral active cervical SBing= 15 degrees.   Time 4   Period Weeks   Status On-going           PT Long Term Goals - 05/19/17 0941      PT LONG TERM GOAL #1   Title Increase active cervical rotation to 70 degrees+ so patient can turn head more easily while driving.   Time 6   Period Weeks   Status New     PT LONG TERM GOAL #2   Title Perform ADL's with pain not > 2/10.   Time 6   Period Weeks   Status New               Plan - 06/07/17 1738    Clinical Impression Statement Patient did well with treatment today.  She is very tender to plpation over left Scalene musculature.   Rehab Potential Excellent      Patient will benefit from skilled therapeutic intervention in order to improve the following deficits and impairments:  Decreased activity tolerance, Pain, Decreased range of motion  Visit Diagnosis: Cervicalgia     Problem List Patient Active Problem List   Diagnosis Date Noted  . Multiple pulmonary nodules 07/07/2016  . Dyspnea 07/07/2016  . TIA (transient ischemic  attack)   . Left sided numbness 11/02/2014  . Tobacco use 11/02/2014  . Bradycardia 11/02/2014  . Brain aneurysm 11/02/2014  . Crohn disease ([place]) 11/02/2014  . Fibromyalgia 11/02/2014  . Left-sided weakness 11/02/2014  . Chronic anxiety 11/02/2014  . CVA (cerebral infarction) 07/10/2013  . Dizziness 05/05/2012  . Anxiety state, unspecified 12/11/2009  . TOBACCO ABUSE 12/11/2009  . Subjective visual disturbance 12/11/2009  . Myalgia and myositis 12/11/2009  . PALPITATIONS 12/11/2009  . Precordial pain 12/11/2009    Buford Bremer, [place] MPT 06/07/2017, 5:39 PM  [facility] [address] [place], [place], [address] Phone: [phone]   Fax:  [phone]  Name: SHABNAM LADD MRN: [id number] Date of Birth: 12-27-1950

## 2017-06-09 ENCOUNTER — Ambulatory Visit: Payer: BLUE CROSS/BLUE SHIELD | Admitting: Physical Therapy

## 2017-06-09 ENCOUNTER — Encounter: Payer: Self-pay | Admitting: Physical Therapy

## 2017-06-09 DIAGNOSIS — M542 Cervicalgia: Secondary | ICD-10-CM | POA: Diagnosis not present

## 2017-06-09 NOTE — Therapy (Signed)
[facility] [place], [place], [address] Phone: [phone]   Fax:  [phone]  Physical Therapy Treatment  Patient Details  Name: Stacy Hansen MRN: [id number] Date of Birth: 1951-08-07 Referring Provider: Vicenta Aly MD.  Encounter Date: 06/09/2017      PT End of Session - 06/09/17 1313    Visit Number 6   Number of Visits 12   Date for PT Re-Evaluation 06/30/17   PT Start Time 1230   PT Stop Time 1315   PT Time Calculation (min) 45 min   Activity Tolerance Patient tolerated treatment well   Behavior During Therapy [facility] for tasks assessed/performed      Past Medical History:  Diagnosis Date  . Anxiety   . Atypical chest pain   . Brain aneurysm   . Chronic abdominal pain   . Crohn disease ([place])   . Depression   . GERD (gastroesophageal reflux disease)   . 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 History of fibromyalgia   . Hyperplastic colon polyp   . Inflammatory bowel disease   . Panic attacks   . Stroke ([place])   . Tobacco abuse     Past Surgical History:  Procedure Laterality Date  . CATARACT EXTRACTION Bilateral   . IR GENERIC HISTORICAL  07/28/2016   IR RADIOLOGIST EVAL & MGMT 07/28/2016 [facility] RAD  . ORIF ULNAR FRACTURE Left   . ROTATOR CUFF REPAIR Left   . TONSILLECTOMY    . TUBAL LIGATION    . VAGINAL HYSTERECTOMY     has her ovaries  . VARICOSE VEIN SURGERY Right     There were no vitals filed for this visit.      Subjective Assessment - 06/09/17 1236    Subjective Patient felt better after last treatment   Pertinent History Brain aneurysm.   How long can you sit comfortably? 15 minutes.   Patient Stated Goals Get out of pain.   Currently in Pain? Yes   Pain Score 5    Pain Location Neck   Pain Orientation Left   Pain Descriptors / Indicators Tightness   Pain Type Acute pain   Pain Onset 1 to 4 weeks ago   Pain Frequency Intermittent   Aggravating Factors  movement     Pain Relieving Factors at rest            [facility] PT Assessment - 06/09/17 0001      ROM / Strength   AROM / PROM / Strength AROM     AROM   AROM Assessment Site Cervical   Cervical - Right Rotation 40   Cervical - Left Rotation 70                     [facility] Adult PT Treatment/Exercise - 06/09/17 0001      Moist Heat Therapy   Number Minutes Moist Heat 15 Minutes   Moist Heat Location Cervical     Electrical Stimulation   Electrical Stimulation Location Left C-spine.   Electrical Stimulation Action premod   Electrical Stimulation Parameters 80-150hz  x63mn   Electrical Stimulation Goals Tone;Pain     Ultrasound   Ultrasound Location left UT   Ultrasound Parameters combo US/ES 1.5w/cm2/50%/1.mhz x839m     Manual Therapy   Manual Therapy Soft tissue mobilization;Passive ROM   Manual therapy comments manual STW/trigger point release to left mid scalene   Passive ROM gentle manual cervical rotation, stretching and traction  supine                  PT Short Term Goals - 06/02/17 0856      PT SHORT TERM GOAL #1   Title Independent with a HEP.   Time 2   Period Weeks   Status Achieved     PT SHORT TERM GOAL #2   Title Bilateral active cervical rotation to 55 degrees.   Time 2   Period Weeks   Status On-going     PT SHORT TERM GOAL #3   Title Bilateral active cervical SBing= 15 degrees.   Time 4   Period Weeks   Status On-going           PT Long Term Goals - 06/09/17 1311      PT LONG TERM GOAL #1   Title Increase active cervical rotation to 70 degrees+ so patient can turn head more easily while driving.   Time 6   Period Weeks   Status On-going     PT LONG TERM GOAL #2   Title Perform ADL's with pain not > 2/10.   Time 6   Period Weeks   Status On-going               Plan - 06/09/17 1312    Clinical Impression Statement Patient tolerated treatment well today. Patient feels some improvement overall yet  increased pain in mid scalene muscle on left cervical side. Patient has improved left cervical rotation yet no improvement with right side at this time. Goals progressing yet ongoing due to ROM and pain deficits.    Rehab Potential Excellent   PT Frequency 2x / week   PT Duration 6 weeks   PT Treatment/Interventions ADLs/Self Care Home Management;Cryotherapy;Electrical Stimulation;Ultrasound;Moist Heat;Therapeutic activities;Therapeutic exercise;Patient/family education;Passive range of motion;Manual techniques;Dry needling   PT Next Visit Plan Continue with STW and modalities per MPT POC.   Consulted and Agree with Plan of Care Patient      Patient will benefit from skilled therapeutic intervention in order to improve the following deficits and impairments:  Decreased activity tolerance, Pain, Decreased range of motion  Visit Diagnosis: Cervicalgia     Problem List Patient Active Problem List   Diagnosis Date Noted  . Multiple pulmonary nodules 07/07/2016  . Dyspnea 07/07/2016  . TIA (transient ischemic attack)   . Left sided numbness 11/02/2014  . Tobacco use 11/02/2014  . Bradycardia 11/02/2014  . Brain aneurysm 11/02/2014  . Crohn disease ([place]) 11/02/2014  . Fibromyalgia 11/02/2014  . Left-sided weakness 11/02/2014  . Chronic anxiety 11/02/2014  . CVA (cerebral infarction) 07/10/2013  . Dizziness 05/05/2012  . Anxiety state, unspecified 12/11/2009  . TOBACCO ABUSE 12/11/2009  . Subjective visual disturbance 12/11/2009  . Myalgia and myositis 12/11/2009  . PALPITATIONS 12/11/2009  . Precordial pain 12/11/2009    Tonji Elliff P, PTA 06/09/2017, 1:22 PM  [facility] [place], [place], [address] Phone: [phone]   Fax:  [phone]  Name: Stacy Hansen MRN: [id number] Date of Birth: [birth year]

## 2017-06-14 ENCOUNTER — Ambulatory Visit: Payer: BLUE CROSS/BLUE SHIELD | Admitting: *Deleted

## 2017-06-14 DIAGNOSIS — M542 Cervicalgia: Secondary | ICD-10-CM

## 2017-06-14 NOTE — Therapy (Signed)
[facility] [place], [place], [address] Phone: [phone]   Fax:  [phone]  Physical Therapy Treatment  Patient Details  Name: Stacy Hansen MRN: [id number] Date of Birth: 1951-10-01 Referring Provider: Vicenta Aly MD.  Encounter Date: 06/14/2017      PT End of Session - 06/14/17 1727    Visit Number 7   Number of Visits 12   Date for PT Re-Evaluation 06/30/17   PT Start Time 8088   PT Stop Time 1103   PT Time Calculation (min) 49 min      Past Medical History:  Diagnosis Date  . Anxiety   . Atypical chest pain   . Brain aneurysm   . Chronic abdominal pain   . Crohn disease ([place])   . Depression   . GERD (gastroesophageal reflux disease)   . 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 History of fibromyalgia   . Hyperplastic colon polyp   . Inflammatory bowel disease   . Panic attacks   . Stroke ([place])   . Tobacco abuse     Past Surgical History:  Procedure Laterality Date  . CATARACT EXTRACTION Bilateral   . IR GENERIC HISTORICAL  07/28/2016   IR RADIOLOGIST EVAL & MGMT 07/28/2016 [facility]INTER[facility] RAD  . ORIF ULNAR FRACTURE Left   . ROTATOR CUFF REPAIR Left   . TONSILLECTOMY    . TUBAL LIGATION    . VAGINAL HYSTERECTOMY     has her ovaries  . VARICOSE VEIN SURGERY Right     There were no vitals filed for this visit.      Subjective Assessment - 06/14/17 1650    Subjective Patient felt better after last treatment   Pertinent History Brain aneurysm.   How long can you sit comfortably? 15 minutes.   Patient Stated Goals Get out of pain.   Currently in Pain? Yes   Pain Score 5    Pain Location Neck                         [facility] Adult PT Treatment/Exercise - 06/14/17 0001      Modalities   Modalities Electrical Stimulation;Moist Heat     Electrical Stimulation   Electrical Stimulation Location Left C-spine.  premod 80-150hz  x 15 mins   Electrical Stimulation Goals  Tone;Pain     Ultrasound   Ultrasound Location LT UT and levator   Ultrasound Parameters combo x 10 mins 1.5 w /cm2    Ultrasound Goals Pain     Manual Therapy   Manual Therapy Soft tissue mobilization;Passive ROM   Manual therapy comments manual STW/trigger point release to left mid scalene, Levator scap                  PT Short Term Goals - 06/02/17 0856      PT SHORT TERM GOAL #1   Title Independent with a HEP.   Time 2   Period Weeks   Status Achieved     PT SHORT TERM GOAL #2   Title Bilateral active cervical rotation to 55 degrees.   Time 2   Period Weeks   Status On-going     PT SHORT TERM GOAL #3   Title Bilateral active cervical SBing= 15 degrees.   Time 4   Period Weeks   Status On-going           PT Long Term Goals - 06/09/17 1311  PT LONG TERM GOAL #1   Title Increase active cervical rotation to 70 degrees+ so patient can turn head more easily while driving.   Time 6   Period Weeks   Status On-going     PT LONG TERM GOAL #2   Title Perform ADL's with pain not > 2/10.   Time 6   Period Weeks   Status On-going               Plan - 06/14/17 1728    Clinical Impression Statement Pt arrived to clinic today-doing a little better with less pain and increased cervical rotation. She was still tender along LT cerv paras, scalenes and levator, but had  good TPR's. Normal response to mod.'s. No MH today due to not available   Rehab Potential Excellent   PT Frequency 2x / week   PT Duration 6 weeks   PT Treatment/Interventions ADLs/Self Care Home Management;Cryotherapy;Electrical Stimulation;Ultrasound;Moist Heat;Therapeutic activities;Therapeutic exercise;Patient/family education;Passive range of motion;Manual techniques;Dry needling   PT Next Visit Plan Continue with STW and modalities per MPT POC.   PT Home Exercise Plan HEP- UT, Levator Scapula stretches   Consulted and Agree with Plan of Care Patient      Patient will benefit  from skilled therapeutic intervention in order to improve the following deficits and impairments:  Decreased activity tolerance, Pain, Decreased range of motion  Visit Diagnosis: Cervicalgia     Problem List Patient Active Problem List   Diagnosis Date Noted  . Multiple pulmonary nodules 07/07/2016  . Dyspnea 07/07/2016  . TIA (transient ischemic attack)   . Left sided numbness 11/02/2014  . Tobacco use 11/02/2014  . Bradycardia 11/02/2014  . Brain aneurysm 11/02/2014  . Crohn disease ([place]) 11/02/2014  . Fibromyalgia 11/02/2014  . Left-sided weakness 11/02/2014  . Chronic anxiety 11/02/2014  . CVA (cerebral infarction) 07/10/2013  . Dizziness 05/05/2012  . Anxiety state, unspecified 12/11/2009  . TOBACCO ABUSE 12/11/2009  . Subjective visual disturbance 12/11/2009  . Myalgia and myositis 12/11/2009  . PALPITATIONS 12/11/2009  . Precordial pain 12/11/2009    Milbert Bixler,CHRIS, PTA 06/14/2017, 5:50 PM  [facility] [place], [place], [address] Phone: [phone]   Fax:  [phone]  Name: AXEL MEAS MRN: [id number] Date of Birth: 1951-03-24

## 2017-06-14 NOTE — Progress Notes (Signed)
Subjective:    Patient ID: Stacy Hansen, female    DOB: 1951/08/11, 66 y.o.   MRN: [id number]  C.C.:  Follow-up for Dyspnea on Exertion, Multiple Lung Nodules, & Tobacco Use Disorder.   HPI  Dyspnea on exertion: Patient's pulmonary function testing was normal. Patient does have mild apical emphysema on CT imaging. Patient wall test performed previously also demonstrates no significant hypoxia or desaturation. She reports her dyspnea is largely unchanged compared with her prior appointment. She denies any coughing or wheezing.  Multiple lung nodules: Seen on imaging in June 2017. There does not appear to be any appreciable change compared with CT imaging performed this may.  Tobacco use disorder: Previously patient was smoking 3-4 cigarettes per day and primarily smoking at work. She reports she has quit "cold [place]" in the past. She did get ill with Nicotine patches in the past. She is currently smoking 1/2 ppd. She believes they were 21 mg patches.   Review of Systems No chest pain. She does have occasional pressure in her chest that she attributes to anxiety. No reflux or dyspepsia. No abdominal pain or nausea.   Allergies  Allergen Reactions  . Ciprofloxacin Other (See Comments)    Unknown reaction per pt  . Meperidine Hcl Nausea And Vomiting and Other (See Comments)    Causes blood pressure to drop when taken with Phenergan through IV  . Opium Other (See Comments)    Causes blood pressure to drop when taken with Phenergan through IV  . Promethazine Hcl Nausea And Vomiting and Other (See Comments)    Causes blood pressure to drop when taken with Demeral via IV  . Propoxyphene N-Acetaminophen Other (See Comments)    "pass out"    Current Outpatient Prescriptions on File Prior to Visit  Medication Sig Dispense Refill  . acetaminophen (TYLENOL) 500 MG tablet Take 500 mg by mouth every 6 (six) hours as needed for moderate pain.    Marland Kitchen albuterol (PROVENTIL HFA;VENTOLIN HFA) 108 (90  BASE) MCG/ACT inhaler Inhale 2 puffs into the lungs every 6 (six) hours as needed for wheezing or shortness of breath.    Marland Kitchen aspirin 325 MG tablet Take 1 tablet (325 mg total) by mouth daily.    . butalbital-acetaminophen-caffeine (FIORICET, ESGIC) 50-325-40 MG per tablet Take 1 tablet by mouth every 6 (six) hours as needed for headache. 10 tablet 3  . carboxymethylcellulose (REFRESH PLUS) 0.5 % SOLN Place 1 drop into both eyes 3 (three) times daily as needed (dry/itching eyes).     . Cholecalciferol (VITAMIN D3) 1000 units CAPS Take 1,000 Units by mouth.     . cyclobenzaprine (FLEXERIL) 5 MG tablet Take 1 tablet (5 mg total) by mouth 2 (two) times daily as needed for muscle spasms. 14 tablet 0  . LORazepam (ATIVAN) 0.5 MG tablet Take 0.5 mg by mouth daily as needed for anxiety.    Marland Kitchen omeprazole (PRILOSEC) 40 MG capsule Take 1 capsule (40 mg total) by mouth daily. 30 capsule 11  . vitamin B-12 (CYANOCOBALAMIN) 1000 MCG tablet Take 1,000 mcg by mouth daily.     No current facility-administered medications on file prior to visit.     Past Medical History:  Diagnosis Date  . Anxiety   . Atypical chest pain   . Brain aneurysm   . Chronic abdominal pain   . Crohn disease ([place])   . Depression   . GERD (gastroesophageal reflux disease)   . 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 History of fibromyalgia   . Hyperplastic colon polyp   . Inflammatory bowel disease   . Panic attacks   . Stroke ([place])   . Tobacco abuse     Past Surgical History:  Procedure Laterality Date  . CATARACT EXTRACTION Bilateral   . IR GENERIC HISTORICAL  07/28/2016   IR RADIOLOGIST EVAL & MGMT 07/28/2016 [facility]  . ORIF ULNAR FRACTURE Left   . ROTATOR CUFF REPAIR Left   . TONSILLECTOMY    . TUBAL LIGATION    . VAGINAL HYSTERECTOMY     has her ovaries  . VARICOSE VEIN SURGERY Right     Family History  Problem Relation Age of Onset  . Diabetes Mother   . Kidney failure Mother   . Stroke Father   .  Colon cancer Brother        in his 39's  . Diabetes Brother   . Diabetes Sister        x 2    Social History   Social History  . Marital status: Married    Spouse name: N/A  . Number of children: 2  . Years of education: N/A   Occupational History  . [employer] Field seismologist   Social History Main Topics  . Smoking status: Current Every Day Smoker    Packs/day: 0.25    Years: 20.00    Types: Cigarettes  . Smokeless tobacco: Never Used  . Alcohol use No  . Drug use: No  . Sexual activity: Yes    Birth control/ protection: None   Other Topics Concern  . None   Social History Narrative  . None      Objective:   Physical Exam BP 120/68 (BP Location: Left Arm, Cuff Size: Normal)   Pulse 85   Ht 5' 3"  (1.6 m)   Wt 114 lb 3.2 oz (51.8 kg)   SpO2 100%   BMI 20.23 kg/m   General:  Awake. Elderly female. Alert. Integument:  Warm & dry. No rash. No bruising. Extremities:  No cyanosis or clubbing.  HEENT:  Moist mucus membranes. Mild bilateral nasal turbinate swelling. No oral ulcers. Cardiovascular:  Regular rate. No edema. Regular rhythm.  Pulmonary:  Clear to auscultation. Normal work of breathing on room air. Abdomen: Soft. Normal bowel sounds. Nondistended.  Musculoskeletal:  Normal bulk and tone. No joint deformity or effusion appreciated.  PFT 06/01/17: FVC 2.53 L (82%) FEV1 1.89 L (80%) FEV1/FVC 0.75 FEF 25-75 1.43 L (68%) negative bronchodilator response TLC 4.40 L (88%) RV 86% ERV 49% DLCO corrected 80% 07/07/16: FVC 2.61 L (85%) FEV1 1.89 L (81%) FEV1/FVC 0.72 FEF 25-75 1.36 L (64%)  6MWT 06/01/17:  Walked 384 meters / Baseline Sat 99% on RA / Nadir Sat 98% on RA  POLYSOMNOGRAM (05/15/14): Increased upper airway resistance without definite sleep apnea. Reduced sleep efficiency. Minimal oxygen desaturation related to respiratory events. Lowest saturation 90%. Heavy snoring. Nocturnal myoclonus with questionable clinical significance. Abnormal arousal  index. Diagnosed with periodic limb movement disorder.  IMAGING  CT CHEST W/O 04/07/17 (personally reviewed by me):  No developing nodules or masses. No pleural effusion or thickening. Mild subpleural reticulation in the apices consistent with scar tissue formation. Apical predominant mild emphysematous changes. No pathologic mediastinal adenopathy. No pericardial effusion. d  CT CHEST 05/12/16 (per office note/documentation):  Tiny bilateral lung nodules 68m in size or less. No definite acute abnormality.     Assessment & Plan:  66y.o.  female  with dyspnea on exertion, multiple lung nodules, and tobacco use disorder. Patient's lung nodules have not developed or changed appreciably in size on repeat CT imaging nearly one year later. Her previous chest CT scan and currently does show some mild apical predominant emphysema. Given her absence of an oxygen requirement with ambulation and on a function testing as noted above without a significant bronchodilator response I see no need for inhaler medications at this time. We did discuss her tobacco use at length and the potential worsening of her lung function with time and continued tobacco use. I instructed the patient contact my office if she had any new breathing problems or questions before next appointment.  1. Dyspnea on exertion:  Likely multifactorial. Holding on inhaler therapy at this time. 2. Pulmonary emphysema:  Screening probable 1 antitrypsin deficiency. Holding on inhaler therapy. 3. Multiple lung nodules:  Holding on further chest imaging at this time. Consider repeat CT imaging in 1 year. 4. Tobacco use disorder:  Spent 3 minutes counseling the patient on the need for complete tobacco cessation. Recommended nicotine replacement with lower dose patch. 5. Health maintenance: Status post Pneumovax October 2016. Patient to check about Prevnar vaccination at work. 6. Follow-up: Return to clinic in 6 months or sooner if needed.  Sonia Baller  Ashok Cordia, M.D. [facility] Pager:  [phone] After 3pm or if no response, call [phone] 10:16 AM 06/15/17

## 2017-06-15 ENCOUNTER — Ambulatory Visit: Payer: BLUE CROSS/BLUE SHIELD | Attending: Nurse Practitioner | Admitting: Physical Therapy

## 2017-06-15 ENCOUNTER — Other Ambulatory Visit: Payer: BLUE CROSS/BLUE SHIELD

## 2017-06-15 ENCOUNTER — Ambulatory Visit (INDEPENDENT_AMBULATORY_CARE_PROVIDER_SITE_OTHER): Payer: BLUE CROSS/BLUE SHIELD | Admitting: Pulmonary Disease

## 2017-06-15 ENCOUNTER — Encounter: Payer: Self-pay | Admitting: Pulmonary Disease

## 2017-06-15 VITALS — BP 120/68 | HR 85 | Ht 63.0 in | Wt 114.2 lb

## 2017-06-15 DIAGNOSIS — R0609 Other forms of dyspnea: Secondary | ICD-10-CM

## 2017-06-15 DIAGNOSIS — J439 Emphysema, unspecified: Secondary | ICD-10-CM

## 2017-06-15 DIAGNOSIS — F17200 Nicotine dependence, unspecified, uncomplicated: Secondary | ICD-10-CM | POA: Diagnosis not present

## 2017-06-15 DIAGNOSIS — R918 Other nonspecific abnormal finding of lung field: Secondary | ICD-10-CM | POA: Diagnosis not present

## 2017-06-15 NOTE — Patient Instructions (Addendum)
   Call me if you have any new breathing problems or questions before your next appointment.  Check to make sure you have had the Prevnar 13 Vaccine for pneumonia at [facility].  I will see you back in 6 months or sooner if needed.   TESTS ORDERED: 1. Serum alpha-1 antitrypsin phenotype & blood typing today today

## 2017-06-18 LAB — ALPHA-1 ANTITRYPSIN PHENOTYPE: A-1 Antitrypsin: 145 mg/dL (ref 83–199)

## 2017-06-23 ENCOUNTER — Telehealth: Payer: Self-pay | Admitting: Pulmonary Disease

## 2017-06-23 NOTE — Telephone Encounter (Signed)
Pt requesting Alpha-1 lab results.   JN please advise.  Thanks!

## 2017-06-23 NOTE — Telephone Encounter (Signed)
Spoke with pt, aware of results/recs.  Nothing further needed.  

## 2017-06-23 NOTE — Telephone Encounter (Signed)
She was normal.

## 2017-07-01 ENCOUNTER — Other Ambulatory Visit (HOSPITAL_COMMUNITY): Payer: Self-pay | Admitting: Interventional Radiology

## 2017-07-01 DIAGNOSIS — G452 Multiple and bilateral precerebral artery syndromes: Secondary | ICD-10-CM

## 2017-07-07 ENCOUNTER — Ambulatory Visit (HOSPITAL_COMMUNITY): Payer: BLUE CROSS/BLUE SHIELD

## 2017-07-07 ENCOUNTER — Encounter (HOSPITAL_COMMUNITY): Payer: Self-pay

## 2017-07-13 ENCOUNTER — Other Ambulatory Visit (HOSPITAL_COMMUNITY): Payer: Self-pay | Admitting: Interventional Radiology

## 2017-07-13 ENCOUNTER — Ambulatory Visit (HOSPITAL_COMMUNITY): Admission: RE | Admit: 2017-07-13 | Payer: BLUE CROSS/BLUE SHIELD | Source: Ambulatory Visit

## 2017-07-13 ENCOUNTER — Ambulatory Visit (HOSPITAL_COMMUNITY)
Admission: RE | Admit: 2017-07-13 | Discharge: 2017-07-13 | Disposition: A | Discharge status: Home / Self Care | Payer: BLUE CROSS/BLUE SHIELD | Source: Ambulatory Visit | Attending: Interventional Radiology | Admitting: Interventional Radiology

## 2017-07-13 DIAGNOSIS — G452 Multiple and bilateral precerebral artery syndromes: Secondary | ICD-10-CM

## 2017-07-13 LAB — POCT I-STAT CREATININE: Creatinine, Ser: 0.9 mg/dL (ref 0.44–1.00)

## 2017-07-13 MED ORDER — GADOBENATE DIMEGLUMINE 529 MG/ML IV SOLN: 10.0000 mL | Freq: Once | INTRAVENOUS | Status: DC | PRN

## 2017-07-29 ENCOUNTER — Telehealth (HOSPITAL_COMMUNITY): Payer: Self-pay

## 2017-07-29 NOTE — Telephone Encounter (Signed)
Called regarding recent mri. Left message for pt to return call. AW

## 2017-08-02 ENCOUNTER — Telehealth (HOSPITAL_COMMUNITY): Payer: Self-pay

## 2017-08-02 NOTE — Telephone Encounter (Signed)
Pt agreed to f/u in 2 years with mri/mra. AW

## 2017-08-03 ENCOUNTER — Ambulatory Visit: Payer: BLUE CROSS/BLUE SHIELD | Admitting: Pulmonary Disease

## 2017-10-09 ENCOUNTER — Encounter (HOSPITAL_COMMUNITY): Payer: Self-pay

## 2017-10-09 ENCOUNTER — Emergency Department (HOSPITAL_COMMUNITY)
Admission: EM | Admit: 2017-10-09 | Discharge: 2017-10-09 | Disposition: A | Discharge status: Home / Self Care | Payer: BLUE CROSS/BLUE SHIELD | Attending: Emergency Medicine | Admitting: Emergency Medicine

## 2017-10-09 ENCOUNTER — Emergency Department (HOSPITAL_COMMUNITY): Payer: BLUE CROSS/BLUE SHIELD

## 2017-10-09 ENCOUNTER — Other Ambulatory Visit: Payer: Self-pay

## 2017-10-09 DIAGNOSIS — M25462 Effusion, left knee: Secondary | ICD-10-CM | POA: Insufficient documentation

## 2017-10-09 DIAGNOSIS — F17210 Nicotine dependence, cigarettes, uncomplicated: Secondary | ICD-10-CM | POA: Insufficient documentation

## 2017-10-09 DIAGNOSIS — M1712 Unilateral primary osteoarthritis, left knee: Secondary | ICD-10-CM | POA: Diagnosis not present

## 2017-10-09 DIAGNOSIS — Z79899 Other long term (current) drug therapy: Secondary | ICD-10-CM | POA: Diagnosis not present

## 2017-10-09 DIAGNOSIS — Z7982 Long term (current) use of aspirin: Secondary | ICD-10-CM | POA: Diagnosis not present

## 2017-10-09 DIAGNOSIS — M25562 Pain in left knee: Secondary | ICD-10-CM | POA: Diagnosis present

## 2017-10-09 MED ORDER — HYDROCODONE-ACETAMINOPHEN 5-325 MG PO TABS
1.0000 | ORAL_TABLET | Freq: Once | ORAL | Status: AC
  Administered 2017-10-09: 1 via ORAL
  Filled 2017-10-09: qty 1

## 2017-10-09 MED ORDER — ONDANSETRON HCL 4 MG PO TABS
4.0000 mg | ORAL_TABLET | Freq: Once | ORAL | Status: AC
  Administered 2017-10-09: 4 mg via ORAL
  Filled 2017-10-09: qty 1

## 2017-10-09 MED ORDER — HYDROCODONE-ACETAMINOPHEN 5-325 MG PO TABS: 1.0000 | ORAL_TABLET | Freq: Four times a day (QID) | ORAL | 0 refills | Status: DC | PRN

## 2017-10-09 MED ORDER — DEXAMETHASONE SODIUM PHOSPHATE 10 MG/ML IJ SOLN: 10.0000 mg | Freq: Once | INTRAMUSCULAR | Status: DC

## 2017-10-09 NOTE — Discharge Instructions (Signed)
Your x-ray is negative for hidden fracture, or dislocation, but does show fluid in the joint or joint effusion.  There is also noted arthritis in the knee area.  Please call the Dr. Aline Brochure for orthopedic evaluation concerning the knee effusion and your pain.  Please use the knee immobilizer when you are up and about.  You may find it helpful to use a cane, or a walker to your seen by Dr. Aline Brochure.  Please elevate your knee above your waist when possible.  Use Tylenol extra strength for mild to moderate pain.  Use Norco for more severe pain.  Norco may cause drowsiness, and or lightheadedness.  Please use caution getting around when taking this medication.  Please take it with food.  Please do not drive a vehicle, drink alcohol, operate machinery, or handle legal documents when taking this medication.

## 2017-10-09 NOTE — ED Provider Notes (Signed)
[facility] EMERGENCY DEPARTMENT Provider Note   CSN: [id number] Arrival date & time: 10/09/17  3428     History   Chief Complaint Chief Complaint  Patient presents with  . Knee Pain    HPI Stacy Hansen is a 66 y.o. female.  Patient is a 66 year old female who presents to the emergency department with a complaint of left knee pain.  Patient states that 4 days ago she was walking up steps when she felt and heard a pop in her knee.  She has had pain in her knee since that time.  Patient has a history of fibromyalgia, vitamin D deficiency fractures, and osteopenia.      Past Medical History:  Diagnosis Date  . Anxiety   . Atypical chest pain   . Brain aneurysm   . Chronic abdominal pain   . Crohn disease ([place])   . Depression   . GERD (gastroesophageal reflux disease)   . 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 History of fibromyalgia   . Hyperplastic colon polyp   . Inflammatory bowel disease   . Panic attacks   . Stroke ([place])   . Tobacco abuse     Patient Active Problem List   Diagnosis Date Noted  . Multiple pulmonary nodules 07/07/2016  . Dyspnea 07/07/2016  . TIA (transient ischemic attack)   . Left sided numbness 11/02/2014  . Tobacco use 11/02/2014  . Bradycardia 11/02/2014  . Brain aneurysm 11/02/2014  . Crohn disease ([place]) 11/02/2014  . Fibromyalgia 11/02/2014  . Left-sided weakness 11/02/2014  . Chronic anxiety 11/02/2014  . CVA (cerebral infarction) 07/10/2013  . Dizziness 05/05/2012  . Anxiety state, unspecified 12/11/2009  . TOBACCO ABUSE 12/11/2009  . Subjective visual disturbance 12/11/2009  . Myalgia and myositis 12/11/2009  . PALPITATIONS 12/11/2009  . Precordial pain 12/11/2009    Past Surgical History:  Procedure Laterality Date  . CATARACT EXTRACTION Bilateral   . IR GENERIC HISTORICAL  07/28/2016   IR RADIOLOGIST EVAL & MGMT 07/28/2016 [facility]  . ORIF ULNAR FRACTURE Left   . ROTATOR CUFF REPAIR Left   .  TONSILLECTOMY    . TUBAL LIGATION    . VAGINAL HYSTERECTOMY     has her ovaries  . VARICOSE VEIN SURGERY Right     OB History    No data available       Home Medications    Prior to Admission medications   Medication Sig Start Date End Date Taking? Authorizing Provider  acetaminophen (TYLENOL) 500 MG tablet Take 500 mg by mouth every 6 (six) hours as needed for moderate pain.    [provider]  albuterol (PROVENTIL HFA;VENTOLIN HFA) 108 (90 BASE) MCG/ACT inhaler Inhale 2 puffs into the lungs every 6 (six) hours as needed for wheezing or shortness of breath.    [provider]  aspirin 325 MG tablet Take 1 tablet (325 mg total) by mouth daily. 11/03/14   Samuella Cota, MD  butalbital-acetaminophen-caffeine (FIORICET, ESGIC) [phone] MG per tablet Take 1 tablet by mouth every 6 (six) hours as needed for headache. 11/21/14   Marcial Pacas, MD  carboxymethylcellulose (REFRESH PLUS) 0.5 % SOLN Place 1 drop into both eyes 3 (three) times daily as needed (dry/itching eyes).     [provider]  Cholecalciferol (VITAMIN D3) 1000 units CAPS Take 1,000 Units by mouth.     [provider]  cyclobenzaprine (FLEXERIL) 5 MG tablet Take 1 tablet (5 mg total) by  mouth 2 (two) times daily as needed for muscle spasms. 04/21/17   Tegeler, Gwenyth Allegra, MD  LORazepam (ATIVAN) 0.5 MG tablet Take 0.5 mg by mouth daily as needed for anxiety.    [provider]  omeprazole (PRILOSEC) 40 MG capsule Take 1 capsule (40 mg total) by mouth daily. 03/31/16   Mauri Pole, MD  vitamin B-12 (CYANOCOBALAMIN) 1000 MCG tablet Take 1,000 mcg by mouth daily.    [provider]    Family History Family History  Problem Relation Age of Onset  . Diabetes Mother   . Kidney failure Mother   . Stroke Father   . Colon cancer Brother        in his 35's  . Diabetes Brother   . Diabetes Sister        x 2    Social History Social History   Tobacco Use  .  Smoking status: Current Every Day Smoker    Packs/day: 0.25    Years: 20.00    Pack years: 5.00    Types: Cigarettes  . Smokeless tobacco: Never Used  Substance Use Topics  . Alcohol use: No    Alcohol/week: 0.0 oz  . Drug use: No     Allergies   Ciprofloxacin; Meperidine hcl; Opium; Promethazine hcl; and Propoxyphene n-acetaminophen   Review of Systems Review of Systems  Constitutional: Negative for activity change.       All ROS Neg except as noted in HPI  HENT: Negative for nosebleeds.   Eyes: Negative for photophobia and discharge.  Respiratory: Negative for cough, shortness of breath and wheezing.   Cardiovascular: Negative for chest pain and palpitations.  Gastrointestinal: Negative for abdominal pain and blood in stool.  Genitourinary: Negative for dysuria, frequency and hematuria.  Musculoskeletal: Positive for arthralgias. Negative for back pain and neck pain.  Skin: Negative.   Neurological: Negative for dizziness, seizures and speech difficulty.  Psychiatric/Behavioral: Negative for confusion and hallucinations.     Physical Exam Updated Vital Signs BP (!) 105/50 (BP Location: Right Arm)   Pulse 81   Temp 98.4 F (36.9 C) (Oral)   Resp 16   Ht 5' 3"  (1.6 m)   Wt 53.5 kg (118 lb)   SpO2 96%   BMI 20.90 kg/m   Physical Exam  Constitutional: She is oriented to person, place, and time. She appears well-developed and well-nourished.  Non-toxic appearance.  HENT:  Head: Normocephalic.  Right Ear: Tympanic membrane and external ear normal.  Left Ear: Tympanic membrane and external ear normal.  Eyes: EOM and lids are normal. Pupils are equal, round, and reactive to light.  Neck: Normal range of motion. Neck supple. Carotid bruit is not present.  Cardiovascular: Normal rate, regular rhythm, normal heart sounds, intact distal pulses and normal pulses.  Pulmonary/Chest: Breath sounds normal. No respiratory distress.  Abdominal: Soft. Bowel sounds are normal.  There is no tenderness. There is no guarding.  Musculoskeletal:       Left knee: She exhibits decreased range of motion and effusion. She exhibits no erythema. Tenderness found. Lateral joint line tenderness noted.  There is no shortening of the lower extremity.  No hot joint appreciated.  There is an effusion of the left knee.  There is crepitus of the right knee.  There is good range of motion of the left hip and left ankle.  Lymphadenopathy:       Head (right side): No submandibular adenopathy present.       Head (left side):  No submandibular adenopathy present.    She has no cervical adenopathy.  Neurological: She is alert and oriented to person, place, and time. She has normal strength. No cranial nerve deficit or sensory deficit.  Skin: Skin is warm and dry.  Psychiatric: She has a normal mood and affect. Her speech is normal.  Nursing note and vitals reviewed.    ED Treatments / Results  Labs (all labs ordered are listed, but only abnormal results are displayed) Labs Reviewed - No data to display  EKG  EKG Interpretation None       Radiology No results found.  Procedures Procedures (including critical care time)  Medications Ordered in ED Medications - No data to display   Initial Impression / Assessment and Plan / ED Course  I have reviewed the triage vital signs and the nursing notes.  Pertinent labs & imaging results that were available during my care of the patient were reviewed by me and considered in my medical decision making (see chart for details).       Final Clinical Impressions(s) / ED Diagnoses MDM Vital signs within normal limits.  There is no shortening of the left lower extremity.  There is an effusion noted of the left knee area.  X-ray shows arthritis changes and effusion present.  No fracture or dislocation appreciated.  No neurovascular deficits appreciated at this time.  Patient fitted with a knee immobilizer.  I have asked her to use a  cane or walker until seen by Dr. Aline Brochure for orthopedic evaluation and management.  Patient will use Tylenol extra strength for mild pain.  She will use Norco every 6 hours if needed for more severe pain.  Patient is given a work note excusing her until she is evaluated by Dr. Aline Brochure.   Final diagnoses:  Effusion of knee joint, left  Primary osteoarthritis of left knee    ED Discharge Orders    None       Lily Kocher, PA-C 10/09/17 1053    Carmin Muskrat, MD 10/09/17 1531

## 2017-10-09 NOTE — ED Triage Notes (Addendum)
Reports of walking up steps Thursday and left knee popped. States pop sounded like a "22 pistol".

## 2017-10-11 ENCOUNTER — Ambulatory Visit (INDEPENDENT_AMBULATORY_CARE_PROVIDER_SITE_OTHER): Payer: BLUE CROSS/BLUE SHIELD | Admitting: Orthopaedic Surgery

## 2017-10-11 ENCOUNTER — Encounter: Payer: Self-pay | Admitting: Orthopaedic Surgery

## 2017-10-11 VITALS — BP 124/58 | HR 79 | Ht 63.0 in | Wt 115.0 lb

## 2017-10-11 DIAGNOSIS — G452 Multiple and bilateral precerebral artery syndromes: Secondary | ICD-10-CM

## 2017-10-11 DIAGNOSIS — M25562 Pain in left knee: Secondary | ICD-10-CM

## 2017-10-11 DIAGNOSIS — F17210 Nicotine dependence, cigarettes, uncomplicated: Secondary | ICD-10-CM

## 2017-10-11 NOTE — Progress Notes (Signed)
Subjective:    Patient ID: Stacy Hansen, female    DOB: 14-Jul-1951, 66 y.o.   MRN: [id number]  HPI She had acute onset of knee pain on Thanksgiving Day when she was stepping up a very small step.  She had acute pain of the medial left knee and heard a pop.  She has been unable to fully extend the knee since then and has pain with weight bearing and giving way.  She was seen in the ER on Sunday 10-09-17.  She had X-rays which were negative and given a knee immobilizer.  She is not improved.  She cannot bear much weight.  She is in pain.   Review of Systems  HENT: Negative for congestion.   Cardiovascular: Negative for chest pain and leg swelling.  Endocrine: Negative for cold intolerance.  Musculoskeletal: Positive for gait problem and joint swelling. Negative for arthralgias.  Allergic/Immunologic: Negative for environmental allergies.   She smokes a pack of cigarettes every three days.  She is willing to stop.  Past Medical History:  Diagnosis Date  . Anxiety   . Atypical chest pain   . Brain aneurysm   . Chronic abdominal pain   . Crohn disease ([place])   . Depression   . GERD (gastroesophageal reflux disease)   . 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 History of fibromyalgia   . Hyperplastic colon polyp   . Inflammatory bowel disease   . Panic attacks   . Stroke ([place])   . Tobacco abuse     Past Surgical History:  Procedure Laterality Date  . CATARACT EXTRACTION Bilateral   . IR GENERIC HISTORICAL  07/28/2016   IR RADIOLOGIST EVAL & MGMT 07/28/2016 [facility]INTER[facility] RAD  . ORIF ULNAR FRACTURE Left   . ROTATOR CUFF REPAIR Left   . TONSILLECTOMY    . TUBAL LIGATION    . VAGINAL HYSTERECTOMY     has her ovaries  . VARICOSE VEIN SURGERY Right     Current Outpatient Medications on File Prior to Visit  Medication Sig Dispense Refill  . acetaminophen (TYLENOL) 500 MG tablet Take 500 mg by mouth every 6 (six) hours as needed for moderate pain.    Marland Kitchen albuterol (PROVENTIL  HFA;VENTOLIN HFA) 108 (90 BASE) MCG/ACT inhaler Inhale 2 puffs into the lungs every 6 (six) hours as needed for wheezing or shortness of breath.    Marland Kitchen aspirin 325 MG tablet Take 1 tablet (325 mg total) by mouth daily.    . butalbital-acetaminophen-caffeine (FIORICET, ESGIC) 50-325-40 MG per tablet Take 1 tablet by mouth every 6 (six) hours as needed for headache. 10 tablet 3  . carboxymethylcellulose (REFRESH PLUS) 0.5 % SOLN Place 1 drop into both eyes 3 (three) times daily as needed (dry/itching eyes).     . Cholecalciferol (VITAMIN D3) 1000 units CAPS Take 1,000 Units by mouth.     . cyclobenzaprine (FLEXERIL) 5 MG tablet Take 1 tablet (5 mg total) by mouth 2 (two) times daily as needed for muscle spasms. 14 tablet 0  . HYDROcodone-acetaminophen (NORCO/VICODIN) 5-325 MG tablet Take 1 tablet by mouth every 6 (six) hours as needed. 15 tablet 0  . LORazepam (ATIVAN) 0.5 MG tablet Take 0.5 mg by mouth daily as needed for anxiety.    Marland Kitchen omeprazole (PRILOSEC) 40 MG capsule Take 1 capsule (40 mg total) by mouth daily. 30 capsule 11  . vitamin B-12 (CYANOCOBALAMIN) 1000 MCG tablet Take 1,000 mcg by mouth daily.  No current facility-administered medications on file prior to visit.     Social History   Socioeconomic History  . Marital status: Married    Spouse name: Not on file  . Number of children: 2  . Years of education: Not on file  . Highest education level: Not on file  Social Needs  . Financial resource strain: Not on file  . Food insecurity - worry: Not on file  . Food insecurity - inability: Not on file  . Transportation needs - medical: Not on file  . Transportation needs - non-medical: Not on file  Occupational History  . Occupation: Systems developer: [employer]  Tobacco Use  . Smoking status: Current Every Day Smoker    Packs/day: 0.25    Years: 20.00    Pack years: 5.00    Types: Cigarettes  . Smokeless tobacco: Never Used  Substance and Sexual Activity   . Alcohol use: No    Alcohol/week: 0.0 oz  . Drug use: No  . Sexual activity: Yes    Birth control/protection: None  Other Topics Concern  . Not on file  Social History Narrative  . Not on file    Family History  Problem Relation Age of Onset  . Diabetes Mother   . Kidney failure Mother   . Stroke Father   . Colon cancer Brother        in his 31's  . Diabetes Brother   . Diabetes Sister        x 2    BP (!) 124/58   Pulse 79   Ht 5' 3"  (1.6 m)   Wt 115 lb (52.2 kg)   BMI 20.37 kg/m      Objective:   Physical Exam  Constitutional: She is oriented to person, place, and time. She appears well-developed and well-nourished.  HENT:  Head: Normocephalic and atraumatic.  Eyes: Conjunctivae and EOM are normal. Pupils are equal, round, and reactive to light.  Neck: Normal range of motion. Neck supple.  Cardiovascular: Normal rate, regular rhythm and intact distal pulses.  Pulmonary/Chest: Effort normal.  Abdominal: Soft.  Musculoskeletal: She exhibits tenderness (Left knee painful, ROM 5 to 90 with lack of full extension, positive medial McMurray, marked limp left, NV intact, right knee negative.).  Neurological: She is alert and oriented to person, place, and time. She displays normal reflexes. No cranial nerve deficit. She exhibits normal muscle tone. Coordination normal.  Skin: Skin is warm and dry.  Psychiatric: She has a normal mood and affect. Her behavior is normal. Judgment and thought content normal.  Vitals reviewed.    I reviewed the ER records, x-rays and xray report.     Assessment & Plan:   Encounter Diagnoses  Name Primary?  . Acute pain of left knee Yes  . Cigarette nicotine dependence without complication    I am very concerned about a medial meniscus tear as she cannot fully extend the knee, the history, positive medial McMurray.  I would like to get a MRI of the knee.  She most likely needs arthroscopy.  PROCEDURE NOTE:  The patient requests  injections of the left knee , verbal consent was obtained.  The left knee was prepped appropriately after time out was performed.   Sterile technique was observed and injection of 1 cc of Depo-Medrol 40 mg with several cc's of plain xylocaine. Anesthesia was provided by ethyl chloride and a 20-gauge needle was used to inject the knee area. The injection  was tolerated well.  A band aid dressing was applied.  The patient was advised to apply ice later today and tomorrow to the injection sight as needed.  Call if any problem.  Precautions discussed.   Electronically Signed Sanjuana Kava, MD 11/27/20188:32 AM

## 2017-10-11 NOTE — Patient Instructions (Addendum)
Your MRI has been ordered.  We will contact your insurance company for approval. [facility] [place], [place].  Their scheduling number is [phone].  They will call you to schedule the appointment after the study has been given an authorization number.  If you have not been given an appointment within within 5 business days please call [phone] and ask for the pre-authorization representative in our office.     Knee Exercises Ask your health care provider which exercises are safe for you. Do exercises exactly as told by your health care provider and adjust them as directed. It is normal to feel mild stretching, pulling, tightness, or discomfort as you do these exercises, but you should stop right away if you feel sudden pain or your pain gets worse.Do not begin these exercises until told by your health care provider. STRETCHING AND RANGE OF MOTION EXERCISES These exercises warm up your muscles and joints and improve the movement and flexibility of your knee. These exercises also help to relieve pain, numbness, and tingling. Exercise A: Knee Extension, Prone 1. Lie on your abdomen on a bed. 2. Place your left / right knee just beyond the edge of the surface so your knee is not on the bed. You can put a towel under your left / right thigh just above your knee for comfort. 3. Relax your leg muscles and allow gravity to straighten your knee. You should feel a stretch behind your left / right knee. 4. Hold this position for __________ seconds. 5. Scoot up so your knee is supported between repetitions. Repeat __________ times. Complete this stretch __________ times a day. Exercise B: Knee Flexion, Active  1. Lie on your back with both knees straight. If this causes back discomfort, bend your left / right knee so your foot is flat on the floor. 2. Slowly slide your left / right heel back toward your buttocks until you feel a gentle stretch in the front of your knee or  thigh. 3. Hold this position for __________ seconds. 4. Slowly slide your left / right heel back to the starting position. Repeat __________ times. Complete this exercise __________ times a day. Exercise C: Quadriceps, Prone  1. Lie on your abdomen on a firm surface, such as a bed or padded floor. 2. Bend your left / right knee and hold your ankle. If you cannot reach your ankle or pant leg, loop a belt around your foot and grab the belt instead. 3. Gently pull your heel toward your buttocks. Your knee should not slide out to the side. You should feel a stretch in the front of your thigh and knee. 4. Hold this position for __________ seconds. Repeat __________ times. Complete this stretch __________ times a day. Exercise D: Hamstring, Supine 1. Lie on your back. 2. Loop a belt or towel over the ball of your left / right foot. The ball of your foot is on the walking surface, right under your toes. 3. Straighten your left / right knee and slowly pull on the belt to raise your leg until you feel a gentle stretch behind your knee. ? Do not let your left / right knee bend while you do this. ? Keep your other leg flat on the floor. 4. Hold this position for __________ seconds. Repeat __________ times. Complete this stretch __________ times a day. STRENGTHENING EXERCISES These exercises build strength and endurance in your knee. Endurance is the ability to use your muscles for a long time, even  after they get tired. Exercise E: Quadriceps, Isometric  1. Lie on your back with your left / right leg extended and your other knee bent. Put a rolled towel or small pillow under your knee if told by your health care provider. 2. Slowly tense the muscles in the front of your left / right thigh. You should see your kneecap slide up toward your hip or see increased dimpling just above the knee. This motion will push the back of the knee toward the floor. 3. For __________ seconds, keep the muscle as tight as  you can without increasing your pain. 4. Relax the muscles slowly and completely. Repeat __________ times. Complete this exercise __________ times a day. Exercise F: Straight Leg Raises - Quadriceps 1. Lie on your back with your left / right leg extended and your other knee bent. 2. Tense the muscles in the front of your left / right thigh. You should see your kneecap slide up or see increased dimpling just above the knee. Your thigh may even shake a bit. 3. Keep these muscles tight as you raise your leg 4-6 inches (10-15 cm) off the floor. Do not let your knee bend. 4. Hold this position for __________ seconds. 5. Keep these muscles tense as you lower your leg. 6. Relax your muscles slowly and completely after each repetition. Repeat __________ times. Complete this exercise __________ times a day. Exercise G: Hamstring, Isometric 1. Lie on your back on a firm surface. 2. Bend your left / right knee approximately __________ degrees. 3. Dig your left / right heel into the surface as if you are trying to pull it toward your buttocks. Tighten the muscles in the back of your thighs to dig as hard as you can without increasing any pain. 4. Hold this position for __________ seconds. 5. Release the tension gradually and allow your muscles to relax completely for __________ seconds after each repetition. Repeat __________ times. Complete this exercise __________ times a day. Exercise H: Hamstring Curls  If told by your health care provider, do this exercise while wearing ankle weights. Begin with __________ weights. Then increase the weight by 1 lb (0.5 kg) increments. Do not wear ankle weights that are more than __________. 1. Lie on your abdomen with your legs straight. 2. Bend your left / right knee as far as you can without feeling pain. Keep your hips flat against the floor. 3. Hold this position for __________ seconds. 4. Slowly lower your leg to the starting position.  Repeat __________  times. Complete this exercise __________ times a day. Exercise I: Squats (Quadriceps) 1. Stand in front of a table, with your feet and knees pointing straight ahead. You may rest your hands on the table for balance but not for support. 2. Slowly bend your knees and lower your hips like you are going to sit in a chair. ? Keep your weight over your heels, not over your toes. ? Keep your lower legs upright so they are parallel with the table legs. ? Do not let your hips go lower than your knees. ? Do not bend lower than told by your health care provider. ? If your knee pain increases, do not bend as low. 3. Hold the squat position for __________ seconds. 4. Slowly push with your legs to return to standing. Do not use your hands to pull yourself to standing. Repeat __________ times. Complete this exercise __________ times a day. Exercise J: Wall Slides (Quadriceps)  1. Lean your back against  a smooth wall or door while you walk your feet out 18-24 inches (46-61 cm) from it. 2. Place your feet hip-width apart. 3. Slowly slide down the wall or door until your knees bend __________ degrees. Keep your knees over your heels, not over your toes. Keep your knees in line with your hips. 4. Hold for __________ seconds. Repeat __________ times. Complete this exercise __________ times a day. Exercise K: Straight Leg Raises - Hip Abductors 1. Lie on your side with your left / right leg in the top position. Lie so your head, shoulder, knee, and hip line up. You may bend your bottom knee to help you keep your balance. 2. Roll your hips slightly forward so your hips are stacked directly over each other and your left / right knee is facing forward. 3. Leading with your heel, lift your top leg 4-6 inches (10-15 cm). You should feel the muscles in your outer hip lifting. ? Do not let your foot drift forward. ? Do not let your knee roll toward the ceiling. 4. Hold this position for __________ seconds. 5. Slowly  return your leg to the starting position. 6. Let your muscles relax completely after each repetition. Repeat __________ times. Complete this exercise __________ times a day. Exercise L: Straight Leg Raises - Hip Extensors 1. Lie on your abdomen on a firm surface. You can put a pillow under your hips if that is more comfortable. 2. Tense the muscles in your buttocks and lift your left / right leg about 4-6 inches (10-15 cm). Keep your knee straight as you lift your leg. 3. Hold this position for __________ seconds. 4. Slowly lower your leg to the starting position. 5. Let your leg relax completely after each repetition. Repeat __________ times. Complete this exercise __________ times a day. This information is not intended to replace advice given to you by your health care provider. Make sure you discuss any questions you have with your health care provider. Document Released: 09/15/2005 Document Revised: 07/26/2016 Document Reviewed: 09/07/2015 Elsevier Interactive Patient Education  2018 [employer]. Steps to Quit Smoking Smoking tobacco can be bad for your health. It can also affect almost every organ in your body. Smoking puts you and people around you at risk for many serious long-lasting (chronic) diseases. Quitting smoking is hard, but it is one of the best things that you can do for your health. It is never too late to quit. What are the benefits of quitting smoking? When you quit smoking, you lower your risk for getting serious diseases and conditions. They can include:  Lung cancer or lung disease.  Heart disease.  Stroke.  Heart attack.  Not being able to have children (infertility).  Weak bones (osteoporosis) and broken bones (fractures).  If you have coughing, wheezing, and shortness of breath, those symptoms may get better when you quit. You may also get sick less often. If you are pregnant, quitting smoking can help to lower your chances of having a baby of low birth  weight. What can I do to help me quit smoking? Talk with your doctor about what can help you quit smoking. Some things you can do (strategies) include:  Quitting smoking totally, instead of slowly cutting back how much you smoke over a period of time.  Going to in-person counseling. You are more likely to quit if you go to many counseling sessions.  Using resources and support systems, such as: ? Database administrator with a Social worker. ? Phone quitlines. ? Printed  self-help materials. ? Support groups or group counseling. ? Text messaging programs. ? Mobile phone apps or applications.  Taking medicines. Some of these medicines may have nicotine in them. If you are pregnant or breastfeeding, do not take any medicines to quit smoking unless your doctor says it is okay. Talk with your doctor about counseling or other things that can help you.  Talk with your doctor about using more than one strategy at the same time, such as taking medicines while you are also going to in-person counseling. This can help make quitting easier. What things can I do to make it easier to quit? Quitting smoking might feel very hard at first, but there is a lot that you can do to make it easier. Take these steps:  Talk to your family and friends. Ask them to support and encourage you.  Call phone quitlines, reach out to support groups, or work with a Social worker.  Ask people who smoke to not smoke around you.  Avoid places that make you want (trigger) to smoke, such as: ? Bars. ? Parties. ? Smoke-break areas at work.  Spend time with people who do not smoke.  Lower the stress in your life. Stress can make you want to smoke. Try these things to help your stress: ? Getting regular exercise. ? Deep-breathing exercises. ? Yoga. ? Meditating. ? Doing a body scan. To do this, close your eyes, focus on one area of your body at a time from head to toe, and notice which parts of your body are tense. Try to relax the  muscles in those areas.  Download or buy apps on your mobile phone or tablet that can help you stick to your quit plan. There are many free apps, such as QuitGuide from the [employer] Office manager for Disease Control and Prevention). You can find more support from smokefree.gov and other websites.  This information is not intended to replace advice given to you by your health care provider. Make sure you discuss any questions you have with your health care provider. Document Released: 08/28/2009 Document Revised: 06/29/2016 Document Reviewed: 03/18/2015 Elsevier Interactive Patient Education  2018 [employer].

## 2017-10-12 ENCOUNTER — Encounter: Payer: Self-pay | Admitting: Orthopaedic Surgery

## 2017-10-12 ENCOUNTER — Telehealth: Payer: Self-pay | Admitting: Orthopaedic Surgery

## 2017-10-12 NOTE — Telephone Encounter (Signed)
OK 

## 2017-10-12 NOTE — Telephone Encounter (Signed)
Patient has short-term disability forms from employer and is aware of our process of completing.  States she was advised to stay out of work while MRI is being approved and scheduled. Okay to issue note?

## 2017-10-13 NOTE — Telephone Encounter (Signed)
10/12/17 done

## 2017-10-18 ENCOUNTER — Encounter: Payer: Self-pay | Admitting: Orthopaedic Surgery

## 2017-10-18 ENCOUNTER — Ambulatory Visit (INDEPENDENT_AMBULATORY_CARE_PROVIDER_SITE_OTHER): Payer: BLUE CROSS/BLUE SHIELD | Admitting: Orthopaedic Surgery

## 2017-10-18 VITALS — BP 123/59 | HR 83 | Ht 63.0 in | Wt 115.0 lb

## 2017-10-18 DIAGNOSIS — G452 Multiple and bilateral precerebral artery syndromes: Secondary | ICD-10-CM

## 2017-10-18 DIAGNOSIS — M25562 Pain in left knee: Secondary | ICD-10-CM | POA: Diagnosis not present

## 2017-10-18 DIAGNOSIS — F17210 Nicotine dependence, cigarettes, uncomplicated: Secondary | ICD-10-CM | POA: Diagnosis not present

## 2017-10-18 NOTE — Progress Notes (Signed)
Patient Stacy Hansen, female DOB:1951/07/05, 66 y.o. GHW:[id number]  Chief Complaint  Patient presents with  . Knee Pain    left  . Results    review MRI scan     HPI  Stacy Hansen is a 66 y.o. female who has left knee pain.  She had MRI done at the open unit in [place] which showed tears of the medial and lateral meniscus and extensor tendinosis without tar and large effusion.  I have explained the findings to her.  I have recommended arthroscopy of the left knee.  I will have Dr. Aline Hansen see her.  She is agreeable.  She is still having pain and swelling and giving way.  She continues to smoke and I have told her to cut way back prior to surgery.  She is willing to do this. HPI  Body mass index is 20.37 kg/m.  ROS  Review of Systems  HENT: Negative for congestion.   Cardiovascular: Negative for chest pain and leg swelling.  Endocrine: Negative for cold intolerance.  Musculoskeletal: Positive for gait problem and joint swelling. Negative for arthralgias.  Allergic/Immunologic: Negative for environmental allergies.  All other systems reviewed and are negative.   Past Medical History:  Diagnosis Date  . Anxiety   . Atypical chest pain   . Brain aneurysm   . Chronic abdominal pain   . Crohn disease ([place])   . Depression   . GERD (gastroesophageal reflux disease)   . 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 History of fibromyalgia   . Hyperplastic colon polyp   . Inflammatory bowel disease   . Panic attacks   . Stroke ([place])   . Tobacco abuse     Past Surgical History:  Procedure Laterality Date  . CATARACT EXTRACTION Bilateral   . IR GENERIC HISTORICAL  07/28/2016   IR RADIOLOGIST EVAL & MGMT 07/28/2016 [facility]  . ORIF ULNAR FRACTURE Left   . ROTATOR CUFF REPAIR Left   . TONSILLECTOMY    . TUBAL LIGATION    . VAGINAL HYSTERECTOMY     has her ovaries  . VARICOSE VEIN SURGERY Right     Family History  Problem Relation Age of Onset  .  Diabetes Mother   . Kidney failure Mother   . Stroke Father   . Colon cancer Brother        in his 68's  . Diabetes Brother   . Diabetes Sister        x 2    Social History Social History   Tobacco Use  . Smoking status: Current Every Day Smoker    Packs/day: 0.25    Years: 20.00    Pack years: 5.00    Types: Cigarettes  . Smokeless tobacco: Never Used  Substance Use Topics  . Alcohol use: No    Alcohol/week: 0.0 oz  . Drug use: No    Allergies  Allergen Reactions  . Dexamethasone     halluncinations  . Ciprofloxacin Other (See Comments)    Unknown reaction per pt  . Meperidine Hcl Nausea And Vomiting and Other (See Comments)    Causes blood pressure to drop when taken with Phenergan through IV  . Opium Other (See Comments)    Causes blood pressure to drop when taken with Phenergan through IV  . Promethazine Hcl Nausea And Vomiting and Other (See Comments)    Causes blood pressure to drop when taken with Demeral via IV  . Propoxyphene  N-Acetaminophen Other (See Comments)    "pass out"    Current Outpatient Medications  Medication Sig Dispense Refill  . acetaminophen (TYLENOL) 500 MG tablet Take 500 mg by mouth every 6 (six) hours as needed for moderate pain.    Marland Kitchen albuterol (PROVENTIL HFA;VENTOLIN HFA) 108 (90 BASE) MCG/ACT inhaler Inhale 2 puffs into the lungs every 6 (six) hours as needed for wheezing or shortness of breath.    Marland Kitchen aspirin 325 MG tablet Take 1 tablet (325 mg total) by mouth daily.    . butalbital-acetaminophen-caffeine (FIORICET, ESGIC) 50-325-40 MG per tablet Take 1 tablet by mouth every 6 (six) hours as needed for headache. 10 tablet 3  . carboxymethylcellulose (REFRESH PLUS) 0.5 % SOLN Place 1 drop into both eyes 3 (three) times daily as needed (dry/itching eyes).     . Cholecalciferol (VITAMIN D3) 1000 units CAPS Take 1,000 Units by mouth.     . cyclobenzaprine (FLEXERIL) 5 MG tablet Take 1 tablet (5 mg total) by mouth 2 (two) times daily as  needed for muscle spasms. 14 tablet 0  . HYDROcodone-acetaminophen (NORCO/VICODIN) 5-325 MG tablet Take 1 tablet by mouth every 6 (six) hours as needed. 15 tablet 0  . LORazepam (ATIVAN) 0.5 MG tablet Take 0.5 mg by mouth daily as needed for anxiety.    Marland Kitchen omeprazole (PRILOSEC) 40 MG capsule Take 1 capsule (40 mg total) by mouth daily. 30 capsule 11  . vitamin B-12 (CYANOCOBALAMIN) 1000 MCG tablet Take 1,000 mcg by mouth daily.     No current facility-administered medications for this visit.      Physical Exam  Blood pressure (!) 123/59, pulse 83, height 5' 3"  (1.6 m), weight 115 lb (52.2 kg).  Constitutional: overall normal hygiene, normal nutrition, well developed, normal grooming, normal body habitus. Assistive device:none  Musculoskeletal: gait and station Limp left, muscle tone and strength are normal, no tremors or atrophy is present.  .  Neurological: coordination overall normal.  Deep tendon reflex/nerve stretch intact.  Sensation normal.  Cranial nerves II-XII intact.   Skin:   Normal overall no scars, lesions, ulcers or rashes. No psoriasis.  Psychiatric: Alert and oriented x 3.  Recent memory intact, remote memory unclear.  Normal mood and affect. Well groomed.  Good eye contact.  Cardiovascular: overall no swelling, no varicosities, no edema bilaterally, normal temperatures of the legs and arms, no clubbing, cyanosis and good capillary refill.  Lymphatic: palpation is normal.  All other systems reviewed and are negative   The patient has been educated about the nature of the problem(s) and counseled on treatment options.  The patient appeared to understand what I have discussed and is in agreement with it.  Encounter Diagnoses  Name Primary?  . Acute pain of left knee Yes  . Cigarette nicotine dependence without complication     PLAN Call if any problems.  Precautions discussed.  Continue current medications.   Return to clinic to see Dr. Aline Hansen  The patient  was counseled about smoking and smoking cessation.  I spent about three to five minutes in doing this.  I, of course, determined the patient does smoke and frequency.  I have advised the patient of problems medically that can occur with continued smoking including poor wound and bone healing as well as the obvious respiratory problems.  I have assessed the patient's willingness to cut back and quit smoking.  The patient has stated she will try to cut back prior surgery of the left knee.  I have  told the patient to discuss with their family doctor also about smoking cessation.  I am willing to assist my patient in arranging this and to get additional help.I have suggested the patient have a set date to try to begin cutting back.  I have printed out a smoking cessation form about how to begin cutting back and suggestions.  I will discuss this further when I see the patient in the future.  I have stressed that although this will be very difficult to accomplish, the benefits are very substantial and will give long term health benefits from now on.   Electronically Signed Sanjuana Kava, MD 12/4/201810:07 AM

## 2017-10-18 NOTE — Patient Instructions (Signed)
Coping with Quitting Smoking Quitting smoking is a physical and mental challenge. You will face cravings, withdrawal symptoms, and temptation. Before quitting, work with your health care provider to make a plan that can help you cope. Preparation can help you quit and keep you from giving in. How can I cope with cravings? Cravings usually last for 5-10 minutes. If you get through it, the craving will pass. Consider taking the following actions to help you cope with cravings:  Keep your mouth busy: ? Chew sugar-free gum. ? Suck on hard candies or a straw. ? Brush your teeth.  Keep your hands and body busy: ? Immediately change to a different activity when you feel a craving. ? Squeeze or play with a ball. ? Do an activity or a hobby, like making bead jewelry, practicing needlepoint, or working with wood. ? Mix up your normal routine. ? Take a short exercise break. Go for a quick walk or run up and down stairs. ? Spend time in public places where smoking is not allowed.  Focus on doing something kind or helpful for someone else.  Call a friend or family member to talk during a craving.  Join a support group.  Call a quit line, such as 1-800-QUIT-NOW.  Talk with your health care provider about medicines that might help you cope with cravings and make quitting easier for you.  How can I deal with withdrawal symptoms? Your body may experience negative effects as it tries to get used to not having nicotine in the system. These effects are called withdrawal symptoms. They may include:  Feeling hungrier than normal.  Trouble concentrating.  Irritability.  Trouble sleeping.  Feeling depressed.  Restlessness and agitation.  Craving a cigarette.  To manage withdrawal symptoms:  Avoid places, people, and activities that trigger your cravings.  Remember why you want to quit.  Get plenty of sleep.  Avoid coffee and other caffeinated drinks. These may worsen some of your  symptoms.  How can I handle social situations? Social situations can be difficult when you are quitting smoking, especially in the first few weeks. To manage this, you can:  Avoid parties, bars, and other social situations where people might be smoking.  Avoid alcohol.  Leave right away if you have the urge to smoke.  Explain to your family and friends that you are quitting smoking. Ask for understanding and support.  Plan activities with friends or family where smoking is not an option.  What are some ways I can cope with stress? Wanting to smoke may cause stress, and stress can make you want to smoke. Find ways to manage your stress. Relaxation techniques can help. For example:  Breathe slowly and deeply, in through your nose and out through your mouth.  Listen to soothing, relaxing music.  Talk with a family member or friend about your stress.  Light a candle.  Soak in a bath or take a shower.  Think about a peaceful place.  What are some ways I can prevent weight gain? Be aware that many people gain weight after they quit smoking. However, not everyone does. To keep from gaining weight, have a plan in place before you quit and stick to the plan after you quit. Your plan should include:  Having healthy snacks. When you have a craving, it may help to: ? Eat plain popcorn, crunchy carrots, celery, or other cut vegetables. ? Chew sugar-free gum.  Changing how you eat: ? Eat small portion sizes at meals. ?   Eat 4-6 small meals throughout the day instead of 1-2 large meals a day. ? Be mindful when you eat. Do not watch television or do other things that might distract you as you eat.  Exercising regularly: ? Make time to exercise each day. If you do not have time for a long workout, do short bouts of exercise for 5-10 minutes several times a day. ? Do some form of strengthening exercise, like weight lifting, and some form of aerobic exercise, like running or  swimming.  Drinking plenty of water or other low-calorie or no-calorie drinks. Drink 6-8 glasses of water daily, or as much as instructed by your health care provider.  Summary  Quitting smoking is a physical and mental challenge. You will face cravings, withdrawal symptoms, and temptation to smoke again. Preparation can help you as you go through these challenges.  You can cope with cravings by keeping your mouth busy (such as by chewing gum), keeping your body and hands busy, and making calls to family, friends, or a helpline for people who want to quit smoking.  You can cope with withdrawal symptoms by avoiding places where people smoke, avoiding drinks with caffeine, and getting plenty of rest.  Ask your health care provider about the different ways to prevent weight gain, avoid stress, and handle social situations. This information is not intended to replace advice given to you by your health care provider. Make sure you discuss any questions you have with your health care provider. Document Released: 10/29/2016 Document Revised: 10/29/2016 Document Reviewed: 10/29/2016 Elsevier Interactive Patient Education  2018 [employer]  

## 2017-11-02 ENCOUNTER — Encounter: Payer: Self-pay | Admitting: Orthopedic Surgery

## 2017-11-02 ENCOUNTER — Telehealth: Payer: Self-pay | Admitting: Radiology

## 2017-11-02 ENCOUNTER — Ambulatory Visit (INDEPENDENT_AMBULATORY_CARE_PROVIDER_SITE_OTHER): Payer: BLUE CROSS/BLUE SHIELD | Admitting: Orthopedic Surgery

## 2017-11-02 VITALS — BP 129/76 | HR 75 | Ht 63.0 in | Wt 116.0 lb

## 2017-11-02 DIAGNOSIS — M23321 Other meniscus derangements, posterior horn of medial meniscus, right knee: Secondary | ICD-10-CM | POA: Diagnosis not present

## 2017-11-02 DIAGNOSIS — M1712 Unilateral primary osteoarthritis, left knee: Secondary | ICD-10-CM

## 2017-11-02 DIAGNOSIS — M23300 Other meniscus derangements, unspecified lateral meniscus, right knee: Secondary | ICD-10-CM

## 2017-11-02 DIAGNOSIS — G452 Multiple and bilateral precerebral artery syndromes: Secondary | ICD-10-CM

## 2017-11-02 NOTE — Progress Notes (Signed)
Progress Note   Patient ID: Stacy Hansen, female   DOB: [birth year], 66 y.o.   MRN: [id number]  Chief Complaint  Patient presents with  . Knee Pain    left    66 year old female presents for evaluation for probable arthroscopy nonradiating dull aching left knee for pain medial joint line.  Patient fell in 2017 since that time she said progressively increasing and debilitating left knee pain over the medial side of the joint.  Since she developed pain on the medial side of the joint she has also noticed that she has been limping and she has had some back pain with some calf cramping and occasionally the left leg will fall asleep.  She complains of decreased range of motion of the left knee intermittent swelling with weightbearing.  Her medications include Tylenol and hydrocodone for pain  She had an MRI and plain films and she has a torn medial lateral meniscus and a medial compartment left knee.  She has not improved she would like to have surgery to try to improve her function and decrease her pain     Review of Systems  HENT:       Headache  Occasionally, worked up   Respiratory: Negative for shortness of breath.   Cardiovascular: Negative for chest pain.  Musculoskeletal:       Cramp left calf   Neurological: Negative for tingling.  Psychiatric/Behavioral: The patient is nervous/anxious.    Current Meds  Medication Sig  . acetaminophen (TYLENOL) 500 MG tablet Take 500 mg by mouth every 6 (six) hours as needed for moderate pain.  Marland Kitchen albuterol (PROVENTIL HFA;VENTOLIN HFA) 108 (90 BASE) MCG/ACT inhaler Inhale 2 puffs into the lungs every 6 (six) hours as needed for wheezing or shortness of breath.  Marland Kitchen aspirin 325 MG tablet Take 1 tablet (325 mg total) by mouth daily.  . butalbital-acetaminophen-caffeine (FIORICET, ESGIC) 50-325-40 MG per tablet Take 1 tablet by mouth every 6 (six) hours as needed for headache.  . carboxymethylcellulose (REFRESH PLUS) 0.5 % SOLN Place 1 drop into  both eyes 3 (three) times daily as needed (dry/itching eyes).   . Cholecalciferol (VITAMIN D3) 1000 units CAPS Take 1,000 Units by mouth.   . cyclobenzaprine (FLEXERIL) 5 MG tablet Take 1 tablet (5 mg total) by mouth 2 (two) times daily as needed for muscle spasms.  Marland Kitchen LORazepam (ATIVAN) 0.5 MG tablet Take 0.5 mg by mouth daily as needed for anxiety.  Marland Kitchen omeprazole (PRILOSEC) 40 MG capsule Take 1 capsule (40 mg total) by mouth daily.  . vitamin B-12 (CYANOCOBALAMIN) 1000 MCG tablet Take 1,000 mcg by mouth daily.    Past Medical History:  Diagnosis Date  . Anxiety   . Atypical chest pain   . Brain aneurysm   . Chronic abdominal pain   . Crohn disease ([place])   . Depression   . GERD (gastroesophageal reflux disease)   . 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 History of fibromyalgia   . Hyperplastic colon polyp   . Inflammatory bowel disease   . Panic attacks   . Stroke ([place])   . Tobacco abuse    Past Surgical History:  Procedure Laterality Date  . CATARACT EXTRACTION Bilateral   . IR GENERIC HISTORICAL  07/28/2016   IR RADIOLOGIST EVAL & MGMT 07/28/2016 [facility]  . ORIF ULNAR FRACTURE Left   . ROTATOR CUFF REPAIR Left   . TONSILLECTOMY    . TUBAL LIGATION    .  VAGINAL HYSTERECTOMY     has her ovaries  . VARICOSE VEIN SURGERY Right    Family History  Problem Relation Age of Onset  . Diabetes Mother   . Kidney failure Mother   . Stroke Father   . Colon cancer Brother        in his 77's  . Diabetes Brother   . Diabetes Sister        x 2   Social History   Tobacco Use  . Smoking status: Current Every Day Smoker    Packs/day: 0.25    Years: 20.00    Pack years: 5.00    Types: Cigarettes  . Smokeless tobacco: Never Used  Substance Use Topics  . Alcohol use: No    Alcohol/week: 0.0 oz  . 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 Dexamethasone     halluncinations  . Ciprofloxacin Other (See  Comments)    Unknown reaction per pt  . Meperidine Hcl Nausea And Vomiting and Other (See Comments)    Causes blood pressure to drop when taken with Phenergan through IV  . Opium Other (See Comments)    Causes blood pressure to drop when taken with Phenergan through IV  . Promethazine Hcl Nausea And Vomiting and Other (See Comments)    Causes blood pressure to drop when taken with Demeral via IV  . Propoxyphene N-Acetaminophen Other (See Comments)    "pass out"    BP 129/76   Pulse 75   Ht 5' 3"  (1.6 m)   Wt 116 lb (52.6 kg)   BMI 20.55 kg/m    Physical Exam  Constitutional: She is oriented to person, place, and time. She appears well-developed and well-nourished.  Musculoskeletal:       Right knee: She exhibits no effusion.       Left knee: She exhibits no effusion.  Neurological: She is alert and oriented to person, place, and time. Gait abnormal.  Abnormal gait since pain started in her left knee with some back pain pain in the back of the knee and some cramping left calf  Psychiatric: She has a normal mood and affect. Judgment normal.  Vitals reviewed.   Right Knee Exam   Muscle Strength  The patient has normal right knee strength.  Tenderness  The patient is experiencing no tenderness.   Range of Motion  Extension: normal  Flexion: normal   Tests  Mc[place]ray:  Medial - negative Lateral - negative Varus: negative Valgus: negative Drawer:  Anterior - negative    Posterior - negative  Other  Erythema: absent Scars: absent Sensation: normal Pulse: present Swelling: none Effusion: no effusion present   Left Knee Exam   Muscle Strength  The patient has normal left knee strength.  Tenderness  The patient is experiencing tenderness in the medial joint line, lateral joint line and patella.  Range of Motion  Extension: normal  Flexion:  90 normal   Tests  McMurray:  Medial - positive Lateral - negative Varus: negative Valgus: negative Drawer:  Anterior  - negative     Posterior - negative  Other  Erythema: absent Scars: absent Sensation: normal Pulse: present Swelling: none Effusion: no effusion present       Medical decision-making  My interpretation of the   Imaging:   Plain film shows osteoarthritis medial compartment left knee with normal alignment no sclerosis or osteophyte formation  MRI images show torn medial  meniscus torn lateral meniscus and tendinosis in the extensor mechanism quadriceps and patellar tendon diffuse cartilage loss medial compartment without exposed bone  reports included   FINDINGS: No evidence of fracture or dislocation. There is a small suprapatellar effusion. No evidence of arthropathy or other focal bone abnormality. Soft tissues are unremarkable.   IMPRESSION: Small suprapatellar joint effusion without underlying osseous abnormality.     Electronically Signed   By: Kristopher Oppenheim M.D.   On: 10/09/2017 09:58  The MRI report is recorded in the record from an outside facility impression was medial and lateral meniscal tears extensor tendinosis without tear large effusion and mild chondromalacia in the medial patellar facet moderate cartilage loss medial femoral condyle central weightbearing surface Encounter Diagnoses  Name Primary?  . Derangement of posterior horn of medial meniscus of right knee Yes  . Meniscus, lateral, derangement, right   . Primary osteoarthritis of left knee      Left knee arthroscopy medial and lateral meniscectomy  We discussed 4-6 weeks of recovery time including therapy and time out of work  The procedure has been fully reviewed with the patient; The risks and benefits of surgery have been discussed and explained and understood. Alternative treatment has also been reviewed, questions were encouraged and answered. The postoperative plan is also been reviewed.   She will need a medical clearance from the neurologist secondary to her aneurysm and several  mini strokes the last one being this year    Arther Abbott, MD 11/02/2017 8:54 AM

## 2017-11-02 NOTE — Patient Instructions (Addendum)
Steps to Quit Smoking Smoking tobacco can be bad for your health. It can also affect almost every organ in your body. Smoking puts you and people around you at risk for many serious long-lasting (chronic) diseases. Quitting smoking is hard, but it is one of the best things that you can do for your health. It is never too late to quit. What are the benefits of quitting smoking? When you quit smoking, you lower your risk for getting serious diseases and conditions. They can include:  Lung cancer or lung disease.  Heart disease.  Stroke.  Heart attack.  Not being able to have children (infertility).  Weak bones (osteoporosis) and broken bones (fractures).  If you have coughing, wheezing, and shortness of breath, those symptoms may get better when you quit. You may also get sick less often. If you are pregnant, quitting smoking can help to lower your chances of having a baby of low birth weight. What can I do to help me quit smoking? Talk with your doctor about what can help you quit smoking. Some things you can do (strategies) include:  Quitting smoking totally, instead of slowly cutting back how much you smoke over a period of time.  Going to in-person counseling. You are more likely to quit if you go to many counseling sessions.  Using resources and support systems, such as: ? Database administrator with a Social worker. ? Phone quitlines. ? Careers information officer. ? Support groups or group counseling. ? Text messaging programs. ? Mobile phone apps or applications.  Taking medicines. Some of these medicines may have nicotine in them. If you are pregnant or breastfeeding, do not take any medicines to quit smoking unless your doctor says it is okay. Talk with your doctor about counseling or other things that can help you.  Talk with your doctor about using more than one strategy at the same time, such as taking medicines while you are also going to in-person counseling. This can help make  quitting easier. What things can I do to make it easier to quit? Quitting smoking might feel very hard at first, but there is a lot that you can do to make it easier. Take these steps:  Talk to your family and friends. Ask them to support and encourage you.  Call phone quitlines, reach out to support groups, or work with a Social worker.  Ask people who smoke to not smoke around you.  Avoid places that make you want (trigger) to smoke, such as: ? Bars. ? Parties. ? Smoke-break areas at work.  Spend time with people who do not smoke.  Lower the stress in your life. Stress can make you want to smoke. Try these things to help your stress: ? Getting regular exercise. ? Deep-breathing exercises. ? Yoga. ? Meditating. ? Doing a body scan. To do this, close your eyes, focus on one area of your body at a time from head to toe, and notice which parts of your body are tense. Try to relax the muscles in those areas.  Download or buy apps on your mobile phone or tablet that can help you stick to your quit plan. There are many free apps, such as QuitGuide from the [employer] Office manager for Disease Control and Prevention). You can find more support from smokefree.gov and other websites.  This information is not intended to replace advice given to you by your health care provider. Make sure you discuss any questions you have with your health care provider. Document Released: 08/28/2009 Document  Revised: 06/29/2016 Document Reviewed: 03/18/2015 Elsevier Interactive Patient Education  [employer].    Meniscus Injury, Arthroscopy Arthroscopy is a surgical procedure that involves the use of a small scope that has a camera and surgical instruments on the end (arthroscope). An arthroscope can be used to repair your meniscus injury.  LET [facility] CARE PROVIDER KNOW ABOUT:  Any allergies you have.  All medicines you are taking, including vitamins, herbs, eyedrops, creams, and over-the-counter  medicines.  Any recent colds or infections you have had or currently have.  Previous problems you or members of your family have had with the use of anesthetics.  Any blood disorders or blood clotting problems you have.  Previous surgeries you have had.  Medical conditions you have. RISKS AND COMPLICATIONS Generally, this is a safe procedure. However, as with any procedure, problems can occur. Possible problems include:  Damage to nerves or blood vessels.  Excess bleeding.  Blood clots.  Infection. BEFORE THE PROCEDURE  Do not eat or drink for 6-8 hours before the procedure.  Take medicines as directed by your surgeon. Ask your surgeon about changing or stopping your regular medicines.  You may have lab tests the morning of surgery. PROCEDURE  You will be given one of the following:   A medicine that numbs the area (local anesthesia).  A medicine that makes you go to sleep (general anesthesia).  A medicine injected into your spine that numbs your body below the waist (spinal anesthesia). Most often, several small cuts (incisions) are made in the knee. The arthroscope and instruments go into the incisions to repair the damage. The torn portion of the meniscus is removed.   AFTER THE PROCEDURE  You will be taken to the recovery area where your progress will be monitored. When you are awake, stable, and taking fluids without complications, you will be allowed to go home. This is usually the same day. A torn or stretched ligament (ligament sprain) may take 6-8 weeks to heal.   It takes about the 4-6 WEEKS if your surgeon removed a torn meniscus.  A repaired meniscus may require 6-12 weeks of recovery time.  A torn ligament needing reconstructive surgery may take 6-12 months to heal fully.   This information is not intended to replace advice given to you by your health care provider. Make sure you discuss any questions you have with your health care provider. You have  decided to proceed with operative arthroscopy of the knee. You have decided not to continue with nonoperative measures such as but not limited to oral medication, weight loss, activity modification, physical therapy, bracing, or injection.  We will perform operative arthroscopy of the knee. Some of the risks associated with arthroscopic surgery of the knee include but are not limited to Bleeding Infection Swelling Stiffness Blood clot Pain  If you're not comfortable with these risks and would like to continue with nonoperative treatment please let Dr. Aline Brochure know prior to your surgery.   Document Released: 10/29/2000 Document Revised: 11/06/2013 Document Reviewed: 03/30/2013 Elsevier Interactive Patient Education 2016 [place] have decided to proceed with operative arthroscopy of the knee. You have decided not to continue with nonoperative measures such as but not limited to oral medication, weight loss, activity modification, physical therapy, bracing, or injection.  We will perform operative arthroscopy of the knee. Some of the risks associated with arthroscopic surgery of the knee include but are not limited to Bleeding Infection Swelling Stiffness Blood clot Pain  If you're  not comfortable with these risks and would like to continue with nonoperative treatment please let Dr. Aline Brochure know prior to your surgery.

## 2017-11-02 NOTE — Telephone Encounter (Signed)
-----   Message from Carole Civil, MD sent at 11/02/2017  8:59 AM EST ----- Need medical clearance from Dr Raechel Chute neuro

## 2017-11-09 NOTE — Telephone Encounter (Signed)
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

## 2017-11-10 NOTE — Telephone Encounter (Signed)
I have sent message to him, with note asking for clearance. Patient asking if we have gotten clearance yet, I have called her to advise we have not. Patient states she has called also.

## 2017-11-14 ENCOUNTER — Telehealth: Payer: Self-pay | Admitting: Radiology

## 2017-11-14 NOTE — Telephone Encounter (Signed)
Dr Estanislado Pandy wants you to call him back regarding patient, you have requested clearance from him his return numbers are [phone] or [phone], pager [phone]

## 2017-11-16 ENCOUNTER — Other Ambulatory Visit: Payer: Self-pay | Admitting: Orthopedic Surgery

## 2017-11-16 NOTE — Telephone Encounter (Signed)
Patient came by today, have you called Dr Estanislado Pandy back?    Dr Estanislado Pandy wants you to call him back regarding patient, you have requested clearance from him his return numbers are [phone] or [phone], pager [phone]

## 2017-11-16 NOTE — Telephone Encounter (Signed)
Dr Aline Brochure has sch her for Jan 17th I called her told her to expect call regarding a preop appointment   Will work on precert

## 2017-11-17 NOTE — Telephone Encounter (Signed)
Dr Aline Brochure spoke to Dr Estanislado Pandy  I called [employer] Ref #[id number] spoke to Lattie Haw no prior auth required for OP procedures.

## 2017-11-21 NOTE — Patient Instructions (Signed)
Stacy Hansen  11/21/2017     @PREFPERIOPPHARMACY @   Your procedure is scheduled on  12/01/2017   Report to [facility] at  615  A.M.  Call this number if you have problems the morning of surgery:  [phone]   Remember:  Do not eat food or drink liquids after midnight.  Take these medicines the morning of surgery with A SIP OF WATER  None   Do not wear jewelry, make-up or nail polish.  Do not wear lotions, powders, or perfumes, or deodorant.  Do not shave 48 hours prior to surgery.  Men may shave face and neck.  Do not bring valuables to the hospital.  [facility] is not responsible for any belongings or valuables.  Contacts, dentures or bridgework may not be worn into surgery.  Leave your suitcase in the car.  After surgery it may be brought to your room.  For patients admitted to the hospital, discharge time will be determined by your treatment team.  Patients discharged the day of surgery will not be allowed to drive home.   Name and phone number of your driver:   family Special instructions:  None  Please read over the following fact sheets that you were given. Anesthesia Post-op Instructions and Care and Recovery After Surgery      Knee Ligament Injury, Arthroscopy Arthroscopy is a surgical technique in which your health care provider examines your knee through a small, pencil-sized telescope (arthroscope). Often, repairs to injured ligaments can be done with instruments in the arthroscope. Arthroscopy is less invasive than open-knee surgery. Tell a health care provider about:  Any allergies you have.  All medicines you are taking, including vitamins, herbs, eye drops, creams, and over-the-counter medicines.  Any problems you or family members have had with anesthetic medicines.  Any blood disorders you have.  Any surgeries you have had.  Any medical conditions you have. What are the risks? Generally, this is a safe procedure. However, as  with any procedure, problems can occur. Possible problems include:  Infection.  Bleeding.  Stiffness.  What happens before the procedure?  Ask your health care provider about changing or stopping any regular medicines. Avoid taking aspirin or blood thinners as directed by your health care provider.  Do not eat or drink anything after midnight the night before surgery.  If you smoke, do not smoke for at least 2 weeks before your surgery.  Do not drink alcohol starting the day before your surgery.  Let your health care provider know if you develop a cold or any infection before your surgery.  Arrange for someone to drive you home after the surgery or after your hospital stay. Also arrange for someone to help you with activities during recovery. What happens during the procedure?  Small monitors will be put on your body. They are used to check your heart, blood pressure, and oxygen levels.  An IV access tube will be put into one of your veins. Medicine will be able to flow directly into your body through this IV tube.  You might be given a medicine to help you relax (sedative).  You will be given a medicine that makes you go to sleep (general anesthetic), and a breathing tube will be placed into your lungs during the procedure.  Several small incisions are made in your knee. Saline fluid is placed into one of the incisions to expand the knee and clear away any  blood in the knee.  Your health care provider will insert the arthroscope to examine the injured knee.  During arthroscopy, your health care provider may find a partial or complete tear in a ligament.  Tools can be inserted through the other incisions to repair the injured ligaments.  The incisions are then closed with absorbable stitches and covered with dressings. What happens after the procedure?  You will be taken to the recovery area where you will be monitored.  When you are awake, stable, and taking fluids  without problems, you will be allowed to go home. This information is not intended to replace advice given to you by your health care provider. Make sure you discuss any questions you have with your health care provider. Document Released: 10/29/2000 Document Revised: 04/08/2016 Document Reviewed: 06/13/2013 Elsevier Interactive Patient Education  2017 [place].  Arthroscopic Knee Ligament Repair, Care After This sheet gives you information about how to care for yourself after your procedure. Your health care provider may also give you more specific instructions. If you have problems or questions, contact your health care provider. What can I expect after the procedure? After the procedure, it is common to have:  Pain in your knee.  Bruising and swelling on your knee, calf, and ankle for 3-4 days.  Fatigue.  Follow these instructions at home: If you have a brace or immobilizer:  Wear the brace or immobilizer as told by your health care provider. Remove it only as told by your health care provider.  Loosen the splint or immobilizer if your toes tingle, become numb, or turn cold and blue.  Keep the brace or immobilizer clean. Bathing  Do not take baths, swim, or use a hot tub until your health care provider approves. Ask your health care provider if you can take showers.  Keep your bandage (dressing) dry until your health care provider says that it can be removed. Cover it and your brace or immobilizer with a watertight covering when you take a shower. Incision care  Follow instructions from your health care provider about how to take care of your incision. Make sure you: ? Wash your hands with soap and water before you change your bandage (dressing). If soap and water are not available, use hand sanitizer. ? Change your dressing as told by your health care provider. ? Leave stitches (sutures), skin glue, or adhesive strips in place. These skin closures may need to stay in place  for 2 weeks or longer. If adhesive strip edges start to loosen and curl up, you may trim the loose edges. Do not remove adhesive strips completely unless your health care provider tells you to do that.  Check your incision area every day for signs of infection. Check for: ? More redness, swelling, or pain. ? More fluid or blood. ? Warmth. ? Pus or a bad smell. Managing pain, stiffness, and swelling  If directed, put ice on the affected area. ? If you have a removable brace or immobilizer, remove it as told by your health care provider. ? Put ice in a plastic bag. ? Place a towel between your skin and the bag or between your brace or immobilizer and the bag. ? Leave the ice on for 20 minutes, 2-3 times a day.  Move your toes often to avoid stiffness and to lessen swelling.  Raise (elevate) the injured area above the level of your heart while you are sitting or lying down. Driving  Do not drive until your health  care provider approves. If you have a brace or immobilizer on your leg, ask your health care provider when it is safe for you to drive.  Do not drive or use heavy machinery while taking prescription pain medicine. Activity  Rest as directed. Ask your health care provider what activities are safe for you.  Do physical therapy exercises as told by your health care provider. Physical therapy will help you regain strength and motion in your knee.  Follow instructions from your health care provider about: ? When you may start motion exercises. ? When you may start riding a stationary bike and doing other low-impact activities. ? When you may start to jog and do other high-impact activities. Safety  Do not use the injured limb to support your body weight until your health care provider says that you can. Use crutches as told by your health care provider. General instructions  Do not use any products that contain nicotine or tobacco, such as cigarettes and e-cigarettes. These can  delay bone healing. If you need help quitting, ask your health care provider.  To prevent or treat constipation while you are taking prescription pain medicine, your health care provider may recommend that you: ? Drink enough fluid to keep your urine clear or pale yellow. ? Take over-the-counter or prescription medicines. ? Eat foods that are high in fiber, such as fresh fruits and vegetables, whole grains, and beans. ? Limit foods that are high in fat and processed sugars, such as fried and sweet foods.  Take over-the-counter and prescription medicines only as told by your health care provider.  Keep all follow-up visits as told by your health care provider. This is important. Contact a health care provider if:  You have more redness, swelling, or pain around an incision.  You have more fluid or blood coming from an incision.  Your incision feels warm to the touch.  You have a fever.  You have pain or swelling in your knee, and it gets worse.  You have pain that does not get better with medicine. Get help right away if:  You have trouble breathing.  You have pus or a bad smell coming from an incision.  You have numbness and tingling near the knee joint. Summary  After the procedure, it is common to have knee pain with bruising and swelling on your knee, calf, and ankle.  Icing your knee and raising your leg above the level of your heart will help control the pain and the swelling.  Do physical therapy exercises as told by your health care provider. Physical therapy will help you regain strength and motion in your knee. This information is not intended to replace advice given to you by your health care provider. Make sure you discuss any questions you have with your health care provider. Document Released: 08/22/2013 Document Revised: 10/26/2016 Document Reviewed: 10/26/2016 Elsevier Interactive Patient Education  2017 [place] Anesthesia, Adult General  anesthesia is the use of medicines to make a person "go to sleep" (be unconscious) for a medical procedure. General anesthesia is often recommended when a procedure:  Is long.  Requires you to be still or in an unusual position.  Is major and can cause you to lose blood.  Is impossible to do without general anesthesia.  The medicines used for general anesthesia are called general anesthetics. In addition to making you sleep, the medicines:  Prevent pain.  Control your blood pressure.  Relax your muscles.  Tell a health  care provider about:  Any allergies you have.  All medicines you are taking, including vitamins, herbs, eye drops, creams, and over-the-counter medicines.  Any problems you or family members have had with anesthetic medicines.  Types of anesthetics you have had in the past.  Any bleeding disorders you have.  Any surgeries you have had.  Any medical conditions you have.  Any history of heart or lung conditions, such as heart failure, sleep apnea, or chronic obstructive pulmonary disease (COPD).  Whether you are pregnant or may be pregnant.  Whether you use tobacco, alcohol, marijuana, or street drugs.  Any history of Armed forces logistics/support/administrative officer.  Any history of depression or anxiety. What are the risks? Generally, this is a safe procedure. However, problems may occur, including:  Allergic reaction to anesthetics.  Lung and heart problems.  Inhaling food or liquids from your stomach into your lungs (aspiration).  Injury to nerves.  Waking up during your procedure and being unable to move (rare).  Extreme agitation or a state of mental confusion (delirium) when you wake up from the anesthetic.  Air in the bloodstream, which can lead to stroke.  These problems are more likely to develop if you are having a major surgery or if you have an advanced medical condition. You can prevent some of these complications by answering all of your health care provider's  questions thoroughly and by following all pre-procedure instructions. General anesthesia can cause side effects, including:  Nausea or vomiting  A sore throat from the breathing tube.  Feeling cold or shivery.  Feeling tired, washed out, or achy.  Sleepiness or drowsiness.  Confusion or agitation.  What happens before the procedure? Staying hydrated Follow instructions from your health care provider about hydration, which may include:  Up to 2 hours before the procedure - you may continue to drink clear liquids, such as water, clear fruit juice, [place] coffee, and plain tea.  Eating and drinking restrictions Follow instructions from your health care provider about eating and drinking, which may include:  8 hours before the procedure - stop eating heavy meals or foods such as meat, fried foods, or fatty foods.  6 hours before the procedure - stop eating light meals or foods, such as toast or cereal.  6 hours before the procedure - stop drinking milk or drinks that contain milk.  2 hours before the procedure - stop drinking clear liquids.  Medicines  Ask your health care provider about: ? Changing or stopping your regular medicines. This is especially important if you are taking diabetes medicines or blood thinners. ? Taking medicines such as aspirin and ibuprofen. These medicines can thin your blood. Do not take these medicines before your procedure if your health care provider instructs you not to. ? Taking new dietary supplements or medicines. Do not take these during the week before your procedure unless your health care provider approves them.  If you are told to take a medicine or to continue taking a medicine on the day of the procedure, take the medicine with sips of water. General instructions   Ask if you will be going home the same day, the following day, or after a longer hospital stay. ? Plan to have someone take you home. ? Plan to have someone stay with you  for the first 24 hours after you leave the hospital or clinic.  For 3-6 weeks before the procedure, try not to use any tobacco products, such as cigarettes, chewing tobacco, and e-cigarettes.  You may  brush your teeth on the morning of the procedure, but make sure to spit out the toothpaste. What happens during the procedure?  You will be given anesthetics through a mask and through an IV tube in one of your veins.  You may receive medicine to help you relax (sedative).  As soon as you are asleep, a breathing tube may be used to help you breathe.  An anesthesia specialist will stay with you throughout the procedure. He or she will help keep you comfortable and safe by continuing to give you medicines and adjusting the amount of medicine that you get. He or she will also watch your blood pressure, pulse, and oxygen levels to make sure that the anesthetics do not cause any problems.  If a breathing tube was used to help you breathe, it will be removed before you wake up. The procedure may vary among health care providers and hospitals. What happens after the procedure?  You will wake up, often slowly, after the procedure is complete, usually in a recovery area.  Your blood pressure, heart rate, breathing rate, and blood oxygen level will be monitored until the medicines you were given have worn off.  You may be given medicine to help you calm down if you feel anxious or agitated.  If you will be going home the same day, your health care provider may check to make sure you can stand, drink, and urinate.  Your health care providers will treat your pain and side effects before you go home.  Do not drive for 24 hours if you received a sedative.  You may: ? Feel nauseous and vomit. ? Have a sore throat. ? Have mental slowness. ? Feel cold or shivery. ? Feel sleepy. ? Feel tired. ? Feel sore or achy, even in parts of your body where you did not have surgery. This information is not  intended to replace advice given to you by your health care provider. Make sure you discuss any questions you have with your health care provider. Document Released: 02/08/2008 Document Revised: 04/13/2016 Document Reviewed: 10/16/2015 Elsevier Interactive Patient Education  2018 [place] Anesthesia, Adult, Care After These instructions provide you with information about caring for yourself after your procedure. Your health care provider may also give you more specific instructions. Your treatment has been planned according to current medical practices, but problems sometimes occur. Call your health care provider if you have any problems or questions after your procedure. What can I expect after the procedure? After the procedure, it is common to have:  Vomiting.  A sore throat.  Mental slowness.  It is common to feel:  Nauseous.  Cold or shivery.  Sleepy.  Tired.  Sore or achy, even in parts of your body where you did not have surgery.  Follow these instructions at home: For at least 24 hours after the procedure:  Do not: ? Participate in activities where you could fall or become injured. ? Drive. ? Use heavy machinery. ? Drink alcohol. ? Take sleeping pills or medicines that cause drowsiness. ? Make important decisions or sign legal documents. ? Take care of children on your own.  Rest. Eating and drinking  If you vomit, drink water, juice, or soup when you can drink without vomiting.  Drink enough fluid to keep your urine clear or pale yellow.  Make sure you have little or no nausea before eating solid foods.  Follow the diet recommended by your health care provider. General instructions  Have  a responsible adult stay with you until you are awake and alert.  Return to your normal activities as told by your health care provider. Ask your health care provider what activities are safe for you.  Take over-the-counter and prescription medicines only  as told by your health care provider.  If you smoke, do not smoke without supervision.  Keep all follow-up visits as told by your health care provider. This is important. Contact a health care provider if:  You continue to have nausea or vomiting at home, and medicines are not helpful.  You cannot drink fluids or start eating again.  You cannot urinate after 8-12 hours.  You develop a skin rash.  You have fever.  You have increasing redness at the site of your procedure. Get help right away if:  You have difficulty breathing.  You have chest pain.  You have unexpected bleeding.  You feel that you are having a life-threatening or urgent problem. This information is not intended to replace advice given to you by your health care provider. Make sure you discuss any questions you have with your health care provider. Document Released: 02/07/2001 Document Revised: 04/05/2016 Document Reviewed: 10/16/2015 Elsevier Interactive Patient Education  [employer].

## 2017-11-24 ENCOUNTER — Encounter (HOSPITAL_COMMUNITY)
Admission: RE | Admit: 2017-11-24 | Discharge: 2017-11-24 | Disposition: A | Discharge status: Home / Self Care | Payer: BLUE CROSS/BLUE SHIELD | Source: Ambulatory Visit | Attending: Orthopedic Surgery | Admitting: Orthopedic Surgery

## 2017-11-24 ENCOUNTER — Encounter (HOSPITAL_COMMUNITY): Payer: Self-pay

## 2017-11-24 ENCOUNTER — Other Ambulatory Visit: Payer: Self-pay

## 2017-11-24 ENCOUNTER — Encounter: Payer: Self-pay | Admitting: Orthopedic Surgery

## 2017-11-24 DIAGNOSIS — Z01812 Encounter for preprocedural laboratory examination: Secondary | ICD-10-CM | POA: Diagnosis not present

## 2017-11-24 DIAGNOSIS — Z01810 Encounter for preprocedural cardiovascular examination: Secondary | ICD-10-CM | POA: Diagnosis not present

## 2017-11-24 HISTORY — DX: Crohn's disease of large intestine without complications: K50.10

## 2017-11-24 HISTORY — DX: Fibromyalgia: M79.7

## 2017-11-24 HISTORY — DX: Headache: R51

## 2017-11-24 HISTORY — DX: Headache, unspecified: R51.9

## 2017-11-24 LAB — SURGICAL PCR SCREEN
MRSA, PCR: NEGATIVE
Staphylococcus aureus: NEGATIVE

## 2017-11-24 LAB — CBC WITH DIFFERENTIAL/PLATELET
Basophils Absolute: 0 10*3/uL (ref 0.0–0.1)
Basophils Relative: 1 %
EOS ABS: 0.2 10*3/uL (ref 0.0–0.7)
EOS PCT: 2 %
HEMATOCRIT: 40.6 % (ref 36.0–46.0)
Hemoglobin: 12.8 g/dL (ref 12.0–15.0)
Lymphocytes Relative: 35 %
Lymphs Abs: 2.6 10*3/uL (ref 0.7–4.0)
MCH: 30 pg (ref 26.0–34.0)
MCHC: 31.5 g/dL (ref 30.0–36.0)
MCV: 95.1 fL (ref 78.0–100.0)
MONO ABS: 0.4 10*3/uL (ref 0.1–1.0)
MONOS PCT: 6 %
NEUTROS ABS: 4.2 10*3/uL (ref 1.7–7.7)
Neutrophils Relative %: 56 %
PLATELETS: 265 10*3/uL (ref 150–400)
RBC: 4.27 MIL/uL (ref 3.87–5.11)
RDW: 14.2 % (ref 11.5–15.5)
WBC: 7.3 10*3/uL (ref 4.0–10.5)

## 2017-11-24 LAB — COMPREHENSIVE METABOLIC PANEL
ALT: 14 U/L (ref 14–54)
ANION GAP: 11 (ref 5–15)
AST: 21 U/L (ref 15–41)
Albumin: 4.2 g/dL (ref 3.5–5.0)
Alkaline Phosphatase: 61 U/L (ref 38–126)
BILIRUBIN TOTAL: 0.2 mg/dL — AB (ref 0.3–1.2)
BUN: 11 mg/dL (ref 6–20)
CHLORIDE: 104 mmol/L (ref 101–111)
CO2: 25 mmol/L (ref 22–32)
Calcium: 9.1 mg/dL (ref 8.9–10.3)
Creatinine, Ser: 0.78 mg/dL (ref 0.44–1.00)
GFR calc Af Amer: >60 mL/min (ref >60)
GFR calc non Af Amer: >60 mL/min (ref >60)
GLUCOSE: 96 mg/dL (ref 65–99)
POTASSIUM: 3.9 mmol/L (ref 3.5–5.1)
Sodium: 140 mmol/L (ref 135–145)
TOTAL PROTEIN: 7 g/dL (ref 6.5–8.1)

## 2017-11-30 NOTE — H&P (Signed)
Outpatient history and physical  Patient ID: Stacy Hansen, female   DOB: Aug 04, 1951, 67 y.o.   MRN: [id number]       Chief Complaint  Patient presents with  . Knee Pain      left      67 year old female presents for evaluation for probable arthroscopy nonradiating dull aching left knee for pain medial joint line.  Patient fell in 2017 since that time she said progressively increasing and debilitating left knee pain over the medial side of the joint.  Since she developed pain on the medial side of the joint she has also noticed that she has been limping and she has had some back pain with some calf cramping and occasionally the left leg will fall asleep.   She complains of decreased range of motion of the left knee intermittent swelling with weightbearing.  Her medications include Tylenol and hydrocodone for pain   She had an MRI and plain films and she has a torn medial lateral meniscus and a medial compartment left knee.  She has not improved she would like to have surgery to try to improve her function and decrease her pain         Review of Systems  HENT:       Headache  Occasionally, worked up   Respiratory: Negative for shortness of breath.   Cardiovascular: Negative for chest pain.  Musculoskeletal:       Cramp left calf   Neurological: Negative for tingling.  Psychiatric/Behavioral: The patient is nervous/anxious.     Active Medications      Current Meds  Medication Sig  . acetaminophen (TYLENOL) 500 MG tablet Take 500 mg by mouth every 6 (six) hours as needed for moderate pain.  Marland Kitchen albuterol (PROVENTIL HFA;VENTOLIN HFA) 108 (90 BASE) MCG/ACT inhaler Inhale 2 puffs into the lungs every 6 (six) hours as needed for wheezing or shortness of breath.  Marland Kitchen aspirin 325 MG tablet Take 1 tablet (325 mg total) by mouth daily.  . butalbital-acetaminophen-caffeine (FIORICET, ESGIC) 50-325-40 MG per tablet Take 1 tablet by mouth every 6 (six) hours as needed for headache.  .  carboxymethylcellulose (REFRESH PLUS) 0.5 % SOLN Place 1 drop into both eyes 3 (three) times daily as needed (dry/itching eyes).   . Cholecalciferol (VITAMIN D3) 1000 units CAPS Take 1,000 Units by mouth.   . cyclobenzaprine (FLEXERIL) 5 MG tablet Take 1 tablet (5 mg total) by mouth 2 (two) times daily as needed for muscle spasms.  Marland Kitchen LORazepam (ATIVAN) 0.5 MG tablet Take 0.5 mg by mouth daily as needed for anxiety.  Marland Kitchen omeprazole (PRILOSEC) 40 MG capsule Take 1 capsule (40 mg total) by mouth daily.  . vitamin B-12 (CYANOCOBALAMIN) 1000 MCG tablet Take 1,000 mcg by mouth daily.            Past Medical History:  Diagnosis Date  . Anxiety    . Atypical chest pain    . Brain aneurysm    . Chronic abdominal pain    . Crohn disease ([place])    . Depression    . GERD (gastroesophageal reflux disease)    . 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 History of fibromyalgia    . Hyperplastic colon polyp    . Inflammatory bowel disease    . Panic attacks    . Stroke ([place])    . Tobacco abuse           Past Surgical History:  Procedure Laterality Date  . CATARACT EXTRACTION Bilateral    . IR GENERIC HISTORICAL   07/28/2016    IR RADIOLOGIST EVAL & MGMT 07/28/2016 [facility]  . ORIF ULNAR FRACTURE Left    . ROTATOR CUFF REPAIR Left    . TONSILLECTOMY      . TUBAL LIGATION      . VAGINAL HYSTERECTOMY        has her ovaries  . VARICOSE VEIN SURGERY Right           Family History  Problem Relation Age of Onset  . Diabetes Mother    . Kidney failure Mother    . Stroke Father    . Colon cancer Brother          in his 38's  . Diabetes Brother    . Diabetes Sister          x 2    Social History         Tobacco Use  . Smoking status: Current Every Day Smoker      Packs/day: 0.25      Years: 20.00      Pack years: 5.00      Types: Cigarettes  . Smokeless tobacco: Never Used  Substance Use Topics  . Alcohol use: No      Alcohol/week: 0.0 oz  . 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 Dexamethasone        halluncinations  . Ciprofloxacin Other (See Comments)      Unknown reaction per pt  . Meperidine Hcl Nausea And Vomiting and Other (See Comments)      Causes blood pressure to drop when taken with Phenergan through IV  . Opium Other (See Comments)      Causes blood pressure to drop when taken with Phenergan through IV  . Promethazine Hcl Nausea And Vomiting and Other (See Comments)      Causes blood pressure to drop when taken with Demeral via IV  . Propoxyphene N-Acetaminophen Other (See Comments)      "pass out"      BP 129/76   Pulse 75   Ht 5' 3"  (1.6 m)   Wt 116 lb (52.6 kg)   BMI 20.55 kg/m      Physical Exam  Constitutional: She is oriented to person, place, and time. She appears well-developed and well-nourished.  Musculoskeletal:       Right knee: She exhibits no effusion.       Left knee: She exhibits no effusion.  Neurological: She is alert and oriented to person, place, and time. Gait abnormal.  Abnormal gait since pain started in her left knee with some back pain pain in the back of the knee and some cramping left calf  Psychiatric: She has a normal mood and affect. Judgment normal.  Vitals reviewed.     Right Knee Exam    Muscle Strength  The patient has normal right knee strength.   Tenderness  The patient is experiencing no tenderness.    Range of Motion  Extension: normal  Flexion: normal    Tests  McMurray:  Medial - negative Lateral - negative Varus: negative Valgus: negative Drawer:  Anterior - negative    Posterior - negative   Other  Erythema: absent Scars: absent Sensation: normal Pulse: present Swelling: none Effusion: no effusion present     Left  Knee Exam    Muscle Strength  The patient has normal left knee strength.   Tenderness  The patient is experiencing tenderness in the medial joint line, lateral joint line and  patella.   Range of Motion  Extension: normal  Flexion:  90 normal    Tests  McMurray:  Medial - positive Lateral - negative Varus: negative Valgus: negative Drawer:  Anterior - negative     Posterior - negative   Other  Erythema: absent Scars: absent Sensation: normal Pulse: present Swelling: none Effusion: no effusion present             Medical decision-making   My interpretation of the    Imaging:    Plain film shows osteoarthritis medial compartment left knee with normal alignment no sclerosis or osteophyte formation   MRI images show torn medial meniscus torn lateral meniscus and tendinosis in the extensor mechanism quadriceps and patellar tendon diffuse cartilage loss medial compartment without exposed bone   reports included     FINDINGS: No evidence of fracture or dislocation. There is a small suprapatellar effusion. No evidence of arthropathy or other focal bone abnormality. Soft tissues are unremarkable.   IMPRESSION: Small suprapatellar joint effusion without underlying osseous abnormality.     Electronically Signed   By: Kristopher Oppenheim M.D.   On: 10/09/2017 09:58   The MRI report is recorded in the record from an outside facility impression was medial and lateral meniscal tears extensor tendinosis without tear large effusion and mild chondromalacia in the medial patellar facet moderate cartilage loss medial femoral condyle central weightbearing surface Encounter Diagnoses  Name Primary?  . Derangement of posterior horn of medial meniscus of right knee Yes  . Meniscus, lateral, derangement, right    . Primary osteoarthritis of left knee          Left knee arthroscopy medial and lateral meniscectomy   We discussed 4-6 weeks of recovery time including therapy and time out of work   The procedure has been fully reviewed with the patient; The risks and benefits of surgery have been discussed and explained and understood. Alternative treatment  has also been reviewed, questions were encouraged and answered. The postoperative plan is also been reviewed.

## 2017-12-01 ENCOUNTER — Ambulatory Visit (HOSPITAL_COMMUNITY): Payer: BLUE CROSS/BLUE SHIELD | Admitting: Anesthesiology

## 2017-12-01 ENCOUNTER — Ambulatory Visit (HOSPITAL_COMMUNITY)
Admission: RE | Admit: 2017-12-01 | Discharge: 2017-12-01 | Disposition: A | Discharge status: Home / Self Care | Payer: BLUE CROSS/BLUE SHIELD | Source: Ambulatory Visit | Attending: Orthopedic Surgery | Admitting: Orthopedic Surgery

## 2017-12-01 ENCOUNTER — Encounter (HOSPITAL_COMMUNITY)
Admission: RE | Disposition: A | Discharge status: Home / Self Care | Payer: Self-pay | Source: Ambulatory Visit | Attending: Orthopedic Surgery

## 2017-12-01 DIAGNOSIS — F410 Panic disorder [episodic paroxysmal anxiety] without agoraphobia: Secondary | ICD-10-CM | POA: Insufficient documentation

## 2017-12-01 DIAGNOSIS — M23242 Derangement of anterior horn of lateral meniscus due to old tear or injury, left knee: Secondary | ICD-10-CM | POA: Diagnosis not present

## 2017-12-01 DIAGNOSIS — Z881 Allergy status to other antibiotic agents status: Secondary | ICD-10-CM | POA: Diagnosis not present

## 2017-12-01 DIAGNOSIS — Z888 Allergy status to other drugs, medicaments and biological substances status: Secondary | ICD-10-CM | POA: Diagnosis not present

## 2017-12-01 DIAGNOSIS — Z86010 Personal history of colonic polyps: Secondary | ICD-10-CM | POA: Insufficient documentation

## 2017-12-01 DIAGNOSIS — I739 Peripheral vascular disease, unspecified: Secondary | ICD-10-CM | POA: Insufficient documentation

## 2017-12-01 DIAGNOSIS — I671 Cerebral aneurysm, nonruptured: Secondary | ICD-10-CM | POA: Insufficient documentation

## 2017-12-01 DIAGNOSIS — Z79899 Other long term (current) drug therapy: Secondary | ICD-10-CM | POA: Insufficient documentation

## 2017-12-01 DIAGNOSIS — M23342 Other meniscus derangements, anterior horn of lateral meniscus, left knee: Secondary | ICD-10-CM

## 2017-12-01 DIAGNOSIS — F419 Anxiety disorder, unspecified: Secondary | ICD-10-CM | POA: Diagnosis not present

## 2017-12-01 DIAGNOSIS — K219 Gastro-esophageal reflux disease without esophagitis: Secondary | ICD-10-CM | POA: Insufficient documentation

## 2017-12-01 DIAGNOSIS — M797 Fibromyalgia: Secondary | ICD-10-CM | POA: Insufficient documentation

## 2017-12-01 DIAGNOSIS — F17210 Nicotine dependence, cigarettes, uncomplicated: Secondary | ICD-10-CM | POA: Insufficient documentation

## 2017-12-01 DIAGNOSIS — M23222 Derangement of posterior horn of medial meniscus due to old tear or injury, left knee: Secondary | ICD-10-CM | POA: Insufficient documentation

## 2017-12-01 DIAGNOSIS — M23322 Other meniscus derangements, posterior horn of medial meniscus, left knee: Secondary | ICD-10-CM

## 2017-12-01 DIAGNOSIS — K5090 Crohn's disease, unspecified, without complications: Secondary | ICD-10-CM | POA: Insufficient documentation

## 2017-12-01 DIAGNOSIS — F329 Major depressive disorder, single episode, unspecified: Secondary | ICD-10-CM | POA: Diagnosis not present

## 2017-12-01 DIAGNOSIS — Z7982 Long term (current) use of aspirin: Secondary | ICD-10-CM | POA: Diagnosis not present

## 2017-12-01 DIAGNOSIS — Z8673 Personal history of transient ischemic attack (TIA), and cerebral infarction without residual deficits: Secondary | ICD-10-CM | POA: Insufficient documentation

## 2017-12-01 HISTORY — PX: KNEE ARTHROSCOPY WITH MEDIAL MENISECTOMY: SHX5651

## 2017-12-01 SURGERY — ARTHROSCOPY, KNEE, WITH MEDIAL MENISCECTOMY
Anesthesia: General | Laterality: Left

## 2017-12-01 MED ORDER — FENTANYL CITRATE (PF) 100 MCG/2ML IJ SOLN
INTRAMUSCULAR | Status: DC | PRN
  Administered 2017-12-01: 50 ug via INTRAVENOUS
  Administered 2017-12-01: 25 ug via INTRAVENOUS

## 2017-12-01 MED ORDER — SODIUM CHLORIDE 0.9 % IJ SOLN
INTRAMUSCULAR | Status: AC
  Filled 2017-12-01: qty 20

## 2017-12-01 MED ORDER — BUPIVACAINE-EPINEPHRINE (PF) 0.5% -1:200000 IJ SOLN
INTRAMUSCULAR | Status: DC | PRN
  Administered 2017-12-01: 30 mL

## 2017-12-01 MED ORDER — LACTATED RINGERS IV SOLN
INTRAVENOUS | Status: DC
  Administered 2017-12-01: 09:00:00 via INTRAVENOUS

## 2017-12-01 MED ORDER — SUCCINYLCHOLINE CHLORIDE 20 MG/ML IJ SOLN
INTRAMUSCULAR | Status: AC
  Filled 2017-12-01: qty 1

## 2017-12-01 MED ORDER — EPHEDRINE SULFATE 50 MG/ML IJ SOLN
INTRAMUSCULAR | Status: DC | PRN
  Administered 2017-12-01: 5 mg via INTRAVENOUS

## 2017-12-01 MED ORDER — ARTIFICIAL TEARS OPHTHALMIC OINT
TOPICAL_OINTMENT | OPHTHALMIC | Status: AC
  Filled 2017-12-01: qty 3.5

## 2017-12-01 MED ORDER — SODIUM CHLORIDE 0.9 % IR SOLN
Status: DC | PRN
  Administered 2017-12-01 (×3): 3000 mL

## 2017-12-01 MED ORDER — ONDANSETRON HCL 4 MG/2ML IJ SOLN
4.0000 mg | Freq: Once | INTRAMUSCULAR | Status: AC
  Administered 2017-12-01: 4 mg via INTRAVENOUS
  Filled 2017-12-01: qty 2

## 2017-12-01 MED ORDER — SUGAMMADEX SODIUM 200 MG/2ML IV SOLN
INTRAVENOUS | Status: AC
  Filled 2017-12-01: qty 2

## 2017-12-01 MED ORDER — ROCURONIUM BROMIDE 50 MG/5ML IV SOLN
INTRAVENOUS | Status: AC
  Filled 2017-12-01: qty 1

## 2017-12-01 MED ORDER — POVIDONE-IODINE 10 % EX SWAB
2.0000 "application " | Freq: Once | CUTANEOUS | Status: AC
  Administered 2017-12-01: 2 via TOPICAL

## 2017-12-01 MED ORDER — CEFAZOLIN SODIUM-DEXTROSE 2-4 GM/100ML-% IV SOLN
2.0000 g | INTRAVENOUS | Status: AC
  Administered 2017-12-01: 2 g via INTRAVENOUS

## 2017-12-01 MED ORDER — EPINEPHRINE PF 1 MG/ML IJ SOLN
INTRAMUSCULAR | Status: AC
  Filled 2017-12-01: qty 4

## 2017-12-01 MED ORDER — BUPIVACAINE-EPINEPHRINE (PF) 0.5% -1:200000 IJ SOLN
INTRAMUSCULAR | Status: AC
  Filled 2017-12-01: qty 60

## 2017-12-01 MED ORDER — ONDANSETRON HCL 4 MG/2ML IJ SOLN
INTRAMUSCULAR | Status: AC
  Filled 2017-12-01: qty 2

## 2017-12-01 MED ORDER — CEFAZOLIN SODIUM-DEXTROSE 2-4 GM/100ML-% IV SOLN
INTRAVENOUS | Status: AC
  Filled 2017-12-01: qty 100

## 2017-12-01 MED ORDER — SUGAMMADEX SODIUM 500 MG/5ML IV SOLN
INTRAVENOUS | Status: AC
  Filled 2017-12-01: qty 5

## 2017-12-01 MED ORDER — CHLORHEXIDINE GLUCONATE 4 % EX LIQD: 60.0000 mL | Freq: Once | CUTANEOUS | Status: DC

## 2017-12-01 MED ORDER — SODIUM CHLORIDE 0.9 % IR SOLN
Status: DC | PRN
  Administered 2017-12-01: 1000 mL

## 2017-12-01 MED ORDER — HYDROCODONE-ACETAMINOPHEN 5-325 MG PO TABS: 1.0000 | ORAL_TABLET | ORAL | 0 refills | Status: DC | PRN

## 2017-12-01 MED ORDER — FENTANYL CITRATE (PF) 100 MCG/2ML IJ SOLN
25.0000 ug | INTRAMUSCULAR | Status: DC | PRN
  Administered 2017-12-01 (×2): 50 ug via INTRAVENOUS
  Filled 2017-12-01: qty 2

## 2017-12-01 MED ORDER — FENTANYL CITRATE (PF) 100 MCG/2ML IJ SOLN
INTRAMUSCULAR | Status: AC
  Filled 2017-12-01: qty 2

## 2017-12-01 MED ORDER — MIDAZOLAM HCL 2 MG/2ML IJ SOLN
1.0000 mg | INTRAMUSCULAR | Status: AC
  Administered 2017-12-01: 2 mg via INTRAVENOUS
  Filled 2017-12-01: qty 2

## 2017-12-01 MED ORDER — PROPOFOL 10 MG/ML IV BOLUS
INTRAVENOUS | Status: DC | PRN
  Administered 2017-12-01: 90 mg via INTRAVENOUS

## 2017-12-01 MED ORDER — PROPOFOL 10 MG/ML IV BOLUS
INTRAVENOUS | Status: AC
  Filled 2017-12-01: qty 20

## 2017-12-01 MED ORDER — ONDANSETRON HCL 4 MG/2ML IJ SOLN
4.0000 mg | Freq: Once | INTRAMUSCULAR | Status: AC
  Administered 2017-12-01: 4 mg via INTRAVENOUS

## 2017-12-01 MED ORDER — EPHEDRINE SULFATE 50 MG/ML IJ SOLN
INTRAMUSCULAR | Status: AC
  Filled 2017-12-01: qty 2

## 2017-12-01 MED ORDER — LIDOCAINE HCL (PF) 1 % IJ SOLN
INTRAMUSCULAR | Status: AC
  Filled 2017-12-01: qty 5

## 2017-12-01 SURGICAL SUPPLY — 51 items
BAG HAMPER (MISCELLANEOUS) ×3 IMPLANT
BANDAGE ELASTIC 6 LF NS (GAUZE/BANDAGES/DRESSINGS) ×3 IMPLANT
BLADE AGGRESSIVE PLUS 4.0 (BLADE) ×3 IMPLANT
BLADE SURG SZ11 CARB STEEL (BLADE) ×3 IMPLANT
BNDG CMPR MED 5X6 ELC HKLP NS (GAUZE/BANDAGES/DRESSINGS) ×1
CHLORAPREP W/TINT 26ML (MISCELLANEOUS) ×3 IMPLANT
CLOTH BEACON ORANGE TIMEOUT ST (SAFETY) ×3 IMPLANT
COOLER CRYO IC GRAV AND TUBE (ORTHOPEDIC SUPPLIES) ×3 IMPLANT
CUFF CRYO KNEE18X23 MED (MISCELLANEOUS) ×2 IMPLANT
CUFF TOURNIQUET SINGLE 24IN (TOURNIQUET CUFF) ×2 IMPLANT
DECANTER SPIKE VIAL GLASS SM (MISCELLANEOUS) ×6 IMPLANT
GAUZE SPONGE 4X4 12PLY STRL (GAUZE/BANDAGES/DRESSINGS) ×3 IMPLANT
GAUZE SPONGE 4X4 16PLY XRAY LF (GAUZE/BANDAGES/DRESSINGS) ×3 IMPLANT
GAUZE XEROFORM 5X9 LF (GAUZE/BANDAGES/DRESSINGS) ×3 IMPLANT
GLOVE BIOGEL PI IND STRL 6.5 (GLOVE) IMPLANT
GLOVE BIOGEL PI IND STRL 7.0 (GLOVE) ×1 IMPLANT
GLOVE BIOGEL PI INDICATOR 6.5 (GLOVE) ×2
GLOVE BIOGEL PI INDICATOR 7.0 (GLOVE) ×2
GLOVE SKINSENSE NS SZ8.0 LF (GLOVE) ×2
GLOVE SKINSENSE STRL SZ8.0 LF (GLOVE) ×1 IMPLANT
GLOVE SS N UNI LF 8.5 STRL (GLOVE) ×3 IMPLANT
GLOVE SURG SS PI 6.5 STRL IVOR (GLOVE) ×2 IMPLANT
GOWN STRL REUS W/ TWL LRG LVL3 (GOWN DISPOSABLE) ×1 IMPLANT
GOWN STRL REUS W/TWL LRG LVL3 (GOWN DISPOSABLE) ×3
GOWN STRL REUS W/TWL XL LVL3 (GOWN DISPOSABLE) ×3 IMPLANT
HLDR LEG FOAM (MISCELLANEOUS) ×1 IMPLANT
IV NS IRRIG 3000ML ARTHROMATIC (IV SOLUTION) ×8 IMPLANT
KIT BLADEGUARD II DBL (SET/KITS/TRAYS/PACK) ×3 IMPLANT
KIT ROOM TURNOVER AP CYSTO (KITS) ×3 IMPLANT
LEG HOLDER FOAM (MISCELLANEOUS) ×2
MANIFOLD NEPTUNE II (INSTRUMENTS) ×3 IMPLANT
MARKER SKIN DUAL TIP RULER LAB (MISCELLANEOUS) ×3 IMPLANT
NDL HYPO 18GX1.5 BLUNT FILL (NEEDLE) ×1 IMPLANT
NDL SPNL 18GX3.5 QUINCKE PK (NEEDLE) ×1 IMPLANT
NEEDLE HYPO 18GX1.5 BLUNT FILL (NEEDLE) ×3 IMPLANT
NEEDLE HYPO 21X1.5 SAFETY (NEEDLE) ×3 IMPLANT
NEEDLE SPNL 18GX3.5 QUINCKE PK (NEEDLE) ×3 IMPLANT
NS IRRIG 1000ML POUR BTL (IV SOLUTION) ×3 IMPLANT
PACK ARTHRO LIMB DRAPE STRL (MISCELLANEOUS) ×3 IMPLANT
PAD ABD 5X9 TENDERSORB (GAUZE/BANDAGES/DRESSINGS) ×3 IMPLANT
PAD ARMBOARD 7.5X6 YLW CONV (MISCELLANEOUS) ×3 IMPLANT
PADDING CAST COTTON 6X4 STRL (CAST SUPPLIES) ×3 IMPLANT
PROBE BIPOLAR 50 DEGREE SUCT (MISCELLANEOUS) ×2 IMPLANT
SET ARTHROSCOPY INST (INSTRUMENTS) ×3 IMPLANT
SET BASIN LINEN APH (SET/KITS/TRAYS/PACK) ×3 IMPLANT
SUT ETHILON 3 0 FSL (SUTURE) ×3 IMPLANT
SYR 30ML LL (SYRINGE) ×3 IMPLANT
SYRINGE 10CC LL (SYRINGE) ×3 IMPLANT
TUBE CONNECTING 12'X1/4 (SUCTIONS) ×3
TUBE CONNECTING 12X1/4 (SUCTIONS) ×6 IMPLANT
TUBING ARTHRO INFLOW-ONLY STRL (TUBING) ×3 IMPLANT

## 2017-12-01 NOTE — Transfer of Care (Signed)
Immediate Anesthesia Transfer of Care Note  Patient: Stacy Hansen  Procedure(s) Performed: LEFT KNEE ARTHROSCOPY WITH PARTIAL MEDIAL AND LATERAL MENISECTOMY (Left )  Patient Location: PACU  Anesthesia Type:General  Level of Consciousness: awake and patient cooperative  Airway & Oxygen Therapy: Patient Spontanous Breathing and Patient connected to nasal cannula oxygen  Post-op Assessment: Report given to RN and Post -op Vital signs reviewed and stable  Post vital signs: Reviewed and stable  Last Vitals:  Vitals:   12/01/17 0920 12/01/17 0925  BP:    Pulse:    Resp: (!) 23 (!) 29  Temp:    SpO2: 97% 94%    Last Pain:  Vitals:   12/01/17 0840  TempSrc: Oral      Patients Stated Pain Goal: 6 (37/95/58 3167)  Complications: No apparent anesthesia complications

## 2017-12-01 NOTE — Interval H&P Note (Signed)
History and Physical Interval Note:  12/01/2017 9:42 AM  Stacy Hansen  has presented today for surgery, with the diagnosis of medial and lateral knee pain and torn meniscus medial and lateral left knee  The various methods of treatment have been discussed with the patient and family. After consideration of risks, benefits and other options for treatment, the patient has consented to  Procedure(s): LEFT KNEE ARTHROSCOPY WITH MEDIAL AND LATERAL MENISECTOMY (Left) as a surgical intervention .  The patient's history has been reviewed, patient examined, no change in status, stable for surgery.  I have reviewed the patient's chart and labs.  Questions were answered to the patient's satisfaction.     Arther Abbott

## 2017-12-01 NOTE — Discharge Instructions (Signed)
Knee Arthroscopy, Care After Refer to this sheet in the next few weeks. These instructions provide you with information about caring for yourself after your procedure. Your health care provider may also give you more specific instructions. Your treatment has been planned according to current medical practices, but problems sometimes occur. Call your health care provider if you have any problems or questions after your procedure. What can I expect after the procedure? After the procedure, it is common to have:  Soreness.  Pain.  Follow these instructions at home: Bathing  Do not take baths, swim, or use a hot tub until your health care provider approves. Incision care  There are many different ways to close and cover an incision, including stitches, skin glue, and adhesive strips. Follow your health care providers instructions about: ? Incision care. ? Bandage (dressing) changes and removal. ? Incision closure removal.  Check your incision area every day for signs of infection. Watch for: ? Redness, swelling, or pain. ? Fluid, blood, or pus. Activity  Avoid strenuous activities for as long as directed by your health care provider.  Return to your normal activities as directed by your health care provider. Ask your health care provider what activities are safe for you.  Perform range-of-motion exercises only as directed by your health care provider.  Do not lift anything that is heavier than 10 lb (4.5 kg).  Do not drive or operate heavy machinery while taking pain medicine.  If you were given crutches, use them as directed by your health care provider. Managing pain, stiffness, and swelling  If directed, apply ice to the injured area: ? Put ice in a plastic bag. ? Place a towel between your skin and the bag. ? Leave the ice on for 20 minutes, 2-3 times per day.  Raise the injured area above the level of your heart while you are sitting or lying down as directed by your  health care provider. General instructions  Keep all follow-up visits as directed by your health care provider. This is important.  Take medicines only as directed by your health care provider.  Do not use any tobacco products, including cigarettes, chewing tobacco, or electronic cigarettes. If you need help quitting, ask your health care provider.  If you were given compression stockings, wear them as directed by your health care provider. These stockings help prevent blood clots and reduce swelling in your legs. Contact a health care provider if:  You have severe pain with any movement of your knee.  You notice a bad smell coming from the incision or dressing.  You have redness, swelling, or pain at the site of your incision.  You have fluid, blood, or pus coming from your incision. Get help right away if:  You develop a rash.  You have a fever.  You have difficulty breathing or have shortness of breath.  You develop pain in your calves or in the back of your knee.  You develop chest pain.  You develop numbness or tingling in your leg or foot. This information is not intended to replace advice given to you by your health care provider. Make sure you discuss any questions you have with your health care provider. Document Released: 05/21/2005 Document Revised: 04/02/2016 Document Reviewed: 10/28/2014 Elsevier Interactive Patient Education  [employer].

## 2017-12-01 NOTE — Brief Op Note (Signed)
12/01/2017  10:46 AM  PATIENT:  Stacy Hansen  67 y.o. female  PRE-OPERATIVE DIAGNOSIS:  medial and lateral knee pain and torn meniscus medial and lateral left knee  POST-OPERATIVE DIAGNOSIS:  medial and lateral knee pain and torn meniscus medial and lateral left knee  PROCEDURE:  Procedure(s): LEFT KNEE ARTHROSCOPY WITH PARTIAL MEDIAL AND LATERAL MENISECTOMY (Left) 29880  Surgical findings   torn medial meniscus extensive tear involving the entire body and 75-80% of the posterior horn  Small anterior horn lateral meniscal tear  Chondral surfaces were intact as was the ACL   SURGEON:  Surgeon(s) and Role:    Carole Civil, MD - Primary  PHYSICIAN ASSISTANT: There were no assistance  General anesthesia  0 EBL    No blood was administered  No drains  30 cc Marcaine with epinephrine  No specimens  Counts were correct  No complications  Clean Case  Dragon Dictation      TOURNIQUET:  * Missing tourniquet times found for documented tourniquets in log: [id number] *    PLAN OF CARE: Discharge to home after PACU  PATIENT DISPOSITION:  PACU - hemodynamically stable.   Delay start of Pharmacological VTE agent (>24hrs) due to surgical blood loss or risk of bleeding: not applicable

## 2017-12-01 NOTE — Anesthesia Postprocedure Evaluation (Signed)
Anesthesia Post Note  Patient: Stacy Hansen  Procedure(s) Performed: LEFT KNEE ARTHROSCOPY WITH PARTIAL MEDIAL AND LATERAL MENISECTOMY (Left )  Patient location during evaluation: PACU Anesthesia Type: General Level of consciousness: awake and alert and oriented Pain management: pain level controlled Vital Signs Assessment: post-procedure vital signs reviewed and stable Respiratory status: spontaneous breathing Cardiovascular status: blood pressure returned to baseline Postop Assessment: adequate PO intake Anesthetic complications: no Comments: Experiencing some nausea, received zofran, Tolerating fluids and beginning to feel better.     Last Vitals:  Vitals:   12/01/17 1145 12/01/17 1200  BP: (!) 99/53   Pulse: 83 71  Resp: 15 16  Temp:    SpO2: (!) 87% 92%    Last Pain:  Vitals:   12/01/17 1145  TempSrc:   PainSc: 3                  Natahlia Hoggard

## 2017-12-01 NOTE — Anesthesia Preprocedure Evaluation (Signed)
Anesthesia Evaluation  Patient identified by MRN, date of birth, ID band Patient awake    Reviewed: Allergy & Precautions, NPO status , Patient's Chart, lab work & pertinent test results  Airway Mallampati: I  TM Distance: >3 FB Neck ROM: Full    Dental  (+) Edentulous Upper, Edentulous Lower   Pulmonary shortness of breath, Current Smoker,    breath sounds clear to auscultation       Cardiovascular + Peripheral Vascular Disease and + DOE   Rhythm:Regular Rate:Normal     Neuro/Psych  Headaches, Anxiety Depression Being followed for cerebral aneurysm. TIACVA    GI/Hepatic GERD  Medicated and Controlled,  Endo/Other    Renal/GU      Musculoskeletal  (+) Fibromyalgia -  Abdominal   Peds  Hematology   Anesthesia Other Findings   Reproductive/Obstetrics                             Anesthesia Physical Anesthesia Plan  ASA: III  Anesthesia Plan: General   Post-op Pain Management:    Induction: Intravenous  PONV Risk Score and Plan:   Airway Management Planned: LMA  Additional Equipment:   Intra-op Plan:   Post-operative Plan: Extubation in OR  Informed Consent: I have reviewed the patients History and Physical, chart, labs and discussed the procedure including the risks, benefits and alternatives for the proposed anesthesia with the patient or authorized representative who has indicated his/her understanding and acceptance.     Plan Discussed with:   Anesthesia Plan Comments:         Anesthesia Quick Evaluation

## 2017-12-01 NOTE — Op Note (Signed)
12/01/2017  10:46 AM  PATIENT:  Stacy Hansen  67 y.o. female  PRE-OPERATIVE DIAGNOSIS:  medial and lateral knee pain and torn meniscus medial and lateral left knee  POST-OPERATIVE DIAGNOSIS:  medial and lateral knee pain and torn meniscus medial and lateral left knee  PROCEDURE:  Procedure(s): LEFT KNEE ARTHROSCOPY WITH PARTIAL MEDIAL AND LATERAL MENISECTOMY (Left) 29880  Surgical findings   torn medial meniscus extensive tear involving the entire body and 75-80% of the posterior horn  Small anterior horn lateral meniscal tear  Chondral surfaces were intact as was the ACL  Details of procedure.  The patient was seen in preop the left knee was confirmed the surgical site marked chart review was completed patient was taken to surgery  2 g of Ancef were administered IV general anesthesia was performed in the supine position  Left knee and leg were prepped and draped sterilely  Timeout was completed  The lateral and medial portals were injected a 11 blade was used to make a skin incision over the lateral portal the scope was placed in the joint.  Diagnostic arthroscopy was completed  Findings are noted above  I started on the medial meniscus I have debrided with a shaver.  The posterior horn still had a large longitudinal tear in the white white zone which was resected with a straight biter.  Meniscal fragments were removed from the joint and a 50 degree wand was used to contour the meniscus, coagulation of the meniscal capsular junction was performed with the wand  I then turned my attention to the lateral meniscus and debrided it with a shaver and a wand  Repeat diagnostic arthroscopy was performed and no other lesions were found  The joint was irrigated meniscal fragments were removed portals were closed with 3-0 nylon suture.  The joint was injected with 30 cc of Marcaine  Sterile dressings Cryo/Cuff were applied     SURGEON:  Surgeon(s) and Role:    Carole Civil, MD - Primary  PHYSICIAN ASSISTANT: There were no assistance  General anesthesia  0 EBL    No blood was administered  No drains  30 cc Marcaine with epinephrine  No specimens  Counts were correct  No complications  Clean Case  Dragon Dictation      TOURNIQUET:  * Missing tourniquet times found for documented tourniquets in log: [id number] *    PLAN OF CARE: Discharge to home after PACU  PATIENT DISPOSITION:  PACU - hemodynamically stable.   Delay start of Pharmacological VTE agent (>24hrs) due to surgical blood loss or risk of bleeding: not applicable

## 2017-12-01 NOTE — Anesthesia Procedure Notes (Signed)
Procedure Name: LMA Insertion Date/Time: 12/01/2017 9:54 AM Performed by: Vista Deck, CRNA Pre-anesthesia Checklist: Patient identified, Patient being monitored, Emergency Drugs available, Timeout performed and Suction available Patient Re-evaluated:Patient Re-evaluated prior to induction Oxygen Delivery Method: Circle System Utilized Preoxygenation: Pre-oxygenation with 100% oxygen Induction Type: IV induction Ventilation: Mask ventilation without difficulty LMA: LMA inserted LMA Size: 3.0 Number of attempts: 1 Placement Confirmation: positive ETCO2 and breath sounds checked- equal and bilateral Tube secured with: Tape Dental Injury: Teeth and Oropharynx as per pre-operative assessment

## 2017-12-01 NOTE — Interval H&P Note (Signed)
History and Physical Interval Note:  12/01/2017 9:43 AM  Stacy Hansen  has presented today for surgery, with the diagnosis of medial and lateral knee pain and torn meniscus medial and lateral left knee  The various methods of treatment have been discussed with the patient and family. After consideration of risks, benefits and other options for treatment, the patient has consented to  Procedure(s): LEFT KNEE ARTHROSCOPY WITH MEDIAL AND LATERAL MENISECTOMY (Left) as a surgical intervention .  The patient's history has been reviewed, patient examined, no change in status, stable for surgery.  I have reviewed the patient's chart and labs.  Questions were answered to the patient's satisfaction.     Arther Abbott

## 2017-12-02 ENCOUNTER — Encounter (HOSPITAL_COMMUNITY): Payer: Self-pay | Admitting: Orthopedic Surgery

## 2017-12-08 DIAGNOSIS — Z9889 Other specified postprocedural states: Secondary | ICD-10-CM | POA: Insufficient documentation

## 2017-12-09 ENCOUNTER — Ambulatory Visit (INDEPENDENT_AMBULATORY_CARE_PROVIDER_SITE_OTHER): Payer: BLUE CROSS/BLUE SHIELD | Admitting: Orthopedic Surgery

## 2017-12-09 ENCOUNTER — Encounter: Payer: Self-pay | Admitting: Orthopedic Surgery

## 2017-12-09 VITALS — BP 121/71 | HR 78 | Ht 63.0 in | Wt 115.0 lb

## 2017-12-09 DIAGNOSIS — Z4889 Encounter for other specified surgical aftercare: Secondary | ICD-10-CM

## 2017-12-09 DIAGNOSIS — Z9889 Other specified postprocedural states: Secondary | ICD-10-CM

## 2017-12-09 MED ORDER — ACETAMINOPHEN-CODEINE #3 300-30 MG PO TABS: 1.0000 | ORAL_TABLET | ORAL | 0 refills | Status: DC | PRN

## 2017-12-09 MED ORDER — ACETAMINOPHEN-CODEINE #3 300-30 MG PO TABS: 1.0000 | ORAL_TABLET | Freq: Four times a day (QID) | ORAL | 0 refills | Status: DC | PRN

## 2017-12-09 NOTE — Progress Notes (Signed)
Patient ID: Stacy Hansen, female   DOB: 1951/08/06, 67 y.o.   MRN: [id number]  Chief Complaint  Patient presents with  . Post-op Follow-up    12/01/17 left knee arthroscopy     The patient is status post knee arthroscopy as described. They're doing well without any major complaints. Pain primarily medially and she says it hurts when the 2 knees touch together, incisions are clean, we removed the sutures  The patient will start physical therapy  Follow-up 4 weeks  Operative report   PRE-OPERATIVE DIAGNOSIS:  medial and lateral knee pain and torn meniscus medial and lateral left knee   POST-OPERATIVE DIAGNOSIS:  medial and lateral knee pain and torn meniscus medial and lateral left knee   PROCEDURE:  Procedure(s): LEFT KNEE ARTHROSCOPY WITH PARTIAL MEDIAL AND LATERAL MENISECTOMY (Left) 29880   Surgical findings    torn medial meniscus extensive tear involving the entire body and 75-80% of the posterior horn   Small anterior horn lateral meniscal tear   Chondral surfaces were intact as was the ACL   Follow-up 4 weeks continue home exercises, recommend Aspercreme and change to codeine No. 3  She will be out of work until I see her next time

## 2017-12-09 NOTE — Patient Instructions (Signed)
Continue home exercises return in a 4-week.  Start Aspercreme 3 times a day Place a pillow between the legs at night Take codeine for pain prescription has been sent to the pharmacy

## 2017-12-13 DIAGNOSIS — K219 Gastro-esophageal reflux disease without esophagitis: Secondary | ICD-10-CM | POA: Insufficient documentation

## 2017-12-13 DIAGNOSIS — E785 Hyperlipidemia, unspecified: Secondary | ICD-10-CM | POA: Insufficient documentation

## 2017-12-21 ENCOUNTER — Encounter: Payer: Self-pay | Admitting: Internal Medicine

## 2017-12-21 ENCOUNTER — Ambulatory Visit (INDEPENDENT_AMBULATORY_CARE_PROVIDER_SITE_OTHER): Payer: BLUE CROSS/BLUE SHIELD | Admitting: Internal Medicine

## 2017-12-21 VITALS — BP 116/62 | HR 86 | Ht 63.0 in | Wt 116.8 lb

## 2017-12-21 DIAGNOSIS — R918 Other nonspecific abnormal finding of lung field: Secondary | ICD-10-CM | POA: Diagnosis not present

## 2017-12-21 DIAGNOSIS — J449 Chronic obstructive pulmonary disease, unspecified: Secondary | ICD-10-CM

## 2017-12-21 DIAGNOSIS — R0609 Other forms of dyspnea: Secondary | ICD-10-CM

## 2017-12-21 DIAGNOSIS — F17200 Nicotine dependence, unspecified, uncomplicated: Secondary | ICD-10-CM

## 2017-12-21 NOTE — Patient Instructions (Addendum)
Dyspnea on exertion COPD COPD 0  - not sure if the veyr mild apical emphysema/copd on ct is causing shortness of breath - likely shortness of breath is due to physical deconditioning - if symptoms persists despite knee rehab then in 6 months can do CPST bike test   Multiple lung nodules on CT - per 2018 report these were small and no need for future followup   Tobacco use disorder - try to quit - recheck accurate smoking history  Followup 6 months or sooner if needed

## 2017-12-21 NOTE — Progress Notes (Signed)
Subjective:     Patient ID: Stacy Hansen, female   DOB: 1951-07-22, 67 y.o.   MRN: [id number]  HPI .o.   MRN: [id number]  OV Aug 2018: .:  Follow-up for Dyspnea on Exertion, Multiple Lung Nodules, & Tobacco Use Disorder.   Dyspnea on exertion: Patient's pulmonary function testing was normal. Patient does have mild apical emphysema on CT imaging. Patient wall test performed previously also demonstrates no significant hypoxia or desaturation. She reports her dyspnea is largely unchanged compared with her prior appointment. She denies any coughing or wheezing.  Multiple lung nodules: Seen on imaging in June 2017. There does not appear to be any appreciable change compared with CT imaging performed this may.  Tobacco use disorder: Previously patient was smoking 3-4 cigarettes per day and primarily smoking at work. She reports she has quit "cold [place]" in the past. She did get ill with Nicotine patches in the past. She is currently smoking 1/2 ppd. She believes they were 21 mg patches.   OV 12/21/2017  Chief Complaint  Patient presents with  . Advice Only    Former [place] pt.  Pt states she has been doing good since last visit with JN. Denies any complaints.      67 year old female.  Former IT consultant patient.  Husband Larri Brewton sees me for interstitial lung disease and he is awaiting evaluation by Dr. Roxan Hockey for surgical lung biopsy.  For [employer] the issues are  Mild dyspnea on exertion: This is chronic many year.  On extreme exertion after working 7 hours a day.  It is stable not worse no associated cough or wheezing.  Pulmonary function test previously was reported as normal.  CT scan is normal other than mild apical emphysema and multiple very tiny pulmonary nodules  Smoking history: This is very low-dose smoking.  Smoking multiple lung nodules: 2018 CT chest these nodules are some small again.  She is trying to quit.  She does not have enough of the smoking dose to qualify for lung  cancer CT scan screening  .  Radiologist recommended no further follow-up.    has a past medical history of Anxiety, Atypical chest pain, Brain aneurysm, Chronic abdominal pain, Crohn disease ([place]), Crohn's colitis ([place]), Depression, Fibromyalgia, GERD (gastroesophageal reflux disease), Headache, Hearing loss in right ear, History of fibromyalgia, Hyperplastic colon polyp, Inflammatory bowel disease, Panic attacks, Stroke ([place]), and Tobacco abuse.   reports that she has been smoking cigarettes.  She has a 5.00 pack-year smoking history. she has never used smokeless tobacco.  Past Surgical History:  Procedure Laterality Date  . CATARACT EXTRACTION Bilateral   . IR GENERIC HISTORICAL  07/28/2016   IR RADIOLOGIST EVAL & MGMT 07/28/2016 [facility]  . KNEE ARTHROSCOPY WITH MEDIAL MENISECTOMY Left 12/01/2017   Procedure: LEFT KNEE ARTHROSCOPY WITH PARTIAL MEDIAL AND LATERAL MENISECTOMY;  Surgeon: Carole Civil, MD;  Location: [facility];  Service: Orthopedics;  Laterality: Left;  . ORIF ULNAR FRACTURE Left   . ROTATOR CUFF REPAIR Left   . TONSILLECTOMY    . TUBAL LIGATION    . VAGINAL HYSTERECTOMY     has her ovaries  . VARICOSE VEIN SURGERY Right     Allergies  Allergen Reactions  . Dexamethasone Other (See Comments)    halluncinations  . Ciprofloxacin Other (See Comments)    Unknown reaction per pt  . Meperidine Hcl Nausea And Vomiting and Other (See Comments)    Causes blood pressure to drop when taken with Phenergan through  IV  . Opium Other (See Comments)    Causes blood pressure to drop when taken with Phenergan through IV  . Promethazine Hcl Nausea And Vomiting and Other (See Comments)    Causes blood pressure to drop when taken with Demeral via IV  . Propoxyphene N-Acetaminophen Other (See Comments)    "pass out"    Immunization History  Administered Date(s) Administered  . Influenza Split 09/01/2015  . Pneumococcal Polysaccharide-23 09/01/2015    Family History   Problem Relation Age of Onset  . Diabetes Mother   . Kidney failure Mother   . Stroke Father   . Colon cancer Brother        in his 13's  . Diabetes Brother   . Diabetes Sister        x 2     Current Outpatient Medications:  .  acetaminophen (TYLENOL) 500 MG tablet, Take 500 mg by mouth every 6 (six) hours as needed for moderate pain or headache. , Disp: , Rfl:  .  acetaminophen-codeine (TYLENOL #3) 300-30 MG tablet, Take 1 tablet by mouth every 4 (four) hours as needed for moderate pain., Disp: 42 tablet, Rfl: 0 .  albuterol (PROVENTIL HFA;VENTOLIN HFA) 108 (90 BASE) MCG/ACT inhaler, Inhale 2 puffs into the lungs every 6 (six) hours as needed for wheezing or shortness of breath., Disp: , Rfl:  .  aspirin 325 MG tablet, Take 1 tablet (325 mg total) by mouth daily., Disp: , Rfl:  .  carboxymethylcellulose (REFRESH PLUS) 0.5 % SOLN, Place 1 drop into both eyes 3 (three) times daily as needed (for dry/itching eyes). , Disp: , Rfl:  .  Cholecalciferol (VITAMIN D3) 1000 units CAPS, Take 1,000 Units by mouth. , Disp: , Rfl:  .  cycloSPORINE (RESTASIS) 0.05 % ophthalmic emulsion, Place 1 drop into both eyes 3 (three) times daily., Disp: , Rfl:  .  LORazepam (ATIVAN) 0.5 MG tablet, Take 0.5 mg by mouth at bedtime. , Disp: , Rfl:  .  vitamin B-12 (CYANOCOBALAMIN) 1000 MCG tablet, Take 1,000 mcg by mouth daily., Disp: , Rfl:  .  zolpidem (AMBIEN) 10 MG tablet, Take by mouth., Disp: , Rfl:  .  guaiFENesin-dextromethorphan (ROBITUSSIN DM) 100-10 MG/5ML syrup, Take 5 mLs by mouth every 4 (four) hours as needed for cough., Disp: , Rfl:    Review of Systems     Objective:   Physical Exam  Constitutional: She is oriented to person, place, and time. She appears well-developed and well-nourished. No distress.  HENT:  Head: Normocephalic and atraumatic.  Right Ear: External ear normal.  Left Ear: External ear normal.  Mouth/Throat: Oropharynx is clear and moist. No oropharyngeal exudate.  Eyes:  Conjunctivae and EOM are normal. Pupils are equal, round, and reactive to light. Right eye exhibits no discharge. Left eye exhibits no discharge. No scleral icterus.  Neck: Normal range of motion. Neck supple. No JVD present. No tracheal deviation present. No thyromegaly present.  Cardiovascular: Normal rate, regular rhythm, normal heart sounds and intact distal pulses. Exam reveals no gallop and no friction rub.  No murmur heard. Pulmonary/Chest: Effort normal and breath sounds normal. No respiratory distress. She has no wheezes. She has no rales. She exhibits no tenderness.  Abdominal: Soft. Bowel sounds are normal. She exhibits no distension and no mass. There is no tenderness. There is no rebound and no guarding.  Musculoskeletal: Normal range of motion. She exhibits no edema or tenderness.  Lymphadenopathy:    She has no cervical adenopathy.  Neurological:  She is alert and oriented to person, place, and time. She has normal reflexes. No cranial nerve deficit. She exhibits normal muscle tone. Coordination normal.  Skin: Skin is warm and dry. No rash noted. She is not diaphoretic. No erythema. No pallor.  Psychiatric: She has a normal mood and affect. Her behavior is normal. Judgment and thought content normal.  Vitals reviewed.  Vitals:   12/21/17 1111  BP: 116/62  Pulse: 86  SpO2: 96%  Weight: 116 lb 12.8 oz (53 kg)  Height: 5' 3"  (1.6 m)    Estimated body mass index is 20.69 kg/m as calculated from the following:   Height as of this encounter: 5' 3"  (1.6 m).   Weight as of this encounter: 116 lb 12.8 oz (53 kg).        Assessment:       ICD-10-CM   1. Dyspnea on exertion R06.09   2. COPD COPD 0  J44.9   3. Multiple lung nodules on CT R91.8   4. Tobacco use disorder F17.200        Plan:     Dyspnea on exertion COPD COPD 0  - not sure if the veyr mild apical emphysema/copd on ct is causing shortness of breath - likely shortness of breath is due to physical  deconditioning - if symptoms persists despite knee rehab then in 6 months can do CPST bike test   Multiple lung nodules on CT - per 2018 report these were small and no need for future followup   Tobacco use disorder - try to quit - recheck accurate smoking hx (5ppd could be wrong and if > 30ppd she could qualfy for CT screen)  Followup 6 months or sooner if needed      Dr. Brand Males, M.D., [facility].C.P Pulmonary and Critical Care Medicine Staff Physician, [place] Director - Interstitial Lung Disease  Program  [facility] [place] [facility] [place], [place], [address]  Pager: [phone], If no answer or between  15:00h - 7:00h: call [phone] Telephone: [phone]

## 2017-12-22 ENCOUNTER — Telehealth: Payer: Self-pay | Admitting: Orthopedic Surgery

## 2017-12-22 NOTE — Telephone Encounter (Signed)
I am not a nurse, but I called her to see if I can help. She has asked if it is normal for it to still have swelling. I have told her, yes, it is normal but she is very anxious about it. She has told me she has not had therapy and she has pain from her knee up into her back and down into her foot. I told her it may not be the knee, it may be from the back, she states it is not, it is the knee.   Since she is so anxious about it I put her on the schedule for tomorrow, there is a cancellation  To you FYI

## 2017-12-22 NOTE — Telephone Encounter (Signed)
Patient has questions about her knee and would like the nurse to call her at home.  Please call and advise

## 2017-12-23 ENCOUNTER — Encounter: Payer: Self-pay | Admitting: Orthopedic Surgery

## 2017-12-23 ENCOUNTER — Ambulatory Visit (INDEPENDENT_AMBULATORY_CARE_PROVIDER_SITE_OTHER): Payer: BLUE CROSS/BLUE SHIELD | Admitting: Orthopedic Surgery

## 2017-12-23 VITALS — BP 115/66 | HR 76 | Ht 63.0 in | Wt 115.0 lb

## 2017-12-23 DIAGNOSIS — M7052 Other bursitis of knee, left knee: Secondary | ICD-10-CM

## 2017-12-23 DIAGNOSIS — M2142 Flat foot [pes planus] (acquired), left foot: Secondary | ICD-10-CM

## 2017-12-23 DIAGNOSIS — M5432 Sciatica, left side: Secondary | ICD-10-CM

## 2017-12-23 DIAGNOSIS — Z9889 Other specified postprocedural states: Secondary | ICD-10-CM | POA: Diagnosis not present

## 2017-12-23 MED ORDER — CYCLOBENZAPRINE HCL 5 MG PO TABS: 5.0000 mg | ORAL_TABLET | Freq: Three times a day (TID) | ORAL | 0 refills | Status: DC | PRN

## 2017-12-23 MED ORDER — IBUPROFEN 400 MG PO TABS: 400.0000 mg | ORAL_TABLET | Freq: Three times a day (TID) | ORAL | 0 refills | Status: DC | PRN

## 2017-12-23 NOTE — Patient Instructions (Signed)
Place a heating pad on the lower back 3 times a day for 30 minutes  Start new medications  Apply ice over the area of injection 3 times a day for 30 minutes as well  Wear orthotics when walking

## 2017-12-23 NOTE — Progress Notes (Signed)
POST OP   Chief Complaint  Patient presents with  . Knee Pain  . Routine Post Op    left knee arthroscopy    Surgery date January 17 this is postop day #2   She has several complaints today.  She complains of left leg pain radiating from her left hip down to her left foot. She complains of pain on the medial aspect of the foot in the arch when walking. She complains of knee pain along the Pez bursal tendon.  No history of fever or trauma  BP 115/66   Pulse 76   Ht 5' 3"  (1.6 m)   Wt 115 lb (52.2 kg)   BMI 20.37 kg/m    She has tenderness over the Pez bursa mild knee effusion She can flex her knee to 125 degrees versus 140 on the right She has tenderness in the plantar aspect of her foot on the medial side in the arch She has mild tenderness in the lower back  Encounter Diagnoses  Name Primary?  . S/P left knee arthroscopy 12/01/17 Yes  . Sciatica of left side   . Flat foot (pes planus) (acquired), left foot   . Pes anserinus bursitis of left knee     So we are going to inject her Pes Bursa For her back pain we are going to use ibuprofen and a muscle relaxer We recommend foot orthotic  Procedure note right knee injection for bursitis  verbal consent was obtained to inject right knee PES BURSA  Timeout was completed to confirm the site of injection  The medications used were 40 mg of Depo-Medrol and 1% lidocaine 3 cc  Anesthesia was provided by ethyl chloride and the skin was prepped with alcohol.  After cleaning the skin with alcohol a 25-gauge needle was used to inject the right knee bursa.  There were no complications and a sterile bandage was applied   And I will follow up with her March 8

## 2017-12-23 NOTE — Progress Notes (Signed)
fmr o

## 2018-01-06 ENCOUNTER — Ambulatory Visit (INDEPENDENT_AMBULATORY_CARE_PROVIDER_SITE_OTHER): Payer: BLUE CROSS/BLUE SHIELD | Admitting: Orthopedic Surgery

## 2018-01-06 ENCOUNTER — Encounter: Payer: Self-pay | Admitting: Orthopedic Surgery

## 2018-01-06 VITALS — BP 143/96 | HR 77 | Ht 63.0 in | Wt 115.0 lb

## 2018-01-06 DIAGNOSIS — Z9889 Other specified postprocedural states: Secondary | ICD-10-CM

## 2018-01-06 DIAGNOSIS — M5432 Sciatica, left side: Secondary | ICD-10-CM

## 2018-01-06 DIAGNOSIS — M2142 Flat foot [pes planus] (acquired), left foot: Secondary | ICD-10-CM

## 2018-01-06 DIAGNOSIS — M7052 Other bursitis of knee, left knee: Secondary | ICD-10-CM

## 2018-01-06 NOTE — Patient Instructions (Signed)
Change out of work note to return to work on March 11

## 2018-01-06 NOTE — Progress Notes (Signed)
Postop visit.cc  Encounter Diagnoses  Name Primary?  . S/P left knee arthroscopy 12/01/17 Yes  . Sciatica of left side   . Flat foot (pes planus) (acquired), left foot   . Pes anserinus bursitis of left knee     67 year old female developed postoperative left sided sciatica with bursitis of the left knee and exacerbation of flatfoot deformity  We placed her in foot orthotics we injected her left knee she took ibuprofen for her lower back and sciatica she presents back for follow-up with improvement in the sciatica as well as the problems with the foot however she still has some residual on the left lower back down the posterior aspect of the left leg.  Her knee range of motion is nearly the same as the right side  There is no swelling she has some mild tenderness over the bursa of the left knee  She will continue with ibuprofen rest home exercises and follow-up with me on the eighth with a planned return to work date of 11 March

## 2018-01-20 ENCOUNTER — Ambulatory Visit (INDEPENDENT_AMBULATORY_CARE_PROVIDER_SITE_OTHER): Payer: BLUE CROSS/BLUE SHIELD | Admitting: Orthopedic Surgery

## 2018-01-20 ENCOUNTER — Encounter: Payer: Self-pay | Admitting: Orthopedic Surgery

## 2018-01-20 VITALS — BP 108/68 | HR 85 | Ht 63.0 in | Wt 115.0 lb

## 2018-01-20 DIAGNOSIS — M5432 Sciatica, left side: Secondary | ICD-10-CM

## 2018-01-20 DIAGNOSIS — Z9889 Other specified postprocedural states: Secondary | ICD-10-CM

## 2018-01-20 MED ORDER — IBUPROFEN 800 MG PO TABS: 800.0000 mg | ORAL_TABLET | Freq: Three times a day (TID) | ORAL | 1 refills | Status: DC | PRN

## 2018-01-20 MED ORDER — GABAPENTIN 100 MG PO CAPS: 100.0000 mg | ORAL_CAPSULE | Freq: Three times a day (TID) | ORAL | 2 refills | Status: DC

## 2018-01-20 NOTE — Progress Notes (Signed)
Chief Complaint  Patient presents with  . Follow-up    Recheck on left knee, DOS 12-01-17.    She returns in follow-up after left knee arthroscopy she had bursitis of the left knee which developed postoperatively.  She also developed postoperative pain in the left side of her lower back with sciatica.  She has not really improved in terms of the back pain still has some soreness along the medial side of the left knee although she is regained full range of motion there is no effusion she is still tender in that area  Lumbar spine is still tender as well  Recommend 5 weeks out of work 4 weeks of physical therapy ibuprofen 800 mg 3 times a day gabapentin 100 mg 3 times a day and in 5 weeks she will come back for reevaluation.

## 2018-01-24 ENCOUNTER — Telehealth: Payer: Self-pay | Admitting: Orthopedic Surgery

## 2018-01-24 DIAGNOSIS — Z9889 Other specified postprocedural states: Secondary | ICD-10-CM

## 2018-01-24 DIAGNOSIS — M5136 Other intervertebral disc degeneration, lumbar region: Secondary | ICD-10-CM

## 2018-01-24 NOTE — Telephone Encounter (Signed)
I spoke to patient , asked her to call PT she states she will do this, she has asked for someone to send information to [facility] for her disability, they need findings from the last visit or she will not get paid  Can you help with this?

## 2018-01-24 NOTE — Telephone Encounter (Signed)
Patient called asking if her physical therapy had been setup with [facility] in [place] yet. She wants to be sure that all the paperwork has been sent to them that they would need. She stated she has not heard anything from them and was doing a follow up.  She has asked to be called [phone].  Please call and advise

## 2018-01-24 NOTE — Telephone Encounter (Signed)
Looks like I missed the order. I have it in.

## 2018-01-30 ENCOUNTER — Ambulatory Visit: Payer: BLUE CROSS/BLUE SHIELD | Attending: Orthopedic Surgery | Admitting: Physical Therapy

## 2018-01-30 ENCOUNTER — Encounter: Payer: Self-pay | Admitting: Physical Therapy

## 2018-01-30 ENCOUNTER — Other Ambulatory Visit: Payer: Self-pay

## 2018-01-30 DIAGNOSIS — M542 Cervicalgia: Secondary | ICD-10-CM | POA: Diagnosis present

## 2018-01-30 DIAGNOSIS — M5442 Lumbago with sciatica, left side: Secondary | ICD-10-CM | POA: Diagnosis present

## 2018-01-30 NOTE — Therapy (Signed)
[facility] [place], [place], [address] Phone: [phone]   Fax:  [phone]  Physical Therapy Evaluation  Patient Details  Name: Stacy Hansen MRN: [id number] Date of Birth: [birth year] Referring Provider: Arther Abbott, MD   Encounter Date: 01/30/2018  PT End of Session - 01/30/18 0953    Visit Number  1    Number of Visits  18    Date for PT Re-Evaluation  03/13/18    PT Start Time  0954    PT Stop Time  1043    PT Time Calculation (min)  49 min    Activity Tolerance  Patient tolerated treatment well    Behavior During Therapy  [facility] for tasks assessed/performed       Past Medical History:  Diagnosis Date  . Anxiety   . Atypical chest pain   . Brain aneurysm   . Chronic abdominal pain   . Crohn disease ([place])   . Crohn's colitis ([place])   . Depression   . Fibromyalgia   . GERD (gastroesophageal reflux disease)   . Headache   . 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 History of fibromyalgia   . Hyperplastic colon polyp   . Inflammatory bowel disease   . Panic attacks   . Stroke ([place])   . Tobacco abuse     Past Surgical History:  Procedure Laterality Date  . CATARACT EXTRACTION Bilateral   . IR GENERIC HISTORICAL  07/28/2016   IR RADIOLOGIST EVAL & MGMT 07/28/2016 [facility]INTER[facility]  . KNEE ARTHROSCOPY WITH MEDIAL MENISECTOMY Left 12/01/2017   Procedure: LEFT KNEE ARTHROSCOPY WITH PARTIAL MEDIAL AND LATERAL MENISECTOMY;  Surgeon: Carole Civil, MD;  Location: [facility];  Service: Orthopedics;  Laterality: Left;  . ORIF ULNAR FRACTURE Left   . ROTATOR CUFF REPAIR Left   . TONSILLECTOMY    . TUBAL LIGATION    . VAGINAL HYSTERECTOMY     has her ovaries  . VARICOSE VEIN SURGERY Right     There were no vitals filed for this visit.   Subjective Assessment - 01/30/18 0954    Subjective  Patient had L knee surgery 12/01/17. Following the surgery she began having pain in her low back and  LLE from  gluteals to ankle. Patient states she has bursitis on medial L knee, but overall knee feels pretty good. Patient works at [employer] walking around for 7-8 hours. Right now limited to 4 hours standing/walking due to back/leg pain.    Pertinent History  L knee arthroscopy (partial med/lat meniscectomy); stroke (3 TIA - 2015/2016/2017), brain aneurysm, L RCR, Rod L lower arm    Limitations  Sitting    How long can you sit comfortably?  10 min    How long can you walk comfortably?  4 hours (up on feet)    Patient Stated Goals  Get out of pain.    Currently in Pain?  Yes    Pain Score  7     Pain Location  Back    Pain Orientation  Lower    Pain Descriptors / Indicators  Shooting    Pain Type  Acute pain    Pain Radiating Towards  LLE to ankle    Pain Onset  More than a month ago    Pain Frequency  Constant    Aggravating Factors   weightbearing    Pain Relieving Factors  supine    Effect of Pain on Daily Activities  unable to work at [employer] - 01/30/18 0001      Assessment   Medical Diagnosis  s/p L knee arthroscopy    Referring Provider  Arther Abbott, MD    Onset Date/Surgical Date  12/01/17    Next MD Visit  02/24/18      Precautions   Precautions  None      Balance Screen   Has the patient fallen in the past 6 months  No    Has the patient had a decrease in activity level because of a fear of falling?   No    Is the patient reluctant to leave their home because of a fear of falling?   No      Prior Function   Level of Independence  Independent    Vocation  Full time employment    Vocation Requirements  standing/walking on cement      Posture/Postural Control   Posture Comments  depressed R shoulder      ROM / Strength   AROM / PROM / Strength  AROM;Strength      AROM   Overall AROM Comments  Lumbar flexion hands to knees, ext WNL, Bil SB WFL but painful and tight Bil      Strength   Overall Strength Comments  Bil hip flex 4/5, hip ext  4/5 L, 4+/5 R; knee ext 4+/5, knee flex 4-/5, ankle DF 5/5      Palpation   Palpation comment  marked tenderness in L gluteals, along SIJ, lumbar paraspinals      Special Tests    Special Tests  Lumbar    Lumbar Tests  Slump Test;Prone Knee Bend Test;Straight Leg Raise      Slump test   Findings  Positive    Side  Left      Prone Knee Bend Test   Findings  Positive    Side  Left      Straight Leg Raise   Findings  Positive    Side   Left             Objective measurements completed on examination: See above findings.      [facility] Adult PT Treatment/Exercise - 01/30/18 0001      Modalities   Modalities  Electrical Stimulation;Moist Heat      Moist Heat Therapy   Number Minutes Moist Heat  15 Minutes    Moist Heat Location  Lumbar Spine      Electrical Stimulation   Electrical Stimulation Location  Bil lumbar glutes IFC 80-150 Hz x 15 min    Electrical Stimulation Goals  Pain             PT Education - 01/30/18 1548    Education provided  Yes    Education Details  educated on centralization of symptoms.    Person(s) Educated  Patient    Methods  Explanation    Comprehension  Verbalized understanding       PT Short Term Goals - 06/02/17 0856      PT SHORT TERM GOAL #1   Title  Independent with a HEP.    Time  2    Period  Weeks    Status  Achieved      PT SHORT TERM GOAL #2   Title  Bilateral active cervical rotation to 55 degrees.    Time  2    Period  Weeks    Status  On-going      PT SHORT TERM GOAL #3   Title  Bilateral active cervical SBing= 15 degrees.    Time  4    Period  Weeks    Status  On-going        PT Long Term Goals - 01/30/18 1551      PT LONG TERM GOAL #1   Title  Pt independent with HEP    Time  8    Period  Weeks    Status  New    Target Date  03/27/18      PT LONG TERM GOAL #2   Title  Patient able to peform ADLS with pain 5/32 or less in the back and LLE.    Time  8    Period  Weeks    Status  New       PT LONG TERM GOAL #3   Title  Patient able to sit for 30 min comfortably.    Time  8    Period  Weeks    Status  New      PT LONG TERM GOAL #4   Title  Patient to demo 5/5 BLE strength to improve function.    Time  8    Period  Weeks    Status  New             Plan - 01/30/18 1040    Clinical Impression Statement  Patient presents today for moderate complexity evaluation for lumbar DDD and Left knee pain s/p L knee arthroscopy. She has pain in the low back that radiates into LLE to ankle which centralizes in prone over one pillow. She is limited in sitting, standing and walking due to pain. She has pain with PA mobs at L5/S1 and L1/2 Bil. She also has weakness in BLE and has postural deficits as well.  Patient will benefit from PT to address these deficits.    History and Personal Factors relevant to plan of care:  L knee arthroscopy (partial med/lat meniscectomy); stroke (3 TIA - 2015/2016/2017), brain aneurysm, L RCR, Rod L lower arm, fibromyalgia, depression, chrone's disease    Clinical Presentation  Evolving    Clinical Decision Making  Moderate    Rehab Potential  Good    PT Frequency  2x / week    PT Duration  8 weeks    PT Treatment/Interventions  ADLs/Self Care Home Management;Cryotherapy;Electrical Stimulation;Ultrasound;Moist Heat;Therapeutic activities;Therapeutic exercise;Patient/family education;Passive range of motion;Manual techniques;Dry needling;Traction;Neuromuscular re-education    PT Next Visit Plan  work on centralizing symptoms, try traction; STW/MFR/DN to lumbar/glutes; modalities for pain.    Consulted and Agree with Plan of Care  Patient       Patient will benefit from skilled therapeutic intervention in order to improve the following deficits and impairments:  Decreased strength, Pain, Decreased activity tolerance, Decreased range of motion, Postural dysfunction  Visit Diagnosis: Acute left-sided low back pain with left-sided sciatica - Plan: PT plan  of care cert/re-cert, CANCELED: PT plan of care cert/re-cert     Problem List Patient Active Problem List   Diagnosis Date Noted  . S/P left knee arthroscopy 12/01/17 12/08/2017  . Derangement of posterior horn of medial meniscus of left knee   . Derangement of anterior horn of lateral meniscus of left knee   . Multiple pulmonary nodules 07/07/2016  . Dyspnea 07/07/2016  . TIA (transient ischemic attack)   . Left sided numbness 11/02/2014  . Tobacco use 11/02/2014  . Bradycardia 11/02/2014  .  Brain aneurysm 11/02/2014  . Crohn disease ([place]) 11/02/2014  . Fibromyalgia 11/02/2014  . Left-sided weakness 11/02/2014  . Chronic anxiety 11/02/2014  . CVA (cerebral infarction) 07/10/2013  . Dizziness 05/05/2012  . Anxiety state, unspecified 12/11/2009  . TOBACCO ABUSE 12/11/2009  . Subjective visual disturbance 12/11/2009  . Myalgia and myositis 12/11/2009  . PALPITATIONS 12/11/2009  . Precordial pain 12/11/2009    Madelyn Flavors PT 01/30/2018, 4:17 PM  [facility] [address] [place], [place], [address] Phone: [phone]   Fax:  [phone]  Name: JAIMEY FRANCHINI MRN: [id number] Date of Birth: 05/31/1951

## 2018-02-01 ENCOUNTER — Ambulatory Visit: Payer: BLUE CROSS/BLUE SHIELD | Admitting: Physical Therapy

## 2018-02-01 ENCOUNTER — Encounter: Payer: Self-pay | Admitting: Physical Therapy

## 2018-02-01 DIAGNOSIS — M5442 Lumbago with sciatica, left side: Secondary | ICD-10-CM

## 2018-02-01 NOTE — Therapy (Signed)
[facility] [place], [place], [address] Phone: [phone]   Fax:  [phone]  Physical Therapy Treatment  Patient Details  Name: Stacy Hansen MRN: [id number] Date of Birth: 1951-09-20 Referring Provider: Arther Abbott, MD   Encounter Date: 02/01/2018  PT End of Session - 02/01/18 1049    Visit Number  2    Number of Visits  18    Date for PT Re-Evaluation  03/13/18    PT Start Time  0952    PT Stop Time  1036    PT Time Calculation (min)  44 min    Activity Tolerance  Patient tolerated treatment well    Behavior During Therapy  [facility] for tasks assessed/performed       Past Medical History:  Diagnosis Date  . Anxiety   . Atypical chest pain   . Brain aneurysm   . Chronic abdominal pain   . Crohn disease ([place])   . Crohn's colitis ([place])   . Depression   . Fibromyalgia   . GERD (gastroesophageal reflux disease)   . Headache   . 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 History of fibromyalgia   . Hyperplastic colon polyp   . Inflammatory bowel disease   . Panic attacks   . Stroke ([place])   . Tobacco abuse     Past Surgical History:  Procedure Laterality Date  . CATARACT EXTRACTION Bilateral   . IR GENERIC HISTORICAL  07/28/2016   IR RADIOLOGIST EVAL & MGMT 07/28/2016 [facility]-INTERV RAD  . KNEE ARTHROSCOPY WITH MEDIAL MENISECTOMY Left 12/01/2017   Procedure: LEFT KNEE ARTHROSCOPY WITH PARTIAL MEDIAL AND LATERAL MENISECTOMY;  Surgeon: Carole Civil, MD;  Location: [facility];  Service: Orthopedics;  Laterality: Left;  . ORIF ULNAR FRACTURE Left   . ROTATOR CUFF REPAIR Left   . TONSILLECTOMY    . TUBAL LIGATION    . VAGINAL HYSTERECTOMY     has her ovaries  . VARICOSE VEIN SURGERY Right     There were no vitals filed for this visit.  Subjective Assessment - 02/01/18 1030    Subjective  Reports that her LBP is predominately in L low back but today is basically in L hip. Increased walking caused  increased pain down to L knee.    Pertinent History  L knee arthroscopy (partial med/lat meniscectomy); stroke (3 TIA - 2015/2016/2017), brain aneurysm, L RCR, Rod L lower arm    Limitations  Sitting    How long can you sit comfortably?  10 min    How long can you walk comfortably?  4 hours (up on feet)    Patient Stated Goals  Get out of pain.    Currently in Pain?  Yes    Pain Score  7     Pain Location  Hip    Pain Orientation  Left    Pain Descriptors / Indicators  Discomfort    Pain Type  Acute pain    Pain Onset  More than a month ago         [facility] PT Assessment - 02/01/18 0001      Assessment   Medical Diagnosis  s/p L knee arthroscopy    Onset Date/Surgical Date  12/01/17    Next MD Visit  02/24/18      Precautions   Precautions  None                  [facility] Adult PT  Treatment/Exercise - 02/01/18 0001      Exercises   Exercises  Lumbar      Lumbar Exercises: Stretches   Single Knee to Chest Stretch  Left;3 reps;30 seconds    Figure 4 Stretch  3 reps;30 seconds;Supine;Without overpressure      Lumbar Exercises: Supine   Ab Set  10 reps;5 seconds      Lumbar Exercises: Prone   Other Prone Lumbar Exercises  Prone over 2 pillows x3 min, prone over pillows with L roadkill x3 min    Other Prone Lumbar Exercises  POE with L roadkill x3 min      Modalities   Modalities  Electrical Stimulation;Moist Heat      Moist Heat Therapy   Number Minutes Moist Heat  15 Minutes    Moist Heat Location  Lumbar Spine      Electrical Stimulation   Electrical Stimulation Location  L low back/ hip    Electrical Stimulation Action  Pre-Mod    Electrical Stimulation Parameters  80-150 hz x15 min    Electrical Stimulation Goals  Pain             PT Education - 02/01/18 1045    Education provided  Yes    Education Details  HEP- SKTC, figure 4 stretch    Person(s) Educated  Patient    Methods  Explanation;Handout;Tactile cues;Verbal cues    Comprehension   Verbalized understanding       PT Short Term Goals - 06/02/17 0856      PT SHORT TERM GOAL #1   Title  Independent with a HEP.    Time  2    Period  Weeks    Status  Achieved      PT SHORT TERM GOAL #2   Title  Bilateral active cervical rotation to 55 degrees.    Time  2    Period  Weeks    Status  On-going      PT SHORT TERM GOAL #3   Title  Bilateral active cervical SBing= 15 degrees.    Time  4    Period  Weeks    Status  On-going        PT Long Term Goals - 01/30/18 1551      PT LONG TERM GOAL #1   Title  Pt independent with HEP    Time  8    Period  Weeks    Status  New    Target Date  03/27/18      PT LONG TERM GOAL #2   Title  Patient able to peform ADLS with pain 2/70 or less in the back and LLE.    Time  8    Period  Weeks    Status  New      PT LONG TERM GOAL #3   Title  Patient able to sit for 30 min comfortably.    Time  8    Period  Weeks    Status  New      PT LONG TERM GOAL #4   Title  Patient to demo 5/5 BLE strength to improve function.    Time  8    Period  Weeks    Status  New            Plan - 02/01/18 1056    Clinical Impression Statement  Patient presented today in clinic with pain mostly along L hip/ buttocks region. Prone centraliziation technique utilized with 2 pillows under core  as well as roadkill positioning. Patient reported pain ascending from inferior buttocks to superior buttocks with all centralization exercises. L hip and buttocks stretching completed as well in supine with tactile and verbal cues utilized to  assist with pain. Patient experienced stretching response with all stretches and provided an HEP for the stretches as well. Patient verbalized understanding of the HEP instructions. Normal modalities response noted following removal of the modalities as patient wishes to avoid traction at this time.    Rehab Potential  Good    PT Frequency  2x / week    PT Duration  8 weeks    PT Treatment/Interventions   ADLs/Self Care Home Management;Cryotherapy;Electrical Stimulation;Ultrasound;Moist Heat;Therapeutic activities;Therapeutic exercise;Patient/family education;Passive range of motion;Manual techniques;Dry needling;Traction;Neuromuscular re-education    PT Next Visit Plan  work on centralizing symptoms with modalities PRN per MPT POC.    PT Home Exercise Plan  HEP- UT, Levator Scapula stretches    Consulted and Agree with Plan of Care  Patient       Patient will benefit from skilled therapeutic intervention in order to improve the following deficits and impairments:  Decreased strength, Pain, Decreased activity tolerance, Decreased range of motion, Postural dysfunction  Visit Diagnosis: Acute left-sided low back pain with left-sided sciatica     Problem List Patient Active Problem List   Diagnosis Date Noted  . S/P left knee arthroscopy 12/01/17 12/08/2017  . Derangement of posterior horn of medial meniscus of left knee   . Derangement of anterior horn of lateral meniscus of left knee   . Multiple pulmonary nodules 07/07/2016  . Dyspnea 07/07/2016  . TIA (transient ischemic attack)   . Left sided numbness 11/02/2014  . Tobacco use 11/02/2014  . Bradycardia 11/02/2014  . Brain aneurysm 11/02/2014  . Crohn disease ([place]) 11/02/2014  . Fibromyalgia 11/02/2014  . Left-sided weakness 11/02/2014  . Chronic anxiety 11/02/2014  . CVA (cerebral infarction) 07/10/2013  . Dizziness 05/05/2012  . Anxiety state, unspecified 12/11/2009  . TOBACCO ABUSE 12/11/2009  . Subjective visual disturbance 12/11/2009  . Myalgia and myositis 12/11/2009  . PALPITATIONS 12/11/2009  . Precordial pain 12/11/2009    Standley Brooking, PTA 02/01/2018, 11:09 AM  [facility] [address] [place], [place], [address] Phone: [phone]   Fax:  [phone]  Name: ALYXANDRIA WENTZ MRN: [id number] Date of Birth: 07-11-1951

## 2018-02-03 ENCOUNTER — Ambulatory Visit: Payer: BLUE CROSS/BLUE SHIELD | Admitting: Physical Therapy

## 2018-02-03 ENCOUNTER — Encounter: Payer: Self-pay | Admitting: Physical Therapy

## 2018-02-03 DIAGNOSIS — M5442 Lumbago with sciatica, left side: Secondary | ICD-10-CM | POA: Diagnosis not present

## 2018-02-03 DIAGNOSIS — M542 Cervicalgia: Secondary | ICD-10-CM

## 2018-02-03 NOTE — Patient Instructions (Signed)
[facility]  DRY NEEDLING CONSENT FORM   Trigger point dry needling is a physical therapy approach to treat Myofascial Pain and Dysfunction.  Dry Needling (DN) is a valuable and effective way to deactivate myofascial trigger points (muscle knots/pain). It is skilled intervention that uses a thin filiform needle to penetrate the skin and stimulate underlying myofascial trigger points, muscular, and connective tissues for the management of neuromusculoskeletal pain and movement impairments.  A local twitch response (LTR) will be elicited.  This can sometimes feel like a deep ache in the muscle during the procedure. Multiple trigger points in multiple muscles can be treated during each treatment.  No medication of any kind is injected.   As with any medical treatment and procedure, there are possible adverse events.  While significant adverse events are uncommon, they do sometimes occur and must be considered prior to giving consent.  1. Dry needling often causes a "post needling soreness".  There can be an increase in pain from a couple of hours to 2-3 days, followed by an improvement in the overall pain state. 2. Any time a needle is used there is a risk of infection.  However, we are using new, sterile, and disposable needles; infections are extremely rare. 3. There is a possibility that you may bleed or bruise.  You may feel tired and some nausea following treatment. 4. There is a rare possibility of a pneumothorax (air in the chest cavity). 5. Allergic reaction to nickel in the stainless steel needle. 6. If a nerve is touched, it may cause paresthesia (a prickling/shock sensation) which is usually brief, but may continue for a couple of days.  Following treatment stay hydrated.  Continue regular activities but not too vigorous initially after treatment for 24-48 hours.  Dry Needling is best when combined with other physical therapy interventions such as  strengthening, stretching and other therapeutic modalities.   PLEASE ANSWER THE FOLLOWING QUESTIONS:  Do you have a lack of sensation?   Y/N  Do you have a phobia or fear of needles  Y/N  Are you pregnant?    Y/N If yes:  How many weeks? __________ Do you have any implanted devices?  Y/N If yes:  Pacemaker/Spinal Cord Stimulator/Deep Brain Stimulator/Insulin Pump/Other: ________________ Do you have any implants?  Y/N If yes: Breast/Facial/Pecs/Buttocks/Calves/Hip  Replacement/ Knee Replacement/Other: _________ Do you take any blood thinners?   Y/N If yes: Coumadin (Warfarin)/Other: ___________________ Do you have a bleeding disorder?   Y/N If yes: What kind: _________________________________ Do you take any immunosuppressants?  Y/N If yes:   What kind: _________________________________ Do you take anti-inflammatories?   Y/N If yes: What kind: Advil/Aspirin/Other: ________________ Have you ever been diagnosed with Scoliosis? Y/N Have you had back surgery?   Y/N If yes:  Laminectomy/Fusion/Other: ___________________   I have read, or had read to me, the above.  I have had the opportunity to ask any questions.  All of my questions have been answered to my satisfaction and I understand the risks involved with dry needling.  I consent to examination and treatment at [facility], including dry needling, of any and all of my involved and affected muscles.

## 2018-02-03 NOTE — Therapy (Signed)
[facility] [place], [place], [address] Phone: [phone]   Fax:  [phone]  Physical Therapy Treatment  Patient Details  Name: Stacy Hansen MRN: [id number] Date of Birth: 22-Jan-1951 Referring Provider: Arther Abbott, MD   Encounter Date: 02/03/2018  PT End of Session - 02/03/18 1026    Visit Number  3    Number of Visits  18    Date for PT Re-Evaluation  03/13/18    PT Start Time  0945    PT Stop Time  1036    PT Time Calculation (min)  51 min    Activity Tolerance  Patient tolerated treatment well    Behavior During Therapy  [facility] for tasks assessed/performed       Past Medical History:  Diagnosis Date  . Anxiety   . Atypical chest pain   . Brain aneurysm   . Chronic abdominal pain   . Crohn disease ([place])   . Crohn's colitis ([place])   . Depression   . Fibromyalgia   . GERD (gastroesophageal reflux disease)   . Headache   . 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 History of fibromyalgia   . Hyperplastic colon polyp   . Inflammatory bowel disease   . Panic attacks   . Stroke ([place])   . Tobacco abuse     Past Surgical History:  Procedure Laterality Date  . CATARACT EXTRACTION Bilateral   . IR GENERIC HISTORICAL  07/28/2016   IR RADIOLOGIST EVAL & MGMT 07/28/2016 [facility]-INTER[facility] RAD  . KNEE ARTHROSCOPY WITH MEDIAL MENISECTOMY Left 12/01/2017   Procedure: LEFT KNEE ARTHROSCOPY WITH PARTIAL MEDIAL AND LATERAL MENISECTOMY;  Surgeon: Carole Civil, MD;  Location: [facility];  Service: Orthopedics;  Laterality: Left;  . ORIF ULNAR FRACTURE Left   . ROTATOR CUFF REPAIR Left   . TONSILLECTOMY    . TUBAL LIGATION    . VAGINAL HYSTERECTOMY     has her ovaries  . VARICOSE VEIN SURGERY Right     There were no vitals filed for this visit.  Subjective Assessment - 02/03/18 1028    Subjective  No new complaints.    Pain Score  7     Pain Location  Hip    Pain Orientation  Left    Pain Descriptors /  Indicators  Discomfort    Pain Onset  More than a month ago                      [facility] Adult PT Treatment/Exercise - 02/03/18 0001      Exercises   Exercises  Knee/Hip      Lumbar Exercises: Aerobic   Nustep  Level 1 x 8 minutes.      Modalities   Modalities  Electrical Stimulation;Ultrasound      Acupuncturist Location  Left hip/Gluteal    Electrical Stimulation Action  IFC    Electrical Stimulation Parameters  80-150 Hz x 15 minutes.    Electrical Stimulation Goals  Pain      Ultrasound   Ultrasound Location  Left gluteal region.    Ultrasound Parameters  Combo e'stim/U/S at 1.50 w/CM2 x 8 minutes.      Manual Therapy   Manual Therapy  Soft tissue mobilization    Soft tissue mobilization  STW/M to include ischemic release technique to left glut med x 7 minutes  PT Short Term Goals - 06/02/17 0856      PT SHORT TERM GOAL #1   Title  Independent with a HEP.    Time  2    Period  Weeks    Status  Achieved      PT SHORT TERM GOAL #2   Title  Bilateral active cervical rotation to 55 degrees.    Time  2    Period  Weeks    Status  On-going      PT SHORT TERM GOAL #3   Title  Bilateral active cervical SBing= 15 degrees.    Time  4    Period  Weeks    Status  On-going        PT Long Term Goals - 01/30/18 1551      PT LONG TERM GOAL #1   Title  Pt independent with HEP    Time  8    Period  Weeks    Status  New    Target Date  03/27/18      PT LONG TERM GOAL #2   Title  Patient able to peform ADLS with pain 9/24 or less in the back and LLE.    Time  8    Period  Weeks    Status  New      PT LONG TERM GOAL #3   Title  Patient able to sit for 30 min comfortably.    Time  8    Period  Weeks    Status  New      PT LONG TERM GOAL #4   Title  Patient to demo 5/5 BLE strength to improve function.    Time  8    Period  Weeks    Status  New            Plan - 02/03/18 1103     Clinical Impression Statement  Patient did well with tretament today.  She was found to be very tender to palpation over her left TFL and glut medius.  We discussed dry needling and she was provided with a handout and consent form should she decide to have it done.    PT Treatment/Interventions  ADLs/Self Care Home Management;Cryotherapy;Electrical Stimulation;Ultrasound;Moist Heat;Therapeutic activities;Therapeutic exercise;Patient/family education;Passive range of motion;Manual techniques;Dry needling;Traction;Neuromuscular re-education    PT Next Visit Plan  work on centralizing symptoms with modalities PRN per MPT POC.    PT Home Exercise Plan  HEP- UT, Levator Scapula stretches    Consulted and Agree with Plan of Care  Patient       Patient will benefit from skilled therapeutic intervention in order to improve the following deficits and impairments:  Decreased strength, Pain, Decreased activity tolerance, Decreased range of motion, Postural dysfunction  Visit Diagnosis: Acute left-sided low back pain with left-sided sciatica  Cervicalgia     Problem List Patient Active Problem List   Diagnosis Date Noted  . S/P left knee arthroscopy 12/01/17 12/08/2017  . Derangement of posterior horn of medial meniscus of left knee   . Derangement of anterior horn of lateral meniscus of left knee   . Multiple pulmonary nodules 07/07/2016  . Dyspnea 07/07/2016  . TIA (transient ischemic attack)   . Left sided numbness 11/02/2014  . Tobacco use 11/02/2014  . Bradycardia 11/02/2014  . Brain aneurysm 11/02/2014  . Crohn disease ([place]) 11/02/2014  . Fibromyalgia 11/02/2014  . Left-sided weakness 11/02/2014  . Chronic anxiety 11/02/2014  . CVA (cerebral infarction) 07/10/2013  . Dizziness  05/05/2012  . Anxiety state, unspecified 12/11/2009  . TOBACCO ABUSE 12/11/2009  . Subjective visual disturbance 12/11/2009  . Myalgia and myositis 12/11/2009  . PALPITATIONS 12/11/2009  . Precordial pain  12/11/2009    [place], [place] MPT 02/03/2018, 11:07 AM  [facility] [address] [place], [place], [address] Phone: [phone]   Fax:  [phone]  Name: MARSHAYLA MITSCHKE MRN: [id number] Date of Birth: 07/27/1951

## 2018-02-09 ENCOUNTER — Encounter: Payer: BLUE CROSS/BLUE SHIELD | Admitting: Physical Therapy

## 2018-02-10 ENCOUNTER — Encounter: Payer: BLUE CROSS/BLUE SHIELD | Admitting: *Deleted

## 2018-02-13 ENCOUNTER — Ambulatory Visit: Payer: BLUE CROSS/BLUE SHIELD | Attending: Orthopedic Surgery | Admitting: Physical Therapy

## 2018-02-13 DIAGNOSIS — M542 Cervicalgia: Secondary | ICD-10-CM | POA: Diagnosis present

## 2018-02-13 DIAGNOSIS — M5442 Lumbago with sciatica, left side: Secondary | ICD-10-CM

## 2018-02-13 NOTE — Therapy (Signed)
[facility] [place], [place], [address] Phone: [phone]   Fax:  [phone]  Physical Therapy Treatment  Patient Details  Name: Stacy Hansen MRN: [id number] Date of Birth: 01/01/1951 Referring Provider: Arther Abbott, MD   Encounter Date: 02/13/2018  PT End of Session - 02/13/18 0825    Visit Number  4    Number of Visits  18    Date for PT Re-Evaluation  03/13/18    PT Start Time  0815    PT Stop Time  0904    PT Time Calculation (min)  49 min    Activity Tolerance  Patient tolerated treatment well    Behavior During Therapy  [facility] for tasks assessed/performed       Past Medical History:  Diagnosis Date  . Anxiety   . Atypical chest pain   . Brain aneurysm   . Chronic abdominal pain   . Crohn disease ([place])   . Crohn's colitis ([place])   . Depression   . Fibromyalgia   . GERD (gastroesophageal reflux disease)   . Headache   . 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 History of fibromyalgia   . Hyperplastic colon polyp   . Inflammatory bowel disease   . Panic attacks   . Stroke ([place])   . Tobacco abuse     Past Surgical History:  Procedure Laterality Date  . CATARACT EXTRACTION Bilateral   . IR GENERIC HISTORICAL  07/28/2016   IR RADIOLOGIST EVAL & MGMT 07/28/2016 [facility]INTER[facility]  . KNEE ARTHROSCOPY WITH MEDIAL MENISECTOMY Left 12/01/2017   Procedure: LEFT KNEE ARTHROSCOPY WITH PARTIAL MEDIAL AND LATERAL MENISECTOMY;  Surgeon: Carole Civil, MD;  Location: [facility];  Service: Orthopedics;  Laterality: Left;  . ORIF ULNAR FRACTURE Left   . ROTATOR CUFF REPAIR Left   . TONSILLECTOMY    . TUBAL LIGATION    . VAGINAL HYSTERECTOMY     has her ovaries  . VARICOSE VEIN SURGERY Right     There were no vitals filed for this visit.  Subjective Assessment - 02/13/18 0818    Subjective  Patient reported pain is 7/10 and has centralized to the L buttock. Patient reported no symptoms down the leg this  weekend.    Pertinent History  L knee arthroscopy (partial med/lat meniscectomy); stroke (3 TIA - 2015/2016/2017), brain aneurysm, L RCR, Rod L lower arm    Limitations  Sitting    How long can you sit comfortably?  10 min    How long can you walk comfortably?  4 hours (up on feet)    Patient Stated Goals  Get out of pain.    Currently in Pain?  Yes    Pain Score  7     Pain Orientation  Left    Pain Descriptors / Indicators  Discomfort    Pain Type  Acute pain    Pain Onset  More than a month ago         [facility] PT Assessment - 02/13/18 0001      Assessment   Medical Diagnosis  s/p L knee arthroscopy    Onset Date/Surgical Date  12/01/17    Next MD Visit  02/24/18      Precautions   Precautions  None            No data recorded       [facility] Adult PT Treatment/Exercise - 02/13/18 0001  Lumbar Exercises: Stretches   Figure 4 Stretch  3 reps;30 seconds;Supine;Without overpressure      Lumbar Exercises: Aerobic   Nustep  Level 1 x 70mnutes.      Lumbar Exercises: Prone   Other Prone Lumbar Exercises  Prone over 2 pillows x4 mins      Modalities   Modalities  Electrical Stimulation      Moist Heat Therapy   Number Minutes Moist Heat  15 Minutes    Moist Heat Location  Lumbar Spine      Electrical Stimulation   Electrical Stimulation Location  Left hip/Gluteal    Electrical Stimulation Action  IFC    Electrical Stimulation Parameters  80-150 x15    Electrical Stimulation Goals  Pain      Manual Therapy   Manual Therapy  Soft tissue mobilization    Soft tissue mobilization  STW/M to L glute to reduce pain                  PT Long Term Goals - 01/30/18 1551      PT LONG TERM GOAL #1   Title  Pt independent with HEP    Time  8    Period  Weeks    Status  New    Target Date  03/27/18      PT LONG TERM GOAL #2   Title  Patient able to peform ADLS with pain 20/97or less in the back and LLE.    Time  8    Period  Weeks    Status  New       PT LONG TERM GOAL #3   Title  Patient able to sit for 30 min comfortably.    Time  8    Period  Weeks    Status  New      PT LONG TERM GOAL #4   Title  Patient to demo 5/5 BLE strength to improve function.    Time  8    Period  Weeks    Status  New            Plan - 02/13/18 0859    Clinical Impression Statement  Patient was able to tolerate treatment well today with no reports of radiating symptoms. Patient reported figure 4 stretch was strong today as she has not been able to do it over the weekend. Patient instructed to try to perform when possible. Patient reported understanding. Normal response to modalties upon removal.    Clinical Presentation  Evolving    Clinical Decision Making  Moderate    Rehab Potential  Good    PT Frequency  2x / week    PT Duration  8 weeks    PT Treatment/Interventions  ADLs/Self Care Home Management;Cryotherapy;Electrical Stimulation;Ultrasound;Moist Heat;Therapeutic activities;Therapeutic exercise;Patient/family education;Passive range of motion;Manual techniques;Dry needling;Traction;Neuromuscular re-education    PT Next Visit Plan  work on centralizing symptoms with modalities PRN per MPT POC.    PT Home Exercise Plan  HEP- UT, Levator Scapula stretches    Consulted and Agree with Plan of Care  Patient       Patient will benefit from skilled therapeutic intervention in order to improve the following deficits and impairments:  Decreased strength, Pain, Decreased activity tolerance, Decreased range of motion, Postural dysfunction  Visit Diagnosis: Acute left-sided low back pain with left-sided sciatica     Problem List Patient Active Problem List   Diagnosis Date Noted  . S/P left knee arthroscopy 12/01/17 12/08/2017  .  Derangement of posterior horn of medial meniscus of left knee   . Derangement of anterior horn of lateral meniscus of left knee   . Multiple pulmonary nodules 07/07/2016  . Dyspnea 07/07/2016  . TIA (transient  ischemic attack)   . Left sided numbness 11/02/2014  . Tobacco use 11/02/2014  . Bradycardia 11/02/2014  . Brain aneurysm 11/02/2014  . Crohn disease ([place]) 11/02/2014  . Fibromyalgia 11/02/2014  . Left-sided weakness 11/02/2014  . Chronic anxiety 11/02/2014  . CVA (cerebral infarction) 07/10/2013  . Dizziness 05/05/2012  . Anxiety state, unspecified 12/11/2009  . TOBACCO ABUSE 12/11/2009  . Subjective visual disturbance 12/11/2009  . Myalgia and myositis 12/11/2009  . PALPITATIONS 12/11/2009  . Precordial pain 12/11/2009   Gabriela Eves, PT, DPT 02/13/2018, 9:10 AM  [facility] [address] [place], [place], [address] Phone: [phone]   Fax:  [phone]  Name: Stacy Hansen MRN: [id number] Date of Birth: Jun 16, 1951

## 2018-02-15 ENCOUNTER — Encounter: Payer: Self-pay | Admitting: Physical Therapy

## 2018-02-15 ENCOUNTER — Ambulatory Visit: Payer: BLUE CROSS/BLUE SHIELD | Admitting: Physical Therapy

## 2018-02-15 DIAGNOSIS — M5442 Lumbago with sciatica, left side: Secondary | ICD-10-CM

## 2018-02-15 NOTE — Therapy (Signed)
[facility] [place], [place], [address] Phone: [phone]   Fax:  [phone]  Physical Therapy Treatment  Patient Details  Name: Stacy Hansen MRN: [id number] Date of Birth: June 25, 1951 Referring Provider: Arther Abbott, MD   Encounter Date: 02/15/2018  PT End of Session - 02/15/18 0851    Visit Number  5    Number of Visits  18    Date for PT Re-Evaluation  03/13/18    PT Start Time  0814    PT Stop Time  0902    PT Time Calculation (min)  48 min    Activity Tolerance  Patient tolerated treatment well    Behavior During Therapy  [facility] for tasks assessed/performed       Past Medical History:  Diagnosis Date  . Anxiety   . Atypical chest pain   . Brain aneurysm   . Chronic abdominal pain   . Crohn disease ([place])   . Crohn's colitis ([place])   . Depression   . Fibromyalgia   . GERD (gastroesophageal reflux disease)   . Headache   . 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 History of fibromyalgia   . Hyperplastic colon polyp   . Inflammatory bowel disease   . Panic attacks   . Stroke ([place])   . Tobacco abuse     Past Surgical History:  Procedure Laterality Date  . CATARACT EXTRACTION Bilateral   . IR GENERIC HISTORICAL  07/28/2016   IR RADIOLOGIST EVAL & MGMT 07/28/2016 [facility]-INTERV RAD  . KNEE ARTHROSCOPY WITH MEDIAL MENISECTOMY Left 12/01/2017   Procedure: LEFT KNEE ARTHROSCOPY WITH PARTIAL MEDIAL AND LATERAL MENISECTOMY;  Surgeon: Carole Civil, MD;  Location: [facility];  Service: Orthopedics;  Laterality: Left;  . ORIF ULNAR FRACTURE Left   . ROTATOR CUFF REPAIR Left   . TONSILLECTOMY    . TUBAL LIGATION    . VAGINAL HYSTERECTOMY     has her ovaries  . VARICOSE VEIN SURGERY Right     There were no vitals filed for this visit.  Subjective Assessment - 02/15/18 0819    Subjective  Patient reported ongoing discomfort in knee and buttock left side, did good after last treatment    Pertinent  History  L knee arthroscopy (partial med/lat meniscectomy); stroke (3 TIA - 2015/2016/2017), brain aneurysm, L RCR, Rod L lower arm    Limitations  Sitting    How long can you sit comfortably?  10 min    How long can you walk comfortably?  4 hours (up on feet)    Patient Stated Goals  Get out of pain.    Currently in Pain?  Yes    Pain Score  7     Pain Location  Other (Comment) knee and buttock/hip    Pain Orientation  Left    Pain Descriptors / Indicators  Sore    Pain Type  Acute pain    Pain Onset  More than a month ago    Pain Frequency  Constant    Aggravating Factors   prolong movement    Pain Relieving Factors  maual STW                       [facility] Adult PT Treatment/Exercise - 02/15/18 0001      Lumbar Exercises: Aerobic   Nustep  L2 x10 UE/LE activity      Knee/Hip Exercises: Stretches   Piriformis Stretch  Left;3 reps;30 seconds      Knee/Hip Exercises: Sidelying   Hip ABduction  Strengthening;Left;2 sets;10 reps    Clams  x15      Moist Heat Therapy   Number Minutes Moist Heat  15 Minutes    Moist Heat Location  Lumbar Spine      Electrical Stimulation   Electrical Stimulation Location  Left hip/Gluteal    Electrical Stimulation Action  IFC    Electrical Stimulation Parameters  80-150hz  x15    Electrical Stimulation Goals  Pain      Manual Therapy   Manual Therapy  Soft tissue mobilization    Soft tissue mobilization  STW/M /TPR to L glute, piriformis and hip region to reduce pain                  PT Long Term Goals - 02/15/18 6767      PT LONG TERM GOAL #1   Title  Pt independent with HEP    Time  8    Period  Weeks    Status  On-going      PT LONG TERM GOAL #2   Title  Patient able to peform ADLS with pain 2/09 or less in the back and LLE.    Time  8    Period  Weeks    Status  On-going      PT LONG TERM GOAL #3   Title  Patient able to sit for 30 min comfortably.    Time  8    Period  Weeks    Status  On-going       PT LONG TERM GOAL #4   Title  Patient to demo 5/5 BLE strength to improve function.    Time  8    Period  Weeks    Status  On-going            Plan - 02/15/18 4709    Clinical Impression Statement  Patient tolerated treatment well today. Patient able to progress slowly with strength/stabilization exercises due to pain in left buttock/hip area. Today patient had palpable trigger points and soreness in left hip/buttock area. Patient felt better after treatment today and will progress per tolerance. Goals ongoing at this time.     Rehab Potential  Good    PT Frequency  2x / week    PT Duration  8 weeks    PT Treatment/Interventions  ADLs/Self Care Home Management;Cryotherapy;Electrical Stimulation;Ultrasound;Moist Heat;Therapeutic activities;Therapeutic exercise;Patient/family education;Passive range of motion;Manual techniques;Dry needling;Traction;Neuromuscular re-education    PT Next Visit Plan  cont with POC to reduce pain and stabilize.strengthen left LE    Consulted and Agree with Plan of Care  Patient       Patient will benefit from skilled therapeutic intervention in order to improve the following deficits and impairments:  Decreased strength, Pain, Decreased activity tolerance, Decreased range of motion, Postural dysfunction  Visit Diagnosis: Acute left-sided low back pain with left-sided sciatica     Problem List Patient Active Problem List   Diagnosis Date Noted  . S/P left knee arthroscopy 12/01/17 12/08/2017  . Derangement of posterior horn of medial meniscus of left knee   . Derangement of anterior horn of lateral meniscus of left knee   . Multiple pulmonary nodules 07/07/2016  . Dyspnea 07/07/2016  . TIA (transient ischemic attack)   . Left sided numbness 11/02/2014  . Tobacco use 11/02/2014  . Bradycardia 11/02/2014  . Brain aneurysm 11/02/2014  . Crohn disease ([place]) 11/02/2014  .  Fibromyalgia 11/02/2014  . Left-sided weakness 11/02/2014  . Chronic  anxiety 11/02/2014  . CVA (cerebral infarction) 07/10/2013  . Dizziness 05/05/2012  . Anxiety state, unspecified 12/11/2009  . TOBACCO ABUSE 12/11/2009  . Subjective visual disturbance 12/11/2009  . Myalgia and myositis 12/11/2009  . PALPITATIONS 12/11/2009  . Precordial pain 12/11/2009    Phillips Climes, PTA 02/15/2018, 9:06 AM  [facility] [place], [place], [address] Phone: [phone]   Fax:  [phone]  Name: Stacy Hansen MRN: [id number] Date of Birth: November 09, 1951

## 2018-02-17 ENCOUNTER — Ambulatory Visit: Payer: BLUE CROSS/BLUE SHIELD | Admitting: Physical Therapy

## 2018-02-17 DIAGNOSIS — M5442 Lumbago with sciatica, left side: Secondary | ICD-10-CM | POA: Diagnosis not present

## 2018-02-17 NOTE — Therapy (Signed)
[facility] [place], [place], [address] Phone: [phone]   Fax:  [phone]  Physical Therapy Treatment  Patient Details  Name: Stacy Hansen MRN: [id number] Date of Birth: 11-Apr-1951 Referring Provider: Arther Abbott, MD   Encounter Date: 02/17/2018  PT End of Session - 02/17/18 0859    Visit Number  6    Number of Visits  18    Date for PT Re-Evaluation  03/13/18    PT Start Time  0900    PT Stop Time  0953    PT Time Calculation (min)  53 min    Activity Tolerance  Patient tolerated treatment well    Behavior During Therapy  [facility] for tasks assessed/performed       Past Medical History:  Diagnosis Date  . Anxiety   . Atypical chest pain   . Brain aneurysm   . Chronic abdominal pain   . Crohn disease ([place])   . Crohn's colitis ([place])   . Depression   . Fibromyalgia   . GERD (gastroesophageal reflux disease)   . Headache   . 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 History of fibromyalgia   . Hyperplastic colon polyp   . Inflammatory bowel disease   . Panic attacks   . Stroke ([place])   . Tobacco abuse     Past Surgical History:  Procedure Laterality Date  . CATARACT EXTRACTION Bilateral   . IR GENERIC HISTORICAL  07/28/2016   IR RADIOLOGIST EVAL & MGMT 07/28/2016 [facility]INTER[facility] RAD  . KNEE ARTHROSCOPY WITH MEDIAL MENISECTOMY Left 12/01/2017   Procedure: LEFT KNEE ARTHROSCOPY WITH PARTIAL MEDIAL AND LATERAL MENISECTOMY;  Surgeon: Carole Civil, MD;  Location: [facility];  Service: Orthopedics;  Laterality: Left;  . ORIF ULNAR FRACTURE Left   . ROTATOR CUFF REPAIR Left   . TONSILLECTOMY    . TUBAL LIGATION    . VAGINAL HYSTERECTOMY     has her ovaries  . VARICOSE VEIN SURGERY Right     There were no vitals filed for this visit.  Subjective Assessment - 02/17/18 0859    Subjective  Patient states her knee is bothering her today, but she feels it is the weather. However, her left hip has been  really hurting since last visit and the manual therapy.     Pertinent History  L knee arthroscopy (partial med/lat meniscectomy); stroke (3 TIA - 2015/2016/2017), brain aneurysm, L RCR, Rod L lower arm    Patient Stated Goals  Get out of pain.    Currently in Pain?  Yes    Pain Score  6     Pain Location  Knee    Pain Orientation  Left    Pain Descriptors / Indicators  Sore    Pain Type  Acute pain    Pain Onset  More than a month ago    Multiple Pain Sites  Yes    Pain Score  8    Pain Location  Hip    Pain Orientation  Left    Pain Descriptors / Indicators  Sore    Pain Type  Acute pain    Pain Onset  In the past 7 days                       [facility] Adult PT Treatment/Exercise - 02/17/18 0001      Self-Care   Self-Care  Other Self-Care Comments    Other Self-Care  Comments   education on spine and nerves and how traction would separate the joint space to relieve pressure on the nerve; also educated patient on traction procedure      Lumbar Exercises: Aerobic   Recumbent Bike  L2 x 5 min      Lumbar Exercises: Standing   Other Standing Lumbar Exercises  standing extension x 10      Lumbar Exercises: Supine   Other Supine Lumbar Exercises  Bil knee flex x 10 to assess sx response; worse      Lumbar Exercises: Prone   Other Prone Lumbar Exercises  prone lying decreases hip pain; POE increases LBP to 5/10    Other Prone Lumbar Exercises  prone over pillow decreases LBP and hip pain 6/10 lowest      Modalities   Modalities  Electrical Stimulation;Moist Heat;Traction      Moist Heat Therapy   Number Minutes Moist Heat  15 Minutes    Moist Heat Location  Lumbar Spine;Hip      Electrical Stimulation   Electrical Stimulation Location  Bil lumbar and Left gluteals    Electrical Stimulation Action  IFC    Electrical Stimulation Parameters  80-150 Hz x 15 min    Electrical Stimulation Goals  Pain      Traction   Type of Traction  Lumbar    Min (lbs)  10    Max  (lbs)  40    Hold Time  99    Rest Time  5    Time  5                  PT Long Term Goals - 02/15/18 6384      PT LONG TERM GOAL #1   Title  Pt independent with HEP    Time  8    Period  Weeks    Status  On-going      PT LONG TERM GOAL #2   Title  Patient able to peform ADLS with pain 5/36 or less in the back and LLE.    Time  8    Period  Weeks    Status  On-going      PT LONG TERM GOAL #3   Title  Patient able to sit for 30 min comfortably.    Time  8    Period  Weeks    Status  On-going      PT LONG TERM GOAL #4   Title  Patient to demo 5/5 BLE strength to improve function.    Time  8    Period  Weeks    Status  On-going            Plan - 02/17/18 0935    Clinical Impression Statement  Patient presented today with increased pain in L piriformis region since last treatment. Various positioning tried to decrease pain and prone over one pillow was still the best for decreasing pain. Patient reported significant decrease in pain after just a few min of estim. Traction was initiated at 46# then reduced to 40# x 5 min. Pt asked to come off of traction due to feelings of claustrophobia.     PT Treatment/Interventions  ADLs/Self Care Home Management;Cryotherapy;Electrical Stimulation;Ultrasound;Moist Heat;Therapeutic activities;Therapeutic exercise;Patient/family education;Passive range of motion;Manual techniques;Dry needling;Traction;Neuromuscular re-education    PT Next Visit Plan  Hold aggressive manual therapy to piriformis; Try long leg traction and positional traction for low back; possibly DN once inflammation goes down.  Patient will benefit from skilled therapeutic intervention in order to improve the following deficits and impairments:  Decreased strength, Pain, Decreased activity tolerance, Decreased range of motion, Postural dysfunction  Visit Diagnosis: Acute left-sided low back pain with left-sided sciatica     Problem List Patient  Active Problem List   Diagnosis Date Noted  . S/P left knee arthroscopy 12/01/17 12/08/2017  . Derangement of posterior horn of medial meniscus of left knee   . Derangement of anterior horn of lateral meniscus of left knee   . Multiple pulmonary nodules 07/07/2016  . Dyspnea 07/07/2016  . TIA (transient ischemic attack)   . Left sided numbness 11/02/2014  . Tobacco use 11/02/2014  . Bradycardia 11/02/2014  . Brain aneurysm 11/02/2014  . Crohn disease ([place]) 11/02/2014  . Fibromyalgia 11/02/2014  . Left-sided weakness 11/02/2014  . Chronic anxiety 11/02/2014  . CVA (cerebral infarction) 07/10/2013  . Dizziness 05/05/2012  . Anxiety state, unspecified 12/11/2009  . TOBACCO ABUSE 12/11/2009  . Subjective visual disturbance 12/11/2009  . Myalgia and myositis 12/11/2009  . PALPITATIONS 12/11/2009  . Precordial pain 12/11/2009    Madelyn Flavors PT 02/17/2018, 12:17 PM  [facility] [address] [place], [place], [address] Phone: [phone]   Fax:  [phone]  Name: LAYANA KONKEL MRN: [id number] Date of Birth: 1951/09/23

## 2018-02-20 ENCOUNTER — Ambulatory Visit: Payer: BLUE CROSS/BLUE SHIELD | Admitting: Physical Therapy

## 2018-02-20 DIAGNOSIS — M5442 Lumbago with sciatica, left side: Secondary | ICD-10-CM

## 2018-02-20 DIAGNOSIS — M542 Cervicalgia: Secondary | ICD-10-CM

## 2018-02-20 NOTE — Therapy (Signed)
[facility] [place], [place], [address] Phone: [phone]   Fax:  [phone]  Physical Therapy Treatment  Patient Details  Name: Stacy Hansen MRN: [id number] Date of Birth: 1951-09-24 Referring Provider: Arther Abbott, MD   Encounter Date: 02/20/2018  PT End of Session - 02/20/18 1450    Visit Number  7    Number of Visits  18    Date for PT Re-Evaluation  03/13/18    PT Start Time  0145    PT Stop Time  0230    PT Time Calculation (min)  45 min       Past Medical History:  Diagnosis Date  . Anxiety   . Atypical chest pain   . Brain aneurysm   . Chronic abdominal pain   . Crohn disease ([place])   . Crohn's colitis ([place])   . Depression   . Fibromyalgia   . GERD (gastroesophageal reflux disease)   . Headache   . 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 History of fibromyalgia   . Hyperplastic colon polyp   . Inflammatory bowel disease   . Panic attacks   . Stroke ([place])   . Tobacco abuse     Past Surgical History:  Procedure Laterality Date  . CATARACT EXTRACTION Bilateral   . IR GENERIC HISTORICAL  07/28/2016   IR RADIOLOGIST EVAL & MGMT 07/28/2016 [facility]INTER[facility] RAD  . KNEE ARTHROSCOPY WITH MEDIAL MENISECTOMY Left 12/01/2017   Procedure: LEFT KNEE ARTHROSCOPY WITH PARTIAL MEDIAL AND LATERAL MENISECTOMY;  Surgeon: Carole Civil, MD;  Location: [facility];  Service: Orthopedics;  Laterality: Left;  . ORIF ULNAR FRACTURE Left   . ROTATOR CUFF REPAIR Left   . TONSILLECTOMY    . TUBAL LIGATION    . VAGINAL HYSTERECTOMY     has her ovaries  . VARICOSE VEIN SURGERY Right     There were no vitals filed for this visit.  Subjective Assessment - 02/20/18 1432    Subjective  My pain is about an 8 today in my back.    Pertinent History  L knee arthroscopy (partial med/lat meniscectomy); stroke (3 TIA - 2015/2016/2017), brain aneurysm, L RCR, Rod L lower arm    Pain Score  8     Pain Location  Back     Pain Orientation  Left                       [facility] Adult PT Treatment/Exercise - 02/20/18 0001      Exercises   Exercises  Knee/Hip      Lumbar Exercises: Aerobic   Recumbent Bike  Level 3 x 10 minutes.      Modalities   Modalities  Electrical Stimulation      Moist Heat Therapy   Number Minutes Moist Heat  15 Minutes    Moist Heat Location  Lumbar Spine      Electrical Stimulation   Electrical Stimulation Location  Left SIJ and Piriformis region.    Electrical Stimulation Action  Pre-mod.    Electrical Stimulation Parameters  80-150 Hz x 15 minutes.    Electrical Stimulation Goals  Pain      Manual Therapy   Manual Therapy  Soft tissue mobilization    Soft tissue mobilization  Right sdly position with folded pillow between knees for comfort:  STW/M x 13 minutes to patients left QL; sacral and iliolumbar ligaments and pirifromis.  PT Long Term Goals - 02/15/18 8937      PT LONG TERM GOAL #1   Title  Pt independent with HEP    Time  8    Period  Weeks    Status  On-going      PT LONG TERM GOAL #2   Title  Patient able to peform ADLS with pain 3/42 or less in the back and LLE.    Time  8    Period  Weeks    Status  On-going      PT LONG TERM GOAL #3   Title  Patient able to sit for 30 min comfortably.    Time  8    Period  Weeks    Status  On-going      PT LONG TERM GOAL #4   Title  Patient to demo 5/5 BLE strength to improve function.    Time  8    Period  Weeks    Status  On-going            Plan - 02/20/18 1438    Clinical Impression Statement  Patient did well with treatment today.  She was found to have a leg length discrepancy (left shorter) due to a left pelvic rotation.  Patient instructed in adductor squeeze and resisted left hip flexion which equalized her leg lengths.  Very good response to treatment today.    PT Treatment/Interventions  ADLs/Self Care Home Management;Cryotherapy;Electrical  Stimulation;Ultrasound;Moist Heat;Therapeutic activities;Therapeutic exercise;Patient/family education;Passive range of motion;Manual techniques;Dry needling;Traction;Neuromuscular re-education    PT Next Visit Plan  Hold aggressive manual therapy to piriformis; Try long leg traction and positional traction for low back; possibly DN once inflammation goes down.    PT Home Exercise Plan  HEP- UT, Levator Scapula stretches    Consulted and Agree with Plan of Care  Patient       Patient will benefit from skilled therapeutic intervention in order to improve the following deficits and impairments:  Decreased strength, Pain, Decreased activity tolerance, Decreased range of motion, Postural dysfunction  Visit Diagnosis: Acute left-sided low back pain with left-sided sciatica  Cervicalgia     Problem List Patient Active Problem List   Diagnosis Date Noted  . S/P left knee arthroscopy 12/01/17 12/08/2017  . Derangement of posterior horn of medial meniscus of left knee   . Derangement of anterior horn of lateral meniscus of left knee   . Multiple pulmonary nodules 07/07/2016  . Dyspnea 07/07/2016  . TIA (transient ischemic attack)   . Left sided numbness 11/02/2014  . Tobacco use 11/02/2014  . Bradycardia 11/02/2014  . Brain aneurysm 11/02/2014  . Crohn disease ([place]) 11/02/2014  . Fibromyalgia 11/02/2014  . Left-sided weakness 11/02/2014  . Chronic anxiety 11/02/2014  . CVA (cerebral infarction) 07/10/2013  . Dizziness 05/05/2012  . Anxiety state, unspecified 12/11/2009  . TOBACCO ABUSE 12/11/2009  . Subjective visual disturbance 12/11/2009  . Myalgia and myositis 12/11/2009  . PALPITATIONS 12/11/2009  . Precordial pain 12/11/2009    Stacy Hansen, Stacy Hansen 02/20/2018, 2:51 PM  [facility] [address] [place], [place], [address] Phone: [phone]   Fax:  [phone]  Name: Stacy Hansen MRN: [id number] Date of Birth:  03-12-1951

## 2018-02-22 ENCOUNTER — Ambulatory Visit: Payer: BLUE CROSS/BLUE SHIELD | Admitting: Physical Therapy

## 2018-02-22 ENCOUNTER — Encounter: Payer: Self-pay | Admitting: Physical Therapy

## 2018-02-22 DIAGNOSIS — M5442 Lumbago with sciatica, left side: Secondary | ICD-10-CM

## 2018-02-22 NOTE — Therapy (Signed)
[facility] [place], [place], [address] Phone: [phone]   Fax:  [phone]  Physical Therapy Treatment  Patient Details  Name: Stacy Hansen MRN: [id number] Date of Birth: 03-Jun-1951 Referring Provider: Arther Abbott, MD   Encounter Date: 02/22/2018  PT End of Session - 02/22/18 0924    Visit Number  8    Number of Visits  18    Date for PT Re-Evaluation  03/13/18    PT Start Time  0901    PT Stop Time  0956    PT Time Calculation (min)  55 min    Activity Tolerance  Patient tolerated treatment well    Behavior During Therapy  [facility] for tasks assessed/performed       Past Medical History:  Diagnosis Date  . Anxiety   . Atypical chest pain   . Brain aneurysm   . Chronic abdominal pain   . Crohn disease ([place])   . Crohn's colitis ([place])   . Depression   . Fibromyalgia   . GERD (gastroesophageal reflux disease)   . Headache   . 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 History of fibromyalgia   . Hyperplastic colon polyp   . Inflammatory bowel disease   . Panic attacks   . Stroke ([place])   . Tobacco abuse     Past Surgical History:  Procedure Laterality Date  . CATARACT EXTRACTION Bilateral   . IR GENERIC HISTORICAL  07/28/2016   IR RADIOLOGIST EVAL & MGMT 07/28/2016 [facility]-INTER[facility] RAD  . KNEE ARTHROSCOPY WITH MEDIAL MENISECTOMY Left 12/01/2017   Procedure: LEFT KNEE ARTHROSCOPY WITH PARTIAL MEDIAL AND LATERAL MENISECTOMY;  Surgeon: Carole Civil, MD;  Location: [facility];  Service: Orthopedics;  Laterality: Left;  . ORIF ULNAR FRACTURE Left   . ROTATOR CUFF REPAIR Left   . TONSILLECTOMY    . TUBAL LIGATION    . VAGINAL HYSTERECTOMY     has her ovaries  . VARICOSE VEIN SURGERY Right     There were no vitals filed for this visit.  Subjective Assessment - 02/22/18 0901    Subjective  Patient reported ongoing pain in back    Pertinent History  L knee arthroscopy (partial med/lat meniscectomy);  stroke (3 TIA - 2015/2016/2017), brain aneurysm, L RCR, Rod L lower arm    Limitations  Sitting    How long can you sit comfortably?  10 min    How long can you walk comfortably?  4 hours (up on feet)    Patient Stated Goals  Get out of pain.    Pain Score  7     Pain Location  Back    Pain Orientation  Left    Pain Descriptors / Indicators  Sore    Pain Onset  More than a month ago    Pain Frequency  Constant    Aggravating Factors   prolong activity    Pain Relieving Factors  rest                       [facility] Adult PT Treatment/Exercise - 02/22/18 0001      Lumbar Exercises: Aerobic   Recumbent Bike  L5 x72mn UE/LE      Knee/Hip Exercises: Stretches   Hip Flexor Stretch  Left;3 reps;20 seconds      Knee/Hip Exercises: Standing   Heel Raises  Both;10 reps;1 set    Hip Abduction  Stengthening;Both;2 sets;10 reps  Hip Extension  Stengthening;Both;2 sets;10 reps    Rocker Board  2 minutes      Moist Heat Therapy   Number Minutes Moist Heat  15 Minutes    Moist Heat Location  Lumbar Spine      Electrical Stimulation   Electrical Stimulation Location  Left SIJ and Piriformis region.    Electrical Stimulation Action  premod    Electrical Stimulation Parameters  80-150hz  x4mn    Electrical Stimulation Goals  Pain      Ultrasound   Ultrasound Location  left QL    Ultrasound Parameters  1.5w/cm2/100%/133m x1061m   Ultrasound Goals  Pain                  PT Long Term Goals - 02/15/18 [id number]   PT LONG TERM GOAL #1   Title  Pt independent with HEP    Time  8    Period  Weeks    Status  On-going      PT LONG TERM GOAL #2   Title  Patient able to peform ADLS with pain 12/28/35 less in the back and LLE.    Time  8    Period  Weeks    Status  On-going      PT LONG TERM GOAL #3   Title  Patient able to sit for 30 min comfortably.    Time  8    Period  Weeks    Status  On-going      PT LONG TERM GOAL #4   Title  Patient to demo 5/5 BLE  strength to improve function.    Time  8    Period  Weeks    Status  On-going            Plan - 02/22/18 0925    Clinical Impression Statement  Patient tolerated treatment well today. Patient able to progress with LE strengthening exercises and progression. Patient has weakness esp in left LE and discomfort in left QL and glut today. Patient has very palpable pain in left QL/glut today. Goals progressing.     Rehab Potential  Good    PT Frequency  2x / week    PT Duration  8 weeks    PT Treatment/Interventions  ADLs/Self Care Home Management;Cryotherapy;Electrical Stimulation;Ultrasound;Moist Heat;Therapeutic activities;Therapeutic exercise;Patient/family education;Passive range of motion;Manual techniques;Dry needling;Traction;Neuromuscular re-education    PT Next Visit Plan  Hold aggressive manual therapy to piriformis; Try long leg traction and positional traction for low back; possibly DN once inflammation goes down.    Consulted and Agree with Plan of Care  Patient       Patient will benefit from skilled therapeutic intervention in order to improve the following deficits and impairments:  Decreased strength, Pain, Decreased activity tolerance, Decreased range of motion, Postural dysfunction  Visit Diagnosis: Acute left-sided low back pain with left-sided sciatica     Problem List Patient Active Problem List   Diagnosis Date Noted  . S/P left knee arthroscopy 12/01/17 12/08/2017  . Derangement of posterior horn of medial meniscus of left knee   . Derangement of anterior horn of lateral meniscus of left knee   . Multiple pulmonary nodules 07/07/2016  . Dyspnea 07/07/2016  . TIA (transient ischemic attack)   . Left sided numbness 11/02/2014  . Tobacco use 11/02/2014  . Bradycardia 11/02/2014  . Brain aneurysm 11/02/2014  . [place] disease [place]  . Fibromyalgia 11/02/2014  . Left-sided weakness 11/02/2014  .  Chronic anxiety 11/02/2014  . CVA (cerebral  infarction) 07/10/2013  . Dizziness 05/05/2012  . Anxiety state, unspecified 12/11/2009  . TOBACCO ABUSE 12/11/2009  . Subjective visual disturbance 12/11/2009  . Myalgia and myositis 12/11/2009  . PALPITATIONS 12/11/2009  . Precordial pain 12/11/2009    Phillips Climes, PTA 02/22/2018, 10:08 AM  [facility] [place], [place], [address] Phone: [phone]   Fax:  [phone]  Name: Stacy Hansen MRN: [id number] Date of Birth: 10-03-1951

## 2018-02-24 ENCOUNTER — Encounter: Payer: Self-pay | Admitting: Orthopedic Surgery

## 2018-02-24 ENCOUNTER — Ambulatory Visit (INDEPENDENT_AMBULATORY_CARE_PROVIDER_SITE_OTHER): Payer: Self-pay | Admitting: Orthopedic Surgery

## 2018-02-24 ENCOUNTER — Other Ambulatory Visit (HOSPITAL_COMMUNITY): Payer: Self-pay | Admitting: Adult Health Nurse Practitioner

## 2018-02-24 VITALS — BP 106/67 | HR 78 | Ht 63.0 in | Wt 119.0 lb

## 2018-02-24 DIAGNOSIS — M7052 Other bursitis of knee, left knee: Secondary | ICD-10-CM

## 2018-02-24 DIAGNOSIS — M2142 Flat foot [pes planus] (acquired), left foot: Secondary | ICD-10-CM

## 2018-02-24 DIAGNOSIS — M5136 Other intervertebral disc degeneration, lumbar region: Secondary | ICD-10-CM

## 2018-02-24 DIAGNOSIS — M5432 Sciatica, left side: Secondary | ICD-10-CM

## 2018-02-24 DIAGNOSIS — Z9889 Other specified postprocedural states: Secondary | ICD-10-CM

## 2018-02-24 DIAGNOSIS — Z1231 Encounter for screening mammogram for malignant neoplasm of breast: Secondary | ICD-10-CM

## 2018-02-24 NOTE — Progress Notes (Signed)
Progress Note   Patient ID: Stacy Hansen, female   DOB: 11-19-1950, 67 y.o.   MRN: [id number]  Chief Complaint  Patient presents with  . Post-op Follow-up    left knee arthroscopy 12/01/17  . Back Pain    left leg / back pain better with therapy moving/ walking better     HPI The patient had knee arthroscopy as noted above she was doing well but developed bursitis and then sciatica and back pain she is gone to therapy she is improving we want her to continue her therapy up to 26 April return to work 29 April unrestricted.  ROS Current Meds  Medication Sig  . acetaminophen (TYLENOL) 500 MG tablet Take 500 mg by mouth every 6 (six) hours as needed for moderate pain or headache.   . albuterol (PROVENTIL HFA;VENTOLIN HFA) 108 (90 BASE) MCG/ACT inhaler Inhale 2 puffs into the lungs every 6 (six) hours as needed for wheezing or shortness of breath.  Marland Kitchen aspirin 325 MG tablet Take 1 tablet (325 mg total) by mouth daily.  . carboxymethylcellulose (REFRESH PLUS) 0.5 % SOLN Place 1 drop into both eyes 3 (three) times daily as needed (for dry/itching eyes).   . Cholecalciferol (VITAMIN D3) 1000 units CAPS Take 1,000 Units by mouth.   . cyclobenzaprine (FLEXERIL) 5 MG tablet Take 1 tablet (5 mg total) by mouth 3 (three) times daily as needed for muscle spasms.  . cycloSPORINE (RESTASIS) 0.05 % ophthalmic emulsion Place 1 drop into both eyes 3 (three) times daily.  Marland Kitchen gabapentin (NEURONTIN) 100 MG capsule Take 1 capsule (100 mg total) by mouth 3 (three) times daily.  Marland Kitchen guaiFENesin-dextromethorphan (ROBITUSSIN DM) 100-10 MG/5ML syrup Take 5 mLs by mouth every 4 (four) hours as needed for cough.  . hydrOXYzine (ATARAX/VISTARIL) 50 MG tablet   . ibuprofen (ADVIL,MOTRIN) 800 MG tablet Take 1 tablet (800 mg total) by mouth every 8 (eight) hours as needed.  Marland Kitchen LORazepam (ATIVAN) 0.5 MG tablet Take 0.5 mg by mouth at bedtime.   . vitamin B-12 (CYANOCOBALAMIN) 1000 MCG tablet Take 1,000 mcg by mouth daily.     Allergies  Allergen Reactions  . Dexamethasone Other (See Comments)    halluncinations  . Ciprofloxacin Other (See Comments)    Unknown reaction per pt  . Meperidine Hcl Nausea And Vomiting and Other (See Comments)    Causes blood pressure to drop when taken with Phenergan through IV  . Opium Other (See Comments)    Causes blood pressure to drop when taken with Phenergan through IV  . Promethazine Hcl Nausea And Vomiting and Other (See Comments)    Causes blood pressure to drop when taken with Demeral via IV  . Propoxyphene N-Acetaminophen Other (See Comments)    "pass out"     BP 106/67   Pulse 78   Ht 5' 3"  (1.6 m)   Wt 119 lb (54 kg)   BMI 21.08 kg/m   Physical Exam  Mild tenderness left SI joint no radicular pain with flexion of the spine knee looks good no tenderness or swelling full range of motion   Medical decision-making Encounter Diagnoses  Name Primary?  . S/P left knee arthroscopy 12/01/17 Yes  . DDD (degenerative disc disease), lumbar   . Sciatica of left side   . Flat foot (pes planus) (acquired), left foot   . Pes anserinus bursitis of left knee     She will continue her therapy out through 26 April return to work April 29 follow-up  with [place] as needed     Arther Abbott, MD 02/24/2018 10:16 AM

## 2018-02-24 NOTE — Patient Instructions (Signed)
RTW April 29

## 2018-02-27 ENCOUNTER — Ambulatory Visit: Payer: BLUE CROSS/BLUE SHIELD | Admitting: Physical Therapy

## 2018-02-27 ENCOUNTER — Encounter: Payer: Self-pay | Admitting: Physical Therapy

## 2018-02-27 DIAGNOSIS — M5442 Lumbago with sciatica, left side: Secondary | ICD-10-CM

## 2018-02-27 NOTE — Therapy (Signed)
[facility] [place], [place], [address] Phone: [phone]   Fax:  [phone]  Physical Therapy Treatment  Patient Details  Name: Stacy Hansen MRN: [id number] Date of Birth: 04-16-1951 Referring Provider: Arther Abbott, MD   Encounter Date: 02/27/2018  PT End of Session - 02/27/18 0906    Visit Number  9    Number of Visits  18    Date for PT Re-Evaluation  03/13/18    PT Start Time  0901    PT Stop Time  0956    PT Time Calculation (min)  55 min    Activity Tolerance  Patient tolerated treatment well    Behavior During Therapy  [facility] for tasks assessed/performed       Past Medical History:  Diagnosis Date  . Anxiety   . Atypical chest pain   . Brain aneurysm   . Chronic abdominal pain   . Crohn disease ([place])   . Crohn's colitis ([place])   . Depression   . Fibromyalgia   . GERD (gastroesophageal reflux disease)   . Headache   . 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 History of fibromyalgia   . Hyperplastic colon polyp   . Inflammatory bowel disease   . Panic attacks   . Stroke ([place])   . Tobacco abuse     Past Surgical History:  Procedure Laterality Date  . CATARACT EXTRACTION Bilateral   . IR GENERIC HISTORICAL  07/28/2016   IR RADIOLOGIST EVAL & MGMT 07/28/2016 [facility]INTER[facility] RAD  . KNEE ARTHROSCOPY WITH MEDIAL MENISECTOMY Left 12/01/2017   Procedure: LEFT KNEE ARTHROSCOPY WITH PARTIAL MEDIAL AND LATERAL MENISECTOMY;  Surgeon: Carole Civil, MD;  Location: [facility];  Service: Orthopedics;  Laterality: Left;  . ORIF ULNAR FRACTURE Left   . ROTATOR CUFF REPAIR Left   . TONSILLECTOMY    . TUBAL LIGATION    . VAGINAL HYSTERECTOMY     has her ovaries  . VARICOSE VEIN SURGERY Right     There were no vitals filed for this visit.  Subjective Assessment - 02/27/18 0904    Subjective  Patient reported less pain today in back    Pertinent History  L knee arthroscopy (partial med/lat meniscectomy);  stroke (3 TIA - 2015/2016/2017), brain aneurysm, L RCR, Rod L lower arm    Limitations  Sitting    How long can you sit comfortably?  10 min    How long can you walk comfortably?  4 hours (up on feet)    Patient Stated Goals  Get out of pain.    Currently in Pain?  Yes    Pain Score  5     Pain Location  Back    Pain Orientation  Left    Pain Descriptors / Indicators  Sore;Discomfort    Pain Type  Acute pain    Pain Onset  More than a month ago    Pain Frequency  Intermittent    Aggravating Factors   prolong activity    Pain Relieving Factors  at rest                       [facility] Adult PT Treatment/Exercise - 02/27/18 0001      Lumbar Exercises: Aerobic   Stationary Bike  L1 x46mn      Knee/Hip Exercises: Stretches   Hip Flexor Stretch  Left;3 reps;20 seconds      Knee/Hip Exercises: Standing  Heel Raises  Both;10 reps;2 sets    Hip Abduction  Stengthening;Both;2 sets;10 reps    Hip Extension  Stengthening;Both;2 sets;10 reps      Moist Heat Therapy   Number Minutes Moist Heat  15 Minutes    Moist Heat Location  Lumbar Spine      Electrical Stimulation   Electrical Stimulation Location  Left SIJ and Piriformis region.    Electrical Stimulation Action  premod    Electrical Stimulation Parameters  80-150hz  x84mn    Electrical Stimulation Goals  Pain      Ultrasound   Ultrasound Location  left QL    Ultrasound Parameters  1.5w/cm2/100%/169m x1028m   Ultrasound Goals  Pain                  PT Long Term Goals - 02/15/18 [id number]   PT LONG TERM GOAL #1   Title  Pt independent with HEP    Time  8    Period  Weeks    Status  On-going      PT LONG TERM GOAL #2   Title  Patient able to peform ADLS with pain 12/26/80 less in the back and LLE.    Time  8    Period  Weeks    Status  On-going      PT LONG TERM GOAL #3   Title  Patient able to sit for 30 min comfortably.    Time  8    Period  Weeks    Status  On-going      PT LONG TERM  GOAL #4   Title  Patient to demo 5/5 BLE strength to improve function.    Time  8    Period  Weeks    Status  On-going            Plan - 02/27/18 0910    Clinical Impression Statement  Patient tolerated treatment well today and feels improvement overall. Patient has no complaints with knee yet some ongoing discomfort in back left side. Patient feels less pain overall today and able to perform ADL's with greater ease. Patient able to sit longer with comfort. Goals progressing.     Rehab Potential  Good    PT Frequency  2x / week    PT Duration  8 weeks    PT Treatment/Interventions  ADLs/Self Care Home Management;Cryotherapy;Electrical Stimulation;Ultrasound;Moist Heat;Therapeutic activities;Therapeutic exercise;Patient/family education;Passive range of motion;Manual techniques;Dry needling;Traction;Neuromuscular re-education    PT Next Visit Plan  Hold aggressive manual therapy to piriformis; Try long leg traction and positional traction for low back; possibly DN once inflammation goes down.    Consulted and Agree with Plan of Care  Patient       Patient will benefit from skilled therapeutic intervention in order to improve the following deficits and impairments:  Decreased strength, Pain, Decreased activity tolerance, Decreased range of motion, Postural dysfunction  Visit Diagnosis: Acute left-sided low back pain with left-sided sciatica     Problem List Patient Active Problem List   Diagnosis Date Noted  . S/P left knee arthroscopy 12/01/17 12/08/2017  . Derangement of posterior horn of medial meniscus of left knee   . Derangement of anterior horn of lateral meniscus of left knee   . Multiple pulmonary nodules 07/07/2016  . Dyspnea 07/07/2016  . TIA (transient ischemic attack)   . Left sided numbness 11/02/2014  . Tobacco use 11/02/2014  . Bradycardia 11/02/2014  . Brain aneurysm 11/02/2014  .  Crohn disease ([place]) 11/02/2014  . Fibromyalgia 11/02/2014  . Left-sided  weakness 11/02/2014  . Chronic anxiety 11/02/2014  . CVA (cerebral infarction) 07/10/2013  . Dizziness 05/05/2012  . Anxiety state, unspecified 12/11/2009  . TOBACCO ABUSE 12/11/2009  . Subjective visual disturbance 12/11/2009  . Myalgia and myositis 12/11/2009  . PALPITATIONS 12/11/2009  . Precordial pain 12/11/2009    Phillips Climes, PTA 02/27/2018, 10:21 AM  [facility] [address] [place], [place], [address] Phone: [phone]   Fax:  [phone]  Name: BETHA SHADIX MRN: [id number] Date of Birth: June 08, 1951

## 2018-03-01 ENCOUNTER — Ambulatory Visit: Payer: BLUE CROSS/BLUE SHIELD | Admitting: Physical Therapy

## 2018-03-01 ENCOUNTER — Encounter: Payer: Self-pay | Admitting: Physical Therapy

## 2018-03-01 DIAGNOSIS — M5442 Lumbago with sciatica, left side: Secondary | ICD-10-CM | POA: Diagnosis not present

## 2018-03-01 NOTE — Patient Instructions (Signed)
    Pelvic Tilt: Posterior - Legs Bent (Supine)  Tighten stomach and flatten back by rolling pelvis down. Hold _10___ seconds. Relax. Repeat _10-30___ times per set. Do __2__ sets per session. Do _2___ sessions per day.   Straight Leg Raise  Tighten stomach and slowly raise locked right leg __4__ inches from floor. Repeat __10-30__ times per set. Do __2__ sets per session. Do __2__ sessions per day.  Bridging  Slowly raise buttocks from floor, keeping stomach tight. Repeat _10___ times per set. Do __2__ sets per session. Do __2__ sessions per day.

## 2018-03-01 NOTE — Therapy (Signed)
[facility] [place], [place], [address] Phone: [phone]   Fax:  [phone]  Physical Therapy Treatment  Patient Details  Name: Stacy Hansen MRN: [id number] Date of Birth: 04/28/1951 Referring Provider: Arther Abbott, MD   Encounter Date: 03/01/2018  PT End of Session - 03/01/18 0923    Visit Number  10    Number of Visits  18    Date for PT Re-Evaluation  03/13/18    PT Start Time  0859    PT Stop Time  0947    PT Time Calculation (min)  48 min    Activity Tolerance  Patient tolerated treatment well    Behavior During Therapy  [facility] for tasks assessed/performed       Past Medical History:  Diagnosis Date  . Anxiety   . Atypical chest pain   . Brain aneurysm   . Chronic abdominal pain   . Crohn disease ([place])   . Crohn's colitis ([place])   . Depression   . Fibromyalgia   . GERD (gastroesophageal reflux disease)   . Headache   . 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 History of fibromyalgia   . Hyperplastic colon polyp   . Inflammatory bowel disease   . Panic attacks   . Stroke ([place])   . Tobacco abuse     Past Surgical History:  Procedure Laterality Date  . CATARACT EXTRACTION Bilateral   . IR GENERIC HISTORICAL  07/28/2016   IR RADIOLOGIST EVAL & MGMT 07/28/2016 [facility]  . KNEE ARTHROSCOPY WITH MEDIAL MENISECTOMY Left 12/01/2017   Procedure: LEFT KNEE ARTHROSCOPY WITH PARTIAL MEDIAL AND LATERAL MENISECTOMY;  Surgeon: Carole Civil, MD;  Location: [facility];  Service: Orthopedics;  Laterality: Left;  . ORIF ULNAR FRACTURE Left   . ROTATOR CUFF REPAIR Left   . TONSILLECTOMY    . TUBAL LIGATION    . VAGINAL HYSTERECTOMY     has her ovaries  . VARICOSE VEIN SURGERY Right     There were no vitals filed for this visit.  Subjective Assessment - 03/01/18 0901    Subjective  Patient reported doing better each visit    Pertinent History  L knee arthroscopy (partial med/lat meniscectomy);  stroke (3 TIA - 2015/2016/2017), brain aneurysm, L RCR, Rod L lower arm    Limitations  Sitting    How long can you sit comfortably?  10 min    How long can you walk comfortably?  4 hours (up on feet)    Patient Stated Goals  Get out of pain.    Pain Score  4     Pain Location  Back    Pain Orientation  Left    Pain Descriptors / Indicators  Sore    Pain Type  Acute pain    Pain Onset  More than a month ago    Pain Frequency  Intermittent    Aggravating Factors   prolong activity    Pain Relieving Factors  at rest                       [facility] Adult PT Treatment/Exercise - 03/01/18 0001      Lumbar Exercises: Aerobic   Recumbent Bike  L5 x52mn UE/LE      Lumbar Exercises: Supine   Bridge  20 reps;3 seconds    Straight Leg Raise  20 reps;3 seconds      Knee/Hip Exercises: Sidelying  Hip ABduction  Strengthening;Left;20 reps      Moist Heat Therapy   Number Minutes Moist Heat  15 Minutes    Moist Heat Location  Lumbar Spine      Electrical Stimulation   Electrical Stimulation Location  Left SIJ and Piriformis region.    Electrical Stimulation Action  premod    Electrical Stimulation Parameters  80-_0  x9mn    Electrical Stimulation Goals  Pain      Ultrasound   Ultrasound Location  left QL    Ultrasound Parameters  1.5w/cm2/100%/150m x1013m   Ultrasound Goals  Pain             PT Education - 03/01/18 0922    Education provided  Yes    Education Details  HEP    Person(s) Educated  Patient    Methods  Explanation;Demonstration;Handout    Comprehension  Verbalized understanding;Returned demonstration          PT Long Term Goals - 03/01/18 0930      PT LONG TERM GOAL #1   Title  Pt independent with HEP    Time  8    Period  Weeks    Status  Achieved      PT LONG TERM GOAL #2   Title  Patient able to peform ADLS with pain 01/01/73 less in the back and LLE.    Time  8    Period  Weeks    Status  On-going      PT LONG TERM GOAL  #3   Title  Patient able to sit for 30 min comfortably.    Time  8    Period  Weeks    Status  On-going      PT LONG TERM GOAL #4   Title  Patient to demo 5/5 BLE strength to improve function.    Time  8    Period  Weeks    Status  On-going            Plan - 03/01/18 0931    Clinical Impression Statement  Patient tolerated treatment well today. Patient continues to progress with exercises and reported decreased discomfort. HEP given today for self progression. Patient able to perform ADL's with greater ease and has less palpable pain in back. No discomfort in knee. Met LTG #1 other goals ongoing.     Rehab Potential  Good    PT Frequency  2x / week    PT Duration  8 weeks    PT Treatment/Interventions  ADLs/Self Care Home Management;Cryotherapy;Electrical Stimulation;Ultrasound;Moist Heat;Therapeutic activities;Therapeutic exercise;Patient/family education;Passive range of motion;Manual techniques;Dry needling;Traction;Neuromuscular re-education    PT Next Visit Plan  cont with POC    Consulted and Agree with Plan of Care  Patient       Patient will benefit from skilled therapeutic intervention in order to improve the following deficits and impairments:  Decreased strength, Pain, Decreased activity tolerance, Decreased range of motion, Postural dysfunction  Visit Diagnosis: Acute left-sided low back pain with left-sided sciatica     Problem List Patient Active Problem List   Diagnosis Date Noted  . S/P left knee arthroscopy 12/01/17 12/08/2017  . Derangement of posterior horn of medial meniscus of left knee   . Derangement of anterior horn of lateral meniscus of left knee   . Multiple pulmonary nodules 07/07/2016  . Dyspnea 07/07/2016  . TIA (transient ischemic attack)   . Left sided numbness 11/02/2014  . Tobacco use 11/02/2014  . Bradycardia 11/02/2014  .  Brain aneurysm 11/02/2014  . Crohn disease ([place]) 11/02/2014  . Fibromyalgia 11/02/2014  . Left-sided weakness  11/02/2014  . Chronic anxiety 11/02/2014  . CVA (cerebral infarction) 07/10/2013  . Dizziness 05/05/2012  . Anxiety state, unspecified 12/11/2009  . TOBACCO ABUSE 12/11/2009  . Subjective visual disturbance 12/11/2009  . Myalgia and myositis 12/11/2009  . PALPITATIONS 12/11/2009  . Precordial pain 12/11/2009    Phillips Climes, PTA 03/01/2018, 10:31 AM  [facility] [place], [place], [address] Phone: [phone]   Fax:  [phone]  Name: FYNN ADEL MRN: [id number] Date of Birth: 12/03/1950

## 2018-03-02 ENCOUNTER — Encounter: Payer: Self-pay | Admitting: Physical Therapy

## 2018-03-02 ENCOUNTER — Ambulatory Visit: Payer: BLUE CROSS/BLUE SHIELD | Admitting: Physical Therapy

## 2018-03-02 DIAGNOSIS — M5442 Lumbago with sciatica, left side: Secondary | ICD-10-CM | POA: Diagnosis not present

## 2018-03-02 NOTE — Therapy (Signed)
[facility] [place], [place], [address] Phone: [phone]   Fax:  [phone]  Physical Therapy Treatment  Patient Details  Name: Stacy Hansen MRN: [id number] Date of Birth: 01-03-1951 Referring Provider: Arther Abbott, MD   Encounter Date: 03/02/2018  PT End of Session - 03/02/18 0919    Visit Number  11    Number of Visits  18    Date for PT Re-Evaluation  03/13/18    PT Start Time  0858    PT Stop Time  0949    PT Time Calculation (min)  51 min    Activity Tolerance  Patient tolerated treatment well    Behavior During Therapy  [facility] for tasks assessed/performed       Past Medical History:  Diagnosis Date  . Anxiety   . Atypical chest pain   . Brain aneurysm   . Chronic abdominal pain   . Crohn disease ([place])   . Crohn's colitis ([place])   . Depression   . Fibromyalgia   . GERD (gastroesophageal reflux disease)   . Headache   . 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 History of fibromyalgia   . Hyperplastic colon polyp   . Inflammatory bowel disease   . Panic attacks   . Stroke ([place])   . Tobacco abuse     Past Surgical History:  Procedure Laterality Date  . CATARACT EXTRACTION Bilateral   . IR GENERIC HISTORICAL  07/28/2016   IR RADIOLOGIST EVAL & MGMT 07/28/2016 [facility]-INTERV RAD  . KNEE ARTHROSCOPY WITH MEDIAL MENISECTOMY Left 12/01/2017   Procedure: LEFT KNEE ARTHROSCOPY WITH PARTIAL MEDIAL AND LATERAL MENISECTOMY;  Surgeon: Carole Civil, MD;  Location: [facility];  Service: Orthopedics;  Laterality: Left;  . ORIF ULNAR FRACTURE Left   . ROTATOR CUFF REPAIR Left   . TONSILLECTOMY    . TUBAL LIGATION    . VAGINAL HYSTERECTOMY     has her ovaries  . VARICOSE VEIN SURGERY Right     There were no vitals filed for this visit.  Subjective Assessment - 03/02/18 0904    Subjective  Patient reported doing better each visit    Pertinent History  L knee arthroscopy (partial med/lat meniscectomy);  stroke (3 TIA - 2015/2016/2017), brain aneurysm, L RCR, Rod L lower arm    Limitations  Sitting    How long can you sit comfortably?  10 min    How long can you walk comfortably?  4 hours (up on feet)    Patient Stated Goals  Get out of pain.    Currently in Pain?  Yes    Pain Score  4     Pain Location  Back    Pain Orientation  Left    Pain Onset  More than a month ago    Pain Frequency  Intermittent    Aggravating Factors   prolong actiity    Pain Relieving Factors  at rest                       [facility] Adult PT Treatment/Exercise - 03/02/18 0001      Lumbar Exercises: Aerobic   Recumbent Bike  L5 x67mn UE/LE      Lumbar Exercises: Supine   Bridge  20 reps;3 seconds    Straight Leg Raise  20 reps;3 seconds    Other Supine Lumbar Exercises  clam with red t-band x30  Knee/Hip Exercises: Standing   Hip Abduction  Stengthening;Left;20 reps    Abduction Limitations  yellow t-band    Hip Extension  Stengthening;Left;20 reps    Extension Limitations  yellow t band      Moist Heat Therapy   Number Minutes Moist Heat  15 Minutes    Moist Heat Location  Lumbar Spine      Electrical Stimulation   Electrical Stimulation Location  Left SIJ and Piriformis region.    Electrical Stimulation Action  premod    Electrical Stimulation Parameters  80-150hz  x31mn    Electrical Stimulation Goals  Pain      Ultrasound   Ultrasound Location  left QL    Ultrasound Parameters  1.5w/cm2/100%/116m x1055m   Ultrasound Goals  Pain             PT Education - 03/01/18 0922    Education provided  Yes    Education Details  HEP    Person(s) Educated  Patient    Methods  Explanation;Demonstration;Handout    Comprehension  Verbalized understanding;Returned demonstration          PT Long Term Goals - 03/01/18 0930      PT LONG TERM GOAL #1   Title  Pt independent with HEP    Time  8    Period  Weeks    Status  Achieved      PT LONG TERM GOAL #2   Title   Patient able to peform ADLS with pain 12/27/57 less in the back and LLE.    Time  8    Period  Weeks    Status  On-going      PT LONG TERM GOAL #3   Title  Patient able to sit for 30 min comfortably.    Time  8    Period  Weeks    Status  On-going      PT LONG TERM GOAL #4   Title  Patient to demo 5/5 BLE strength to improve function.    Time  8    Period  Weeks    Status  On-going            Plan - 03/02/18 0926    Clinical Impression Statement  Patient tolerated treatment well today. Patient progressing with all activities and progressing with exercises. Patient continues to report relief in left low back and no complaints in knee. Goals progressing due to limitations with discomfort with prolong activity.    Rehab Potential  Good    PT Frequency  2x / week    PT Duration  8 weeks    PT Treatment/Interventions  ADLs/Self Care Home Management;Cryotherapy;Electrical Stimulation;Ultrasound;Moist Heat;Therapeutic activities;Therapeutic exercise;Patient/family education;Passive range of motion;Manual techniques;Dry needling;Traction;Neuromuscular re-education    PT Next Visit Plan  cont with POC    Consulted and Agree with Plan of Care  Patient       Patient will benefit from skilled therapeutic intervention in order to improve the following deficits and impairments:  Decreased strength, Pain, Decreased activity tolerance, Decreased range of motion, Postural dysfunction  Visit Diagnosis: Acute left-sided low back pain with left-sided sciatica     Problem List Patient Active Problem List   Diagnosis Date Noted  . S/P left knee arthroscopy 12/01/17 12/08/2017  . Derangement of posterior horn of medial meniscus of left knee   . Derangement of anterior horn of lateral meniscus of left knee   . Multiple pulmonary nodules 07/07/2016  . Dyspnea 07/07/2016  .  TIA (transient ischemic attack)   . Left sided numbness 11/02/2014  . Tobacco use 11/02/2014  . Bradycardia 11/02/2014   . Brain aneurysm 11/02/2014  . Crohn disease ([place]) 11/02/2014  . Fibromyalgia 11/02/2014  . Left-sided weakness 11/02/2014  . Chronic anxiety 11/02/2014  . CVA (cerebral infarction) 07/10/2013  . Dizziness 05/05/2012  . Anxiety state, unspecified 12/11/2009  . TOBACCO ABUSE 12/11/2009  . Subjective visual disturbance 12/11/2009  . Myalgia and myositis 12/11/2009  . PALPITATIONS 12/11/2009  . Precordial pain 12/11/2009    Phillips Climes, PTA 03/02/2018, 10:15 AM  [facility] [place], [place], [address] Phone: [phone]   Fax:  [phone]  Name: Stacy Hansen MRN: [id number] Date of Birth: [birth year]

## 2018-03-06 ENCOUNTER — Encounter: Payer: BLUE CROSS/BLUE SHIELD | Admitting: Physical Therapy

## 2018-03-08 ENCOUNTER — Encounter: Payer: Self-pay | Admitting: Physical Therapy

## 2018-03-08 ENCOUNTER — Ambulatory Visit: Payer: BLUE CROSS/BLUE SHIELD | Admitting: Physical Therapy

## 2018-03-08 DIAGNOSIS — M5442 Lumbago with sciatica, left side: Secondary | ICD-10-CM | POA: Diagnosis not present

## 2018-03-08 NOTE — Therapy (Signed)
[facility] [place], [place], [address] Phone: [phone]   Fax:  [phone]  Physical Therapy Treatment  Patient Details  Name: Stacy Hansen MRN: [id number] Date of Birth: 1951/06/08 Referring Provider: Arther Abbott, MD   Encounter Date: 03/08/2018  PT End of Session - 03/08/18 0921    Visit Number  12    Number of Visits  18    Date for PT Re-Evaluation  03/13/18    PT Start Time  0901    PT Stop Time  0954    PT Time Calculation (min)  53 min    Activity Tolerance  Patient tolerated treatment well    Behavior During Therapy  [facility] for tasks assessed/performed       Past Medical History:  Diagnosis Date  . Anxiety   . Atypical chest pain   . Brain aneurysm   . Chronic abdominal pain   . Crohn disease ([place])   . Crohn's colitis ([place])   . Depression   . Fibromyalgia   . GERD (gastroesophageal reflux disease)   . Headache   . 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 History of fibromyalgia   . Hyperplastic colon polyp   . Inflammatory bowel disease   . Panic attacks   . Stroke ([place])   . Tobacco abuse     Past Surgical History:  Procedure Laterality Date  . CATARACT EXTRACTION Bilateral   . IR GENERIC HISTORICAL  07/28/2016   IR RADIOLOGIST EVAL & MGMT 07/28/2016 [facility]  . KNEE ARTHROSCOPY WITH MEDIAL MENISECTOMY Left 12/01/2017   Procedure: LEFT KNEE ARTHROSCOPY WITH PARTIAL MEDIAL AND LATERAL MENISECTOMY;  Surgeon: Carole Civil, MD;  Location: [facility];  Service: Orthopedics;  Laterality: Left;  . ORIF ULNAR FRACTURE Left   . ROTATOR CUFF REPAIR Left   . TONSILLECTOMY    . TUBAL LIGATION    . VAGINAL HYSTERECTOMY     has her ovaries  . VARICOSE VEIN SURGERY Right     There were no vitals filed for this visit.  Subjective Assessment - 03/08/18 0910    Subjective  Patient reported doing better each visit    Pertinent History  L knee arthroscopy (partial med/lat meniscectomy);  stroke (3 TIA - 2015/2016/2017), brain aneurysm, L RCR, Rod L lower arm    Limitations  Sitting    How long can you sit comfortably?  10 min    How long can you walk comfortably?  4 hours (up on feet)    Patient Stated Goals  Get out of pain.    Currently in Pain?  Yes    Pain Score  3     Pain Location  Back    Pain Orientation  Left    Pain Type  Acute pain    Pain Onset  More than a month ago    Pain Frequency  Intermittent    Aggravating Factors   prolong activity    Pain Relieving Factors  at rest                       [facility] Adult PT Treatment/Exercise - 03/08/18 0001      Lumbar Exercises: Aerobic   Recumbent Bike  L5 x65mn UE/LE      Lumbar Exercises: Supine   Bridge with Ball Squeeze  20 reps;3 seconds    Straight Leg Raise  20 reps;3 seconds    Other Supine Lumbar Exercises  clam with red t-band x30      Knee/Hip Exercises: Standing   Hip Abduction  Stengthening;Left;20 reps    Abduction Limitations  yellow t-band    Hip Extension  Stengthening;Left;20 reps    Extension Limitations  yellow t band      Moist Heat Therapy   Number Minutes Moist Heat  15 Minutes    Moist Heat Location  Lumbar Spine      Electrical Stimulation   Electrical Stimulation Location  Left SIJ and Piriformis region.    Electrical Stimulation Action  preomd    Printmaker Parameters  80-150hz  x45mn    Electrical Stimulation Goals  Pain      Ultrasound   Ultrasound Location  left QL    Ultrasound Parameters  1.5w/cm2100%/15m x1028m   Ultrasound Goals  Pain                  PT Long Term Goals - 03/01/18 0930      PT LONG TERM GOAL #1   Title  Pt independent with HEP    Time  8    Period  Weeks    Status  Achieved      PT LONG TERM GOAL #2   Title  Patient able to peform ADLS with pain 12/30/62 less in the back and LLE.    Time  8    Period  Weeks    Status  On-going      PT LONG TERM GOAL #3   Title  Patient able to sit for 30 min  comfortably.    Time  8    Period  Weeks    Status  On-going      PT LONG TERM GOAL #4   Title  Patient to demo 5/5 BLE strength to improve function.    Time  8    Period  Weeks    Status  On-going            Plan - 03/08/18 0923329 Clinical Impression Statement  Patient tolerated treatment well today. Patient continues to report decrease discomfort each week and feels overall improvement. Patient is able to perform ADL's with greater ease and able to stay in any one position for prolong time. Patient close to meeting goals.     Rehab Potential  Good    PT Frequency  2x / week    PT Duration  8 weeks    PT Treatment/Interventions  ADLs/Self Care Home Management;Cryotherapy;Electrical Stimulation;Ultrasound;Moist Heat;Therapeutic activities;Therapeutic exercise;Patient/family education;Passive range of motion;Manual techniques;Dry needling;Traction;Neuromuscular re-education    PT Next Visit Plan  cont with POC and may consider DC next treatment per patient    Consulted and Agree with Plan of Care  Patient       Patient will benefit from skilled therapeutic intervention in order to improve the following deficits and impairments:  Decreased strength, Pain, Decreased activity tolerance, Decreased range of motion, Postural dysfunction  Visit Diagnosis: Acute left-sided low back pain with left-sided sciatica     Problem List Patient Active Problem List   Diagnosis Date Noted  . S/P left knee arthroscopy 12/01/17 12/08/2017  . Derangement of posterior horn of medial meniscus of left knee   . Derangement of anterior horn of lateral meniscus of left knee   . Multiple pulmonary nodules 07/07/2016  . Dyspnea 07/07/2016  . TIA (transient ischemic attack)   . Left sided numbness 11/02/2014  . Tobacco use 11/02/2014  . Bradycardia 11/02/2014  .  Brain aneurysm 11/02/2014  . Crohn disease ([place]) 11/02/2014  . Fibromyalgia 11/02/2014  . Left-sided weakness 11/02/2014  . Chronic  anxiety 11/02/2014  . CVA (cerebral infarction) 07/10/2013  . Dizziness 05/05/2012  . Anxiety state, unspecified 12/11/2009  . TOBACCO ABUSE 12/11/2009  . Subjective visual disturbance 12/11/2009  . Myalgia and myositis 12/11/2009  . PALPITATIONS 12/11/2009  . Precordial pain 12/11/2009    Phillips Climes, PTA 03/08/2018, 10:06 AM  [facility] [place], [place], [address] Phone: [phone]   Fax:  [phone]  Name: Stacy Hansen MRN: [id number] Date of Birth: Mar 15, 1951

## 2018-03-09 ENCOUNTER — Inpatient Hospital Stay (HOSPITAL_COMMUNITY): Admission: RE | Admit: 2018-03-09 | Payer: BLUE CROSS/BLUE SHIELD | Source: Ambulatory Visit

## 2018-03-10 ENCOUNTER — Encounter: Payer: Self-pay | Admitting: Physical Therapy

## 2018-03-10 ENCOUNTER — Ambulatory Visit: Payer: BLUE CROSS/BLUE SHIELD | Admitting: Physical Therapy

## 2018-03-10 DIAGNOSIS — M5442 Lumbago with sciatica, left side: Secondary | ICD-10-CM

## 2018-03-10 NOTE — Therapy (Signed)
[facility] [place], [place], [address] Phone: [phone]   Fax:  [phone]  Physical Therapy Treatment  Patient Details  Name: Stacy Hansen MRN: [id number] Date of Birth: 1951-07-11 Referring Provider: Arther Abbott, MD   Encounter Date: 03/10/2018  PT End of Session - 03/10/18 0917    Visit Number  13    Number of Visits  18    Date for PT Re-Evaluation  03/13/18    PT Start Time  0900    PT Stop Time  0938    PT Time Calculation (min)  38 min    Activity Tolerance  Patient tolerated treatment well    Behavior During Therapy  [facility] for tasks assessed/performed       Past Medical History:  Diagnosis Date  . Anxiety   . Atypical chest pain   . Brain aneurysm   . Chronic abdominal pain   . Crohn disease ([place])   . Crohn's colitis ([place])   . Depression   . Fibromyalgia   . GERD (gastroesophageal reflux disease)   . Headache   . 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 History of fibromyalgia   . Hyperplastic colon polyp   . Inflammatory bowel disease   . Panic attacks   . Stroke ([place])   . Tobacco abuse     Past Surgical History:  Procedure Laterality Date  . CATARACT EXTRACTION Bilateral   . IR GENERIC HISTORICAL  07/28/2016   IR RADIOLOGIST EVAL & MGMT 07/28/2016 [facility]  . KNEE ARTHROSCOPY WITH MEDIAL MENISECTOMY Left 12/01/2017   Procedure: LEFT KNEE ARTHROSCOPY WITH PARTIAL MEDIAL AND LATERAL MENISECTOMY;  Surgeon: Carole Civil, MD;  Location: [facility];  Service: Orthopedics;  Laterality: Left;  . ORIF ULNAR FRACTURE Left   . ROTATOR CUFF REPAIR Left   . TONSILLECTOMY    . TUBAL LIGATION    . VAGINAL HYSTERECTOMY     has her ovaries  . VARICOSE VEIN SURGERY Right     There were no vitals filed for this visit.  Subjective Assessment - 03/10/18 0856    Subjective  Reports today is D/C day. Reports that the only real discomfort when kneeling.    Pertinent History  L knee  arthroscopy (partial med/lat meniscectomy); stroke (3 TIA - 2015/2016/2017), brain aneurysm, L RCR, Rod L lower arm    Limitations  Sitting    How long can you sit comfortably?  10 min    How long can you walk comfortably?  4 hours (up on feet)    Patient Stated Goals  Get out of pain.    Currently in Pain?  No/denies         [facility] PT Assessment - 03/10/18 0001      Assessment   Medical Diagnosis  s/p L knee arthroscopy    Onset Date/Surgical Date  12/01/17    Next MD Visit  03/12/18      Precautions   Precautions  None      ROM / Strength   AROM / PROM / Strength  Strength      Strength   Overall Strength  Deficits    Strength Assessment Site  Knee;Hip    Right/Left Hip  Right;Left    Right Hip Flexion  4+/5    Right Hip ABduction  5/5    Left Hip Flexion  4+/5    Left Hip ABduction  5/5 a lttle pain in mid thigh to  L low back    Right/Left Knee  Right;Left    Right Knee Flexion  4+/5    Right Knee Extension  5/5    Left Knee Flexion  4+/5    Left Knee Extension  4/5                   [facility] Adult PT Treatment/Exercise - 03/10/18 0001      Lumbar Exercises: Aerobic   Nustep  L5 x10 min      Lumbar Exercises: Supine   Bridge with Ball Squeeze  20 reps;3 seconds    Straight Leg Raise  20 reps;3 seconds with core activation      Knee/Hip Exercises: Standing   Terminal Knee Extension  Strengthening;Left;20 reps;Theraband    Theraband Level (Terminal Knee Extension)  Level 2 (Red)    Hip Abduction  Stengthening;Left;20 reps    Abduction Limitations  red theraband    Hip Extension  Stengthening;Left;20 reps    Extension Limitations  red theraband      Knee/Hip Exercises: Supine   Straight Leg Raises  --                  PT Long Term Goals - 03/10/18 0929      PT LONG TERM GOAL #1   Title  Pt independent with HEP    Time  8    Period  Weeks    Status  Achieved      PT LONG TERM GOAL #2   Title  Patient able to peform ADLS with pain  6/50 or less in the back and LLE.    Time  8    Period  Weeks    Status  Achieved      PT LONG TERM GOAL #3   Title  Patient able to sit for 30 min comfortably.    Time  8    Period  Weeks    Status  Achieved      PT LONG TERM GOAL #4   Title  Patient to demo 5/5 BLE strength to improve function.    Time  8    Period  Weeks    Status  Partially Met 4/5-5/5 B hip/knee strength as of 03/10/2018            Plan - 03/10/18 0949    Clinical Impression Statement  Patient able to tolerate today's treatment well with only very intermittant reports of L low back discomfort. Patient denies any pain upon arrival but reports discomfort around 2/10 in low back and L knee with ADLs. Patient able to complete exercises with resistance and with draw in VCs for core activation. Patient able to achieve all goals except for LE strength goal. Patient denied modalities at end of treatment.    Rehab Potential  Good    PT Frequency  2x / week    PT Duration  8 weeks    PT Treatment/Interventions  ADLs/Self Care Home Management;Cryotherapy;Electrical Stimulation;Ultrasound;Moist Heat;Therapeutic activities;Therapeutic exercise;Patient/family education;Passive range of motion;Manual techniques;Dry needling;Traction;Neuromuscular re-education    PT Next Visit Plan  D/C summary required.    PT Home Exercise Plan  HEP- UT, Levator Scapula stretches    Consulted and Agree with Plan of Care  Patient       Patient will benefit from skilled therapeutic intervention in order to improve the following deficits and impairments:  Decreased strength, Pain, Decreased activity tolerance, Decreased range of motion, Postural dysfunction  Visit Diagnosis: Acute left-sided low back pain  with left-sided sciatica     Problem List Patient Active Problem List   Diagnosis Date Noted  . S/P left knee arthroscopy 12/01/17 12/08/2017  . Derangement of posterior horn of medial meniscus of left knee   . Derangement of  anterior horn of lateral meniscus of left knee   . Multiple pulmonary nodules 07/07/2016  . Dyspnea 07/07/2016  . TIA (transient ischemic attack)   . Left sided numbness 11/02/2014  . Tobacco use 11/02/2014  . Bradycardia 11/02/2014  . Brain aneurysm 11/02/2014  . Crohn disease ([place]) 11/02/2014  . Fibromyalgia 11/02/2014  . Left-sided weakness 11/02/2014  . Chronic anxiety 11/02/2014  . CVA (cerebral infarction) 07/10/2013  . Dizziness 05/05/2012  . Anxiety state, unspecified 12/11/2009  . TOBACCO ABUSE 12/11/2009  . Subjective visual disturbance 12/11/2009  . Myalgia and myositis 12/11/2009  . PALPITATIONS 12/11/2009  . Precordial pain 12/11/2009    Standley Brooking, PTA 03/10/18 10:04 AM   [facility] [address] [place], [place], [address] Phone: [phone]   Fax:  [phone]  Name: Stacy Hansen MRN: [id number] Date of Birth: 05-14-1951  PHYSICAL THERAPY DISCHARGE SUMMARY  Visits from Start of Care: 13.  Current functional level related to goals / functional outcomes: See above.   Remaining deficits: Excellent job overall with goal #4 unmet.   Education / Equipment: HEP. Plan: Patient agrees to discharge.  Patient goals were partially met. Patient is being discharged due to being pleased with the current functional level.  ?????         Mali Applegate MPT

## 2018-05-10 ENCOUNTER — Encounter: Payer: Self-pay | Admitting: Neurology

## 2018-05-11 ENCOUNTER — Telehealth (HOSPITAL_COMMUNITY): Payer: Self-pay | Admitting: Radiology

## 2018-05-11 ENCOUNTER — Other Ambulatory Visit (HOSPITAL_COMMUNITY): Payer: Self-pay | Admitting: Interventional Radiology

## 2018-05-11 DIAGNOSIS — R42 Dizziness and giddiness: Secondary | ICD-10-CM

## 2018-05-11 DIAGNOSIS — I639 Cerebral infarction, unspecified: Secondary | ICD-10-CM

## 2018-05-11 NOTE — Telephone Encounter (Signed)
Pt called and states that she is having dizzy spells again, unsteady gait, the room spinning, and just does not feel right. Patient was not due for follow-up until 2020 but now that she has developed sx she will need to be followed up on now. Will send in insurance auth request for MRI/MRA today. JM

## 2018-05-12 ENCOUNTER — Telehealth (HOSPITAL_COMMUNITY): Payer: Self-pay | Admitting: Radiology

## 2018-05-12 NOTE — Telephone Encounter (Signed)
Called pt to schedule MRI/MRA. Pt will call Caryl Pina back on Monday 05/15/18 with her scheduled days off of work to set up this appointment. She is also requesting medication for anxiety prior to her MRI. Please notify Radonna Ricker, [place] when this appointment is scheduled so that she can order the patient's medication. Note left on Ashley's desk for Monday! JM

## 2018-05-17 ENCOUNTER — Telehealth: Payer: Self-pay | Admitting: Student

## 2018-05-17 ENCOUNTER — Other Ambulatory Visit: Payer: Self-pay | Admitting: Student

## 2018-05-17 NOTE — Progress Notes (Signed)
Order placed for Ativan 69m IV to be administered 20 minutes prior to MRI/MRA scheduled for 05/24/2018 due to patient's anxiety.  ABea GraffLouk, PA-C 05/17/2018, 11:34 AM

## 2018-05-24 ENCOUNTER — Ambulatory Visit (HOSPITAL_COMMUNITY)
Admission: RE | Admit: 2018-05-24 | Discharge: 2018-05-24 | Disposition: A | Discharge status: Home / Self Care | Payer: Medicare Other | Source: Ambulatory Visit | Attending: Interventional Radiology | Admitting: Interventional Radiology

## 2018-05-24 ENCOUNTER — Encounter (HOSPITAL_COMMUNITY): Payer: Self-pay

## 2018-05-24 ENCOUNTER — Ambulatory Visit (HOSPITAL_COMMUNITY): Payer: Medicare Other

## 2018-05-24 DIAGNOSIS — I639 Cerebral infarction, unspecified: Secondary | ICD-10-CM | POA: Diagnosis present

## 2018-05-24 DIAGNOSIS — R42 Dizziness and giddiness: Secondary | ICD-10-CM | POA: Insufficient documentation

## 2018-05-24 LAB — CREATININE, SERUM
Creatinine, Ser: 0.96 mg/dL (ref 0.44–1.00)
GFR calc Af Amer: >60 mL/min (ref >60)
GFR calc non Af Amer: >60 mL/min (ref >60)

## 2018-05-24 MED ORDER — LORAZEPAM 2 MG/ML IJ SOLN
1.0000 mg | Freq: Once | INTRAMUSCULAR | Status: DC
  Filled 2018-05-24: qty 0.5

## 2018-05-24 MED ORDER — LORAZEPAM 2 MG/ML IJ SOLN
INTRAMUSCULAR | Status: AC
  Filled 2018-05-24: qty 1

## 2018-05-24 MED ORDER — GADOBENATE DIMEGLUMINE 529 MG/ML IV SOLN
10.0000 mL | Freq: Once | INTRAVENOUS | Status: AC
  Administered 2018-05-24: 10 mL via INTRAVENOUS

## 2018-05-25 ENCOUNTER — Telehealth (HOSPITAL_COMMUNITY): Payer: Self-pay | Admitting: Radiology

## 2018-05-30 ENCOUNTER — Telehealth (HOSPITAL_COMMUNITY): Payer: Self-pay

## 2018-05-30 NOTE — Telephone Encounter (Signed)
Pt agreed to f/u in 1 year with mri/mra both w/o contrast. AW

## 2018-06-08 ENCOUNTER — Ambulatory Visit: Payer: Medicare Other | Attending: Neurology | Admitting: Physical Therapy

## 2018-06-08 ENCOUNTER — Encounter: Payer: Self-pay | Admitting: Physical Therapy

## 2018-06-08 ENCOUNTER — Other Ambulatory Visit: Payer: Self-pay

## 2018-06-08 DIAGNOSIS — H8111 Benign paroxysmal vertigo, right ear: Secondary | ICD-10-CM | POA: Diagnosis not present

## 2018-06-08 DIAGNOSIS — R42 Dizziness and giddiness: Secondary | ICD-10-CM | POA: Diagnosis present

## 2018-06-08 DIAGNOSIS — R262 Difficulty in walking, not elsewhere classified: Secondary | ICD-10-CM | POA: Diagnosis present

## 2018-06-08 NOTE — Therapy (Signed)
[facility] [address] [place], [place], [address] Phone: [phone]   Fax:  [phone]  Physical Therapy Evaluation  Patient Details  Name: Stacy Hansen MRN: [id number] Date of Birth: Oct 16, 1951 Referring Provider: Tommas Olp, MD   Encounter Date: 06/08/2018  PT End of Session - 06/08/18 2114    Visit Number  1    Number of Visits  4    Date for PT Re-Evaluation  07/07/18    Authorization Type  [employer] medicare    Authorization Time Period  06/08/18 to 09/06/2018    PT Start Time  1024    PT Stop Time  1104    PT Time Calculation (min)  40 min    Activity Tolerance  Patient tolerated treatment well    Behavior During Therapy  [facility] for tasks assessed/performed       Past Medical History:  Diagnosis Date  . Anxiety   . Atypical chest pain   . Brain aneurysm   . Chronic abdominal pain   . Crohn disease ([place])   . Crohn's colitis ([place])   . Depression   . Fibromyalgia   . GERD (gastroesophageal reflux disease)   . Headache   . 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 History of fibromyalgia   . Hyperplastic colon polyp   . Inflammatory bowel disease   . Panic attacks   . Stroke ([place])   . Tobacco abuse     Past Surgical History:  Procedure Laterality Date  . CATARACT EXTRACTION Bilateral   . IR GENERIC HISTORICAL  07/28/2016   IR RADIOLOGIST EVAL & MGMT 07/28/2016 [facility]-INTER[facility] RAD  . KNEE ARTHROSCOPY WITH MEDIAL MENISECTOMY Left 12/01/2017   Procedure: LEFT KNEE ARTHROSCOPY WITH PARTIAL MEDIAL AND LATERAL MENISECTOMY;  Surgeon: Carole Civil, MD;  Location: [facility];  Service: Orthopedics;  Laterality: Left;  . ORIF ULNAR FRACTURE Left   . ROTATOR CUFF REPAIR Left   . TONSILLECTOMY    . TUBAL LIGATION    . VAGINAL HYSTERECTOMY     has her ovaries  . VARICOSE VEIN SURGERY Right     There were no vitals filed for this visit.   Subjective Assessment - 06/08/18 1027    Subjective  I  thought I had it in 2017. Then I took meclizine a few days and it went away. It's been since May 14th I woke up with it.     Pertinent History  anxiety, atypical chest pain, brain aneurysm, Crohn's , depression, fibromyalgia, congenital rt ear hearing loss,     Diagnostic tests  MRI brain no changes; audiology    Patient Stated Goals  stop having spinning spells    Currently in Pain?  Yes    Pain Score  7     Pain Location  Neck    Pain Orientation  Left    Pain Descriptors / Indicators  Shooting    Pain Type  Chronic pain    Pain Radiating Towards  lt mastoid process    Pain Onset  More than a month ago    Pain Frequency  Intermittent    Pain Relieving Factors  massage         [facility] PT Assessment - 06/08/18 2102      Assessment   Medical Diagnosis  BPPV, left    Referring Provider  Tommas Olp, MD    Onset Date/Surgical Date  03/28/18    Prior Therapy  none  Precautions   Precautions  Fall      Balance Screen   Has the patient fallen in the past 6 months  No    Has the patient had a decrease in activity level because of a fear of falling?   Yes    Is the patient reluctant to leave their home because of a fear of falling?   No      Prior Function   Level of Independence  Independent    Vocation  Part time employment    Vocation Requirements  personal cara attendant      Cognition   Overall Cognitive Status  Within Functional Limits for tasks assessed      Observation/Other Assessments   Focus on Therapeutic Outcomes (FOTO)   not captured           Vestibular Assessment - 06/08/18 1032      Vestibular Assessment   General Observation  when she looks up room spins and sees dark spots; not so much when looking down;       Symptom Behavior   Type of Dizziness  Spinning    Frequency of Dizziness  daily    Duration of Dizziness  seconds    Aggravating Factors  Looking up to the ceiling;Supine to sit    Relieving Factors  Head stationary;Slow movements       Occulomotor Exam   Occulomotor Alignment  Normal    Spontaneous  Absent    Gaze-induced  Absent    Smooth Pursuits  Comment    Comment  dysconjugate movement of eyes      Positional Testing   Dix-Hallpike  Dix-Hallpike Right;Dix-Hallpike Left      Dix-Hallpike Right   Dix-Hallpike Right Duration  10 sec    Dix-Hallpike Right Symptoms  Upbeat, right rotatory nystagmus      Dix-Hallpike Left   Dix-Hallpike Left Duration  0    Dix-Hallpike Left Symptoms  No nystagmus          Objective measurements completed on examination: See above findings.       Vestibular Treatment/Exercise - 06/08/18 2111      Vestibular Treatment/Exercise   Vestibular Treatment Provided  Canalith Repositioning    Canalith Repositioning  Epley Manuever Right       EPLEY MANUEVER RIGHT   Number of Reps   2    Overall Response  Symptoms Resolved    Response Details   +nystagmus each step 1st rep; no nystagmus on 2nd rep            PT Education - 06/08/18 2112    Education Details  what is BPPV? how PT can help; will feel "off" for 1-2 hours after session (husband drove pt)    Person(s) Educated  Patient    Methods  Explanation;Demonstration;Verbal cues;Tactile cues    Comprehension  Verbalized understanding;Returned demonstration;Verbal cues required;Tactile cues required;Need further instruction          PT Long Term Goals - 06/08/18 2129      PT LONG TERM GOAL #1   Title  Patient will have negative positional testing for BPPV ([facility] for all LTGs 07/08/2018)    Time  4    Period  Weeks    Status  New      PT LONG TERM GOAL #2   Title  Patient will be able to verbalize how to obtain a referral for PT if has another episode of BPPV    Time  4  Period  Weeks    Status  New      PT LONG TERM GOAL #3   Title  Assessment for residual vestibular hypofunction completed and recertification completed if indicated    Time  4    Period  Weeks    Status  New              Plan - 06/08/18 2117    Clinical Impression Statement  Patient referred to OPPT with diagnosis of BPPV after having spinning episodes for >2 months. Patient with negative left Hallpike-Dix and positive right Hallpike-Dix followed by rt Epley maneuver x 2. Patient asymptomatic during second rep. Patient will need to be assessed for vestibular hypofunction as her eyes were observed to become dysconjugate during smooth pursuits.     History and Personal Factors relevant to plan of care:  PMH-anxiety, atypical chest pain, brain aneurysm, Crohn's , depression, fibromyalgia, congenital rt ear hearing loss,     Clinical Presentation  Unstable    Clinical Presentation due to:  daily, multiple episodes of spinning putting her at incr fall risk    Clinical Decision Making  Low    Rehab Potential  Good    PT Frequency  1x / week    PT Duration  4 weeks    PT Treatment/Interventions  ADLs/Self Care Home Management;Canalith Repostioning;Neuromuscular re-education;Balance training;Gait training;Therapeutic exercise;Therapeutic activities;Patient/family education;Vestibular;Visual/perceptual remediation/compensation    PT Next Visit Plan  assess for rt posterior BPPV; assess for hypofunction    Consulted and Agree with Plan of Care  Patient       Patient will benefit from skilled therapeutic intervention in order to improve the following deficits and impairments:  Decreased activity tolerance, Decreased mobility, Dizziness, Difficulty walking  Visit Diagnosis: BPPV (benign paroxysmal positional vertigo), right - Plan: PT plan of care cert/re-cert  Difficulty in walking, not elsewhere classified - Plan: PT plan of care cert/re-cert  Dizziness and giddiness - Plan: PT plan of care cert/re-cert     Problem List Patient Active Problem List   Diagnosis Date Noted  . S/P left knee arthroscopy 12/01/17 12/08/2017  . Derangement of posterior horn of medial meniscus of left knee   .  Derangement of anterior horn of lateral meniscus of left knee   . Multiple pulmonary nodules 07/07/2016  . Dyspnea 07/07/2016  . TIA (transient ischemic attack)   . Left sided numbness 11/02/2014  . Tobacco use 11/02/2014  . Bradycardia 11/02/2014  . Brain aneurysm 11/02/2014  . Crohn disease ([place]) 11/02/2014  . Fibromyalgia 11/02/2014  . Left-sided weakness 11/02/2014  . Chronic anxiety 11/02/2014  . CVA (cerebral infarction) 07/10/2013  . Dizziness 05/05/2012  . Anxiety state, unspecified 12/11/2009  . TOBACCO ABUSE 12/11/2009  . Subjective visual disturbance 12/11/2009  . Myalgia and myositis 12/11/2009  . PALPITATIONS 12/11/2009  . Precordial pain 12/11/2009    Rexanne Mano, PT 06/08/2018, 9:35 PM  [facility] [address] [place], [place], [address] Phone: [phone]   Fax:  [phone]  Name: Stacy Hansen MRN: [id number] Date of Birth: July 12, 1951

## 2018-06-15 ENCOUNTER — Ambulatory Visit: Payer: Medicare Other | Admitting: Physical Therapy

## 2018-06-15 ENCOUNTER — Encounter: Payer: Self-pay | Admitting: Physical Therapy

## 2018-06-15 NOTE — Therapy (Unsigned)
[facility] [address] [place], [place], [address] Phone: [phone]   Fax:  [phone]  Patient Details  Name: Stacy Hansen MRN: [id number] Date of Birth: 10-Sep-1951 Referring Provider:  No ref. provider found   Encounter Date: 06/15/2018   PHYSICAL THERAPY DISCHARGE SUMMARY  Visits from Start of Care: 1  Current functional level related to goals / functional outcomes: Pt did not return to clinic, called to cancel remaining appts due to no longer dizzy   Remaining deficits: none   Education / Equipment: NA  Plan: Patient agrees to discharge.  Patient goals were not met. Patient is being discharged due to the patient's request.  ?????        Rexanne Mano, PT 06/15/2018, 11:52 AM  [facility] [address] [place], [place], [address] Phone: [phone]   Fax:  [phone]

## 2018-07-19 NOTE — Progress Notes (Deleted)
NEUROLOGY CONSULTATION NOTE  Stacy Hansen MRN: [id number] DOB: 1951/04/08  Referring provider: Lars Mage, NP Primary care provider: Lars Mage, NP  Reason for consult:  vertigo  HISTORY OF PRESENT ILLNESS: Stacy Hansen is a 67 year old ***-handed female with Crohn's disease, fibromyalgia, depression, anxiety and vascular variant who presents for vertigo.  History supplemented by referring provider's note.  He has history of complicated migraines going back years with multiple imaging and stroke workup.  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  She has been followed by Dr. Luanne Bras.  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 .  She presents with sudden onset visual aura consisting of zigzag lines in her left visual field, sometimes left facial numbness, diffuse weakness or weakness and incoordination of her left arm and hand, nausea and left retro-orbital shooting pain.    In May, she woke up one morning with acute onset of dizziness, described as room spinning to the left when turning to either side and when she sat up and then stood from bed.    It is associated with nausea but no vomiting, double vision, dysphagia, slurred speech or unilateral numbness or weakness.  It lasts *** and occurs ***, often triggered by fast movements of her head.  She also reports ringing in her left ear.  She has tried meclizine 31m ***.  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  PAST MEDICAL HISTORY: Past Medical History:   Diagnosis Date  . Anxiety   . Atypical chest pain   . Brain aneurysm   . Chronic abdominal pain   . Crohn disease ([place]   . Crohn's colitis ([place]   . Depression   . Fibromyalgia   . GERD (gastroesophageal reflux disease)   . Headache   . 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 History of fibromyalgia   . Hyperplastic colon polyp   . Inflammatory bowel disease   . Panic attacks   . Stroke ([place]   . Tobacco abuse     PAST SURGICAL HISTORY: Past Surgical History:  Procedure Laterality Date  . CATARACT EXTRACTION Bilateral   . IR GENERIC HISTORICAL  07/28/2016   IR RADIOLOGIST EVAL & MGMT 07/28/2016 [facility]  . KNEE ARTHROSCOPY WITH MEDIAL MENISECTOMY Left 12/01/2017   Procedure: LEFT KNEE ARTHROSCOPY WITH PARTIAL MEDIAL AND LATERAL MENISECTOMY;  Surgeon: HCarole Civil MD;  Location: [facility];  Service: Orthopedics;  Laterality: Left;  . ORIF ULNAR FRACTURE Left   . ROTATOR CUFF REPAIR Left   . TONSILLECTOMY    . TUBAL LIGATION    . VAGINAL HYSTERECTOMY     has her ovaries  . VARICOSE VEIN SURGERY Right     MEDICATIONS: Current Outpatient Medications on File Prior to Visit  Medication Sig Dispense Refill  . acetaminophen (TYLENOL) 500 MG tablet Take 500 mg by mouth every 6 (six) hours as needed for moderate pain or headache.     . albuterol (PROVENTIL HFA;VENTOLIN HFA) 108 (90 BASE) MCG/ACT inhaler Inhale 2 puffs into the lungs every 6 (six)  hours as needed for wheezing or shortness of breath.    Marland Kitchen aspirin 325 MG tablet Take 1 tablet (325 mg total) by mouth daily.    . carboxymethylcellulose (REFRESH PLUS) 0.5 % SOLN Place 1 drop into both eyes 3 (three) times daily as needed (for dry/itching eyes).     . Cholecalciferol (VITAMIN D3) 1000 units CAPS Take 1,000 Units by mouth.     . cyclobenzaprine (FLEXERIL) 5 MG tablet Take 1 tablet (5 mg total) by mouth 3 (three) times daily as needed for muscle spasms. (Patient not taking: Reported on 06/08/2018) 30 tablet 0    . cycloSPORINE (RESTASIS) 0.05 % ophthalmic emulsion Place 1 drop into both eyes 3 (three) times daily.    Marland Kitchen gabapentin (NEURONTIN) 100 MG capsule Take 1 capsule (100 mg total) by mouth 3 (three) times daily. 90 capsule 2  . guaiFENesin-dextromethorphan (ROBITUSSIN DM) 100-10 MG/5ML syrup Take 5 mLs by mouth every 4 (four) hours as needed for cough.    . hydrOXYzine (ATARAX/VISTARIL) 50 MG tablet     . ibuprofen (ADVIL,MOTRIN) 800 MG tablet Take 1 tablet (800 mg total) by mouth every 8 (eight) hours as needed. (Patient not taking: Reported on 06/08/2018) 90 tablet 1  . LORazepam (ATIVAN) 0.5 MG tablet Take 0.5 mg by mouth at bedtime.     . vitamin B-12 (CYANOCOBALAMIN) 1000 MCG tablet Take 1,000 mcg by mouth daily.    Marland Kitchen zolpidem (AMBIEN) 10 MG tablet Take by mouth.     No current facility-administered medications on file prior to visit.     ALLERGIES: Allergies  Allergen Reactions  . Dexamethasone Other (See Comments)    halluncinations  . Ciprofloxacin Other (See Comments)    Unknown reaction per pt  . Meperidine Hcl Nausea And Vomiting and Other (See Comments)    Causes blood pressure to drop when taken with Phenergan through IV  . Opium Other (See Comments)    Causes blood pressure to drop when taken with Phenergan through IV  . Promethazine Hcl Nausea And Vomiting and Other (See Comments)    Causes blood pressure to drop when taken with Demeral via IV  . Propoxyphene N-Acetaminophen Other (See Comments)    "pass out"    FAMILY HISTORY: Family History  Problem Relation Age of Onset  . Diabetes Mother   . Kidney failure Mother   . Stroke Father   . Colon cancer Brother        in his 29's  . Diabetes Brother   . Diabetes Sister        x 2   SOCIAL HISTORY: Social History   Socioeconomic History  . Marital status: Married    Spouse name: Not on file  . Number of children: 2  . Years of education: Not on file  . Highest education level: Not on file  Occupational  History  . Occupation: Systems developer: [employer]  Social Needs  . Financial resource strain: Not on file  . Food insecurity:    Worry: Not on file    Inability: Not on file  . Transportation needs:    Medical: Not on file    Non-medical: Not on file  Tobacco Use  . Smoking status: Current Every Day Smoker    Packs/day: 0.25    Years: 20.00    Pack years: 5.00    Types: Cigarettes  . Smokeless tobacco: Never Used  . Tobacco comment: prev. smoking hx states one pack would last 4  days.  started smoking back 12/18/17 and smokes 2 cigs per day  Substance and Sexual Activity  . Alcohol use: No    Alcohol/week: 0.0 standard drinks  . Drug use: No  . Sexual activity: Yes    Birth control/protection: None  Lifestyle  . Physical activity:    Days per week: Not on file    Minutes per session: Not on file  . Stress: Not on file  Relationships  . Social connections:    Talks on phone: Not on file    Gets together: Not on file    Attends religious service: Not on file    Active member of club or organization: Not on file    Attends meetings of clubs or organizations: Not on file    Relationship status: Not on file  . Intimate partner violence:    Fear of current or ex partner: Not on file    Emotionally abused: Not on file    Physically abused: Not on file    Forced sexual activity: Not on file  Other Topics Concern  . Not on file  Social History Narrative  . Not on file    REVIEW OF SYSTEMS: Constitutional: No fevers, chills, or sweats, no generalized fatigue, change in appetite Eyes: No visual changes, double vision, eye pain Ear, nose and throat: No hearing loss, ear pain, nasal congestion, sore throat Cardiovascular: No chest pain, palpitations Respiratory:  No shortness of breath at rest or with exertion, wheezes GastrointestinaI: No nausea, vomiting, diarrhea, abdominal pain, fecal incontinence Genitourinary:  No dysuria, urinary retention or  frequency Musculoskeletal:  No neck pain, back pain Integumentary: No rash, pruritus, skin lesions Neurological: as above Psychiatric: No depression, insomnia, anxiety Endocrine: No palpitations, fatigue, diaphoresis, mood swings, change in appetite, change in weight, increased thirst Hematologic/Lymphatic:  No purpura, petechiae. Allergic/Immunologic: no itchy/runny eyes, nasal congestion, recent allergic reactions, rashes  PHYSICAL EXAM: *** General: No acute distress.  Patient appears ***-groomed.  *** Head:  Normocephalic/atraumatic Eyes:  fundi examined but not visualized Neck: supple, no paraspinal tenderness, full range of motion Back: No paraspinal tenderness Heart: regular rate and rhythm Lungs: Clear to auscultation bilaterally. Vascular: No carotid bruits. Neurological Exam: Mental status: alert and oriented to person, place, and time, recent and remote memory intact, fund of knowledge intact, attention and concentration intact, speech fluent and not dysarthric, language intact. Cranial nerves: CN I: not tested CN II: pupils equal, round and reactive to light, visual fields intact CN III, IV, VI:  full range of motion, no nystagmus, no ptosis CN V: facial sensation intact CN VII: upper and lower face symmetric CN VIII: hearing intact CN IX, X: gag intact, uvula midline CN XI: sternocleidomastoid and trapezius muscles intact CN XII: tongue midline Bulk & Tone: normal, no fasciculations. Motor:  5/5 throughout *** Sensation:  [place] *** temperature *** and vibration sensation intact.  ***. Deep Tendon Reflexes:  2+ throughout, *** toes downgoing.  *** Finger to nose testing:  Without dysmetria.  *** Heel to shin:  Without dysmetria.  *** Gait:  Normal station and stride.  Able to turn and tandem walk. [place] ***.  IMPRESSION: ***  PLAN: ***  Thank you for allowing me to take part in the care of this patient.  Metta Clines, DO  CC: Lars Mage,  NP

## 2018-07-20 ENCOUNTER — Ambulatory Visit: Payer: BLUE CROSS/BLUE SHIELD | Admitting: Neurology

## 2018-10-27 ENCOUNTER — Emergency Department (HOSPITAL_COMMUNITY)
Admission: EM | Admit: 2018-10-27 | Discharge: 2018-10-27 | Disposition: A | Discharge status: Home / Self Care | Payer: Medicare Other | Attending: Emergency Medicine | Admitting: Emergency Medicine

## 2018-10-27 ENCOUNTER — Encounter (HOSPITAL_COMMUNITY): Payer: Self-pay | Admitting: Emergency Medicine

## 2018-10-27 ENCOUNTER — Emergency Department (HOSPITAL_COMMUNITY): Payer: Medicare Other

## 2018-10-27 ENCOUNTER — Other Ambulatory Visit: Payer: Self-pay

## 2018-10-27 DIAGNOSIS — R1031 Right lower quadrant pain: Secondary | ICD-10-CM | POA: Insufficient documentation

## 2018-10-27 DIAGNOSIS — F17210 Nicotine dependence, cigarettes, uncomplicated: Secondary | ICD-10-CM | POA: Diagnosis not present

## 2018-10-27 DIAGNOSIS — F329 Major depressive disorder, single episode, unspecified: Secondary | ICD-10-CM | POA: Insufficient documentation

## 2018-10-27 DIAGNOSIS — Z7982 Long term (current) use of aspirin: Secondary | ICD-10-CM | POA: Insufficient documentation

## 2018-10-27 DIAGNOSIS — Z79899 Other long term (current) drug therapy: Secondary | ICD-10-CM | POA: Diagnosis not present

## 2018-10-27 DIAGNOSIS — Z8673 Personal history of transient ischemic attack (TIA), and cerebral infarction without residual deficits: Secondary | ICD-10-CM | POA: Diagnosis not present

## 2018-10-27 DIAGNOSIS — F419 Anxiety disorder, unspecified: Secondary | ICD-10-CM | POA: Diagnosis not present

## 2018-10-27 LAB — COMPREHENSIVE METABOLIC PANEL
ALT: 12 U/L (ref 0–44)
AST: 20 U/L (ref 15–41)
Albumin: 4.3 g/dL (ref 3.5–5.0)
Alkaline Phosphatase: 62 U/L (ref 38–126)
Anion gap: 7 (ref 5–15)
BILIRUBIN TOTAL: 0.6 mg/dL (ref 0.3–1.2)
BUN: 16 mg/dL (ref 8–23)
CO2: 27 mmol/L (ref 22–32)
CREATININE: 0.97 mg/dL (ref 0.44–1.00)
Calcium: 9.5 mg/dL (ref 8.9–10.3)
Chloride: 106 mmol/L (ref 98–111)
GFR calc Af Amer: >60 mL/min (ref >60)
Glucose, Bld: 112 mg/dL — ABNORMAL HIGH (ref 70–99)
POTASSIUM: 4 mmol/L (ref 3.5–5.1)
Sodium: 140 mmol/L (ref 135–145)
TOTAL PROTEIN: 7.2 g/dL (ref 6.5–8.1)

## 2018-10-27 LAB — LIPASE, BLOOD: Lipase: 45 U/L (ref 11–51)

## 2018-10-27 LAB — URINALYSIS, ROUTINE W REFLEX MICROSCOPIC
GLUCOSE, UA: NEGATIVE mg/dL
Hgb urine dipstick: NEGATIVE
Ketones, ur: 5 mg/dL — AB
Leukocytes, UA: NEGATIVE
Nitrite: NEGATIVE
Protein, ur: NEGATIVE mg/dL
SPECIFIC GRAVITY, URINE: 1.031 — AB (ref 1.005–1.030)
pH: 5 (ref 5.0–8.0)

## 2018-10-27 LAB — CBC
HCT: 43.3 % (ref 36.0–46.0)
Hemoglobin: 13.5 g/dL (ref 12.0–15.0)
MCH: 29.9 pg (ref 26.0–34.0)
MCHC: 31.2 g/dL (ref 30.0–36.0)
MCV: 95.8 fL (ref 80.0–100.0)
Platelets: 245 10*3/uL (ref 150–400)
RBC: 4.52 MIL/uL (ref 3.87–5.11)
RDW: 13.8 % (ref 11.5–15.5)
WBC: 8.7 10*3/uL (ref 4.0–10.5)
nRBC: 0 % (ref 0.0–0.2)

## 2018-10-27 MED ORDER — ONDANSETRON HCL 4 MG/2ML IJ SOLN
4.0000 mg | Freq: Once | INTRAMUSCULAR | Status: AC
  Administered 2018-10-27: 4 mg via INTRAVENOUS
  Filled 2018-10-27: qty 2

## 2018-10-27 MED ORDER — FENTANYL CITRATE (PF) 100 MCG/2ML IJ SOLN: 25.0000 ug | Freq: Once | INTRAMUSCULAR | Status: DC

## 2018-10-27 MED ORDER — FENTANYL CITRATE (PF) 100 MCG/2ML IJ SOLN
25.0000 ug | Freq: Once | INTRAMUSCULAR | Status: AC
  Administered 2018-10-27: 25 ug via INTRAVENOUS
  Filled 2018-10-27: qty 2

## 2018-10-27 MED ORDER — TRAMADOL HCL 50 MG PO TABS: 50.0000 mg | ORAL_TABLET | Freq: Four times a day (QID) | ORAL | 0 refills | Status: DC | PRN

## 2018-10-27 MED ORDER — ONDANSETRON HCL 4 MG PO TABS: 4.0000 mg | ORAL_TABLET | Freq: Four times a day (QID) | ORAL | 0 refills | Status: DC

## 2018-10-27 MED ORDER — IOPAMIDOL (ISOVUE-300) INJECTION 61%
100.0000 mL | Freq: Once | INTRAVENOUS | Status: AC | PRN
  Administered 2018-10-27: 100 mL via INTRAVENOUS

## 2018-10-27 NOTE — ED Triage Notes (Signed)
Pt c/o of right lower abdominal pain with nausea since Monday.  Denies any new injury or diarrhea

## 2018-10-27 NOTE — ED Notes (Signed)
Pt ambulated to bathroom. No assistance needed. Pt stated she did not feel dizzy.

## 2018-10-27 NOTE — Discharge Instructions (Addendum)
Call your primary care provider to arrange a follow-up appointment.  Return to the ER for any worsening symptoms such as fever, increasing pain or vomiting.

## 2018-10-28 NOTE — ED Provider Notes (Signed)
[facility] EMERGENCY DEPARTMENT Provider Note   CSN: [id number] Arrival date & time: 10/27/18  2458     History   Chief Complaint Chief Complaint  Patient presents with  . Abdominal Pain    HPI Stacy Hansen is a 67 y.o. female.  HPI   Stacy Hansen is a 67 y.o. female who presents to the Emergency Department complaining of right lower abdominal pain.  She was seen by her PCP earlier this week and had an [place] of her abdomen one day prior to arrival here.  She was advised her pain may be related to her appendix and she was advised to come to ER for further evaluation.  She describes a sharp pain to minimal touch of the right lower abdomen.  Mild nausea, but no vomiting.  Pain is associated with movement.  Denies decreased appetite.  Partial hysterectomy is only  previous abdominal surgeries.  Denies fever, diarrhea, flank pain and dysuria.   Past Medical History:  Diagnosis Date  . Anxiety   . Atypical chest pain   . Brain aneurysm   . Chronic abdominal pain   . Crohn disease ([place])   . Crohn's colitis ([place])   . Depression   . Fibromyalgia   . GERD (gastroesophageal reflux disease)   . Headache   . 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 History of fibromyalgia   . Hyperplastic colon polyp   . Inflammatory bowel disease   . Panic attacks   . Stroke ([place])   . Tobacco abuse     Patient Active Problem List   Diagnosis Date Noted  . S/P left knee arthroscopy 12/01/17 12/08/2017  . Derangement of posterior horn of medial meniscus of left knee   . Derangement of anterior horn of lateral meniscus of left knee   . Multiple pulmonary nodules 07/07/2016  . Dyspnea 07/07/2016  . TIA (transient ischemic attack)   . Left sided numbness 11/02/2014  . Tobacco use 11/02/2014  . Bradycardia 11/02/2014  . Brain aneurysm 11/02/2014  . Crohn disease ([place]) 11/02/2014  . Fibromyalgia 11/02/2014  . Left-sided weakness 11/02/2014  . Chronic anxiety 11/02/2014  . CVA  (cerebral infarction) 07/10/2013  . Dizziness 05/05/2012  . Anxiety state, unspecified 12/11/2009  . TOBACCO ABUSE 12/11/2009  . Subjective visual disturbance 12/11/2009  . Myalgia and myositis 12/11/2009  . PALPITATIONS 12/11/2009  . Precordial pain 12/11/2009    Past Surgical History:  Procedure Laterality Date  . CATARACT EXTRACTION Bilateral   . IR GENERIC HISTORICAL  07/28/2016   IR RADIOLOGIST EVAL & MGMT 07/28/2016 [facility]INTER[facility] RAD  . KNEE ARTHROSCOPY WITH MEDIAL MENISECTOMY Left 12/01/2017   Procedure: LEFT KNEE ARTHROSCOPY WITH PARTIAL MEDIAL AND LATERAL MENISECTOMY;  Surgeon: Carole Civil, MD;  Location: [facility];  Service: Orthopedics;  Laterality: Left;  . ORIF ULNAR FRACTURE Left   . ROTATOR CUFF REPAIR Left   . TONSILLECTOMY    . TUBAL LIGATION    . VAGINAL HYSTERECTOMY     has her ovaries  . VARICOSE VEIN SURGERY Right      OB History   No obstetric history on file.      Home Medications    Prior to Admission medications   Medication Sig Start Date End Date Taking? Authorizing Provider  acetaminophen (TYLENOL) 500 MG tablet Take 500 mg by mouth every 6 (six) hours as needed for moderate pain or headache.    Yes [provider]  albuterol (PROVENTIL HFA;VENTOLIN  HFA) 108 (90 BASE) MCG/ACT inhaler Inhale 2 puffs into the lungs every 6 (six) hours as needed for wheezing or shortness of breath.   Yes [provider]  aspirin 325 MG tablet Take 1 tablet (325 mg total) by mouth daily. 11/03/14  Yes Samuella Cota, MD  carboxymethylcellulose (REFRESH PLUS) 0.5 % SOLN Place 1 drop into both eyes 3 (three) times daily as needed (for dry/itching eyes).    Yes [provider]  Cholecalciferol (VITAMIN D3) 1000 units CAPS Take 1,000 Units by mouth.    Yes [provider]  cycloSPORINE (RESTASIS) 0.05 % ophthalmic emulsion Place 1 drop into both eyes 3 (three) times daily.   Yes [provider]  guaiFENesin-dextromethorphan  (ROBITUSSIN DM) 100-10 MG/5ML syrup Take 5 mLs by mouth every 4 (four) hours as needed for cough.   Yes [provider]  LORazepam (ATIVAN) 0.5 MG tablet Take 0.5 mg by mouth at bedtime.    Yes [provider]  vitamin B-12 (CYANOCOBALAMIN) 1000 MCG tablet Take 1,000 mcg by mouth daily.   Yes [provider]  gabapentin (NEURONTIN) 100 MG capsule Take 1 capsule (100 mg total) by mouth 3 (three) times daily. Patient not taking: Reported on 10/27/2018 01/20/18   Carole Civil, MD  ondansetron (ZOFRAN) 4 MG tablet Take 1 tablet (4 mg total) by mouth every 6 (six) hours. 10/27/18   Trenten Watchman, PA-C  traMADol (ULTRAM) 50 MG tablet Take 1 tablet (50 mg total) by mouth every 6 (six) hours as needed. 10/27/18   Tyneisha Hegeman, PA-C  zolpidem (AMBIEN) 10 MG tablet Take by mouth. 12/20/17 01/19/18  [provider]    Family History Family History  Problem Relation Age of Onset  . Diabetes Mother   . Kidney failure Mother   . Stroke Father   . Colon cancer Brother        in his 38's  . Diabetes Brother   . Diabetes Sister        x 2    Social History Social History   Tobacco Use  . Smoking status: Current Every Day Smoker    Packs/day: 0.25    Years: 20.00    Pack years: 5.00    Types: Cigarettes  . Smokeless tobacco: Never Used  . Tobacco comment: prev. smoking hx states one pack would last 4 days.  started smoking back 12/18/17 and smokes 2 cigs per day  Substance Use Topics  . Alcohol use: No    Alcohol/week: 0.0 standard drinks  . Drug use: No     Allergies   Dexamethasone; Ciprofloxacin; Meperidine hcl; Opium; Promethazine hcl; and Propoxyphene n-acetaminophen   Review of Systems Review of Systems  Constitutional: Negative for appetite change, chills and fever.  Respiratory: Negative for shortness of breath.   Cardiovascular: Negative for chest pain.  Gastrointestinal: Positive for abdominal pain and nausea. Negative for blood in  stool, diarrhea and vomiting.  Genitourinary: Negative for decreased urine volume, difficulty urinating, dysuria, flank pain and vaginal bleeding.  Musculoskeletal: Negative for back pain.  Skin: Negative for color change and rash.  Neurological: Negative for dizziness, weakness and numbness.  Hematological: Negative for adenopathy.     Physical Exam Updated Vital Signs BP (!) 102/52 (BP Location: Left Arm)   Pulse 61   Temp 98 F (36.7 C) (Oral)   Resp 20   Ht 5' 3"  (1.6 m)   Wt 54.4 kg   SpO2 100%   BMI 21.26 kg/m  Physical Exam Vitals signs and nursing note reviewed.  Constitutional:      General: She is not in acute distress.    Appearance: She is well-developed.  HENT:     Head: Atraumatic.     Mouth/Throat:     Mouth: Mucous membranes are moist.     Pharynx: Oropharynx is clear.  Cardiovascular:     Rate and Rhythm: Normal rate and regular rhythm.     Heart sounds: Normal heart sounds. No murmur.  Pulmonary:     Effort: Pulmonary effort is normal. No respiratory distress.     Breath sounds: Normal breath sounds.  Abdominal:     General: Bowel sounds are normal. There is no distension.     Palpations: Abdomen is soft. There is no mass.     Tenderness: There is abdominal tenderness in the right lower quadrant. There is no guarding or rebound. Negative signs include McBurney's sign.  Musculoskeletal: Normal range of motion.  Skin:    General: Skin is warm and dry.  Neurological:     General: No focal deficit present.     Mental Status: She is alert and oriented to person, place, and time.     Motor: No abnormal muscle tone.     Coordination: Coordination normal.  Psychiatric:        Mood and Affect: Mood normal.      ED Treatments / Results  Labs (all labs ordered are listed, but only abnormal results are displayed) Labs Reviewed  COMPREHENSIVE METABOLIC PANEL - Abnormal; Notable for the following components:      Result Value   Glucose, Bld 112 (*)      All other components within normal limits  URINALYSIS, ROUTINE W REFLEX MICROSCOPIC - Abnormal; Notable for the following components:   Color, Urine AMBER (*)    APPearance HAZY (*)    Specific Gravity, Urine 1.031 (*)    Bilirubin Urine SMALL (*)    Ketones, ur 5 (*)    All other components within normal limits  LIPASE, BLOOD  CBC    EKG None  Radiology Ct Abdomen Pelvis W Contrast  Result Date: 10/27/2018 CLINICAL DATA:  Previous partial hysterectomy and tubal ligation. Current right lower quadrant pain with nausea 5 days. EXAM: CT ABDOMEN AND PELVIS WITH CONTRAST TECHNIQUE: Multidetector CT imaging of the abdomen and pelvis was performed using the standard protocol following bolus administration of intravenous contrast. CONTRAST:  117m ISOVUE-300 IOPAMIDOL (ISOVUE-300) INJECTION 61% COMPARISON:  07/16/2015 FINDINGS: Lower chest: Lung bases are normal.  Possible small hiatal hernia. Hepatobiliary: Liver, gallbladder and biliary tree are normal. Pancreas: Normal. Spleen: Normal. Adrenals/Urinary Tract: Adrenal glands are normal. Kidneys are normal in size without hydronephrosis or nephrolithiasis. Ureters and bladder are normal. Stomach/Bowel: Possible small hiatal hernia. Stomach is otherwise unremarkable. Small bowel is normal. Short appendix is unchanged and within normal.: Is normal. Vascular/Lymphatic: Minimal calcified plaque over the abdominal aorta which is normal in caliber. No adenopathy. Reproductive: Previous hysterectomy. Adnexal regions are within normal. Evidence of previous tubal ligation. Other: No free fluid or focal inflammatory change. Musculoskeletal: Mild degenerate change of the spine. IMPRESSION: No acute findings in the abdomen/pelvis. Possible small hiatal hernia unchanged. Aortic Atherosclerosis (ICD10-I70.0). Electronically Signed   By: DMarin OlpM.D.   On: 10/27/2018 11:10    Procedures Procedures (including critical care time)  Medications Ordered in  ED Medications  fentaNYL (SUBLIMAZE) injection 25 mcg (25 mcg Intravenous Given 10/27/18 1012)  ondansetron (ZOFRAN) injection 4 mg (4 mg  Intravenous Given 10/27/18 1012)  iopamidol (ISOVUE-300) 61 % injection 100 mL (100 mLs Intravenous Contrast Given 10/27/18 1053)     Initial Impression / Assessment and Plan / ED Course  I have reviewed the triage vital signs and the nursing notes.  Pertinent labs & imaging results that were available during my care of the patient were reviewed by me and considered in my medical decision making (see chart for details).     Pt with RLQ pain, source unclear.  Workup reassuring.  Non-toxic appearing. No concerning sx;'s for acute surgical abdomen.  Pt feeling better.  She agrees to close out pt f/u and strict return precautions discussed.      Final Clinical Impressions(s) / ED Diagnoses   Final diagnoses:  Right lower quadrant abdominal pain    ED Discharge Orders         Ordered    traMADol (ULTRAM) 50 MG tablet  Every 6 hours PRN     10/27/18 1249    ondansetron (ZOFRAN) 4 MG tablet  Every 6 hours     10/27/18 1249           Kem Parkinson, PA-C 10/28/18 2334    Fredia Sorrow, MD 11/01/18 1054

## 2019-04-05 ENCOUNTER — Other Ambulatory Visit (HOSPITAL_COMMUNITY): Payer: Self-pay | Admitting: Adult Health Nurse Practitioner

## 2019-04-05 DIAGNOSIS — G4762 Sleep related leg cramps: Secondary | ICD-10-CM | POA: Insufficient documentation

## 2019-04-05 DIAGNOSIS — Z780 Asymptomatic menopausal state: Secondary | ICD-10-CM

## 2019-04-05 DIAGNOSIS — Z1231 Encounter for screening mammogram for malignant neoplasm of breast: Secondary | ICD-10-CM

## 2019-04-26 ENCOUNTER — Ambulatory Visit (HOSPITAL_COMMUNITY)
Admission: RE | Admit: 2019-04-26 | Discharge: 2019-04-26 | Disposition: A | Discharge status: Home / Self Care | Payer: Medicare Other | Source: Ambulatory Visit | Attending: Adult Health Nurse Practitioner | Admitting: Adult Health Nurse Practitioner

## 2019-04-26 ENCOUNTER — Ambulatory Visit (HOSPITAL_COMMUNITY): Payer: Medicare Other

## 2019-04-26 ENCOUNTER — Other Ambulatory Visit: Payer: Self-pay

## 2019-04-26 DIAGNOSIS — Z780 Asymptomatic menopausal state: Secondary | ICD-10-CM | POA: Insufficient documentation

## 2019-04-26 DIAGNOSIS — Z1231 Encounter for screening mammogram for malignant neoplasm of breast: Secondary | ICD-10-CM | POA: Diagnosis present

## 2019-05-10 ENCOUNTER — Ambulatory Visit: Payer: Medicare Other | Admitting: Family Medicine

## 2019-05-24 ENCOUNTER — Other Ambulatory Visit: Payer: Self-pay

## 2019-05-25 ENCOUNTER — Ambulatory Visit (INDEPENDENT_AMBULATORY_CARE_PROVIDER_SITE_OTHER): Payer: Medicare Other | Admitting: Family Medicine

## 2019-05-25 ENCOUNTER — Encounter: Payer: Self-pay | Admitting: Family Medicine

## 2019-05-25 ENCOUNTER — Ambulatory Visit: Payer: Medicare Other | Admitting: Family Medicine

## 2019-05-25 VITALS — BP 109/67 | HR 71 | Temp 97.2°F | Ht 63.0 in | Wt 117.2 lb

## 2019-05-25 DIAGNOSIS — Z7689 Persons encountering health services in other specified circumstances: Secondary | ICD-10-CM

## 2019-05-25 DIAGNOSIS — K50919 Crohn's disease, unspecified, with unspecified complications: Secondary | ICD-10-CM

## 2019-05-25 DIAGNOSIS — I671 Cerebral aneurysm, nonruptured: Secondary | ICD-10-CM

## 2019-05-25 DIAGNOSIS — Z9181 History of falling: Secondary | ICD-10-CM

## 2019-05-25 DIAGNOSIS — F411 Generalized anxiety disorder: Secondary | ICD-10-CM

## 2019-05-25 DIAGNOSIS — G4700 Insomnia, unspecified: Secondary | ICD-10-CM

## 2019-05-25 DIAGNOSIS — I8390 Asymptomatic varicose veins of unspecified lower extremity: Secondary | ICD-10-CM

## 2019-05-25 DIAGNOSIS — K219 Gastro-esophageal reflux disease without esophagitis: Secondary | ICD-10-CM

## 2019-05-25 DIAGNOSIS — H04123 Dry eye syndrome of bilateral lacrimal glands: Secondary | ICD-10-CM

## 2019-05-25 DIAGNOSIS — M25522 Pain in left elbow: Secondary | ICD-10-CM

## 2019-05-25 DIAGNOSIS — Z720 Tobacco use: Secondary | ICD-10-CM

## 2019-05-25 MED ORDER — TRAZODONE HCL 50 MG PO TABS: 50.0000 mg | ORAL_TABLET | Freq: Every evening | ORAL | 1 refills | Status: DC | PRN

## 2019-05-25 NOTE — Patient Instructions (Signed)
Preventive Care 68 Years and Older, Female Preventive care refers to lifestyle choices and visits with your health care provider that can promote health and wellness. This includes:  A yearly physical exam. This is also called an annual well check.  Regular dental and eye exams.  Immunizations.  Screening for certain conditions.  Healthy lifestyle choices, such as diet and exercise. What can I expect for my preventive care visit? Physical exam Your health care provider will check:  Height and weight. These may be used to calculate body mass index (BMI), which is a measurement that tells if you are at a healthy weight.  Heart rate and blood pressure.  Your skin for abnormal spots. Counseling Your health care provider may ask you questions about:  Alcohol, tobacco, and drug use.  Emotional well-being.  Home and relationship well-being.  Sexual activity.  Eating habits.  History of falls.  Memory and ability to understand (cognition).  Work and work Statistician.  Pregnancy and menstrual history. What immunizations do I need?  Influenza (flu) vaccine  This is recommended every year. Tetanus, diphtheria, and pertussis (Tdap) vaccine  You may need a Td booster every 10 years. Varicella (chickenpox) vaccine  You may need this vaccine if you have not already been vaccinated. Zoster (shingles) vaccine  You may need this after age 33. Pneumococcal conjugate (PCV13) vaccine  One dose is recommended after age 33. Pneumococcal polysaccharide (PPSV23) vaccine  One dose is recommended after age 72. Measles, mumps, and rubella (MMR) vaccine  You may need at least one dose of MMR if you were born in 1957 or later. You may also need a second dose. Meningococcal conjugate (MenACWY) vaccine  You may need this if you have certain conditions. Hepatitis A vaccine  You may need this if you have certain conditions or if you travel or work in places where you may be exposed  to hepatitis A. Hepatitis B vaccine  You may need this if you have certain conditions or if you travel or work in places where you may be exposed to hepatitis B. Haemophilus influenzae type b (Hib) vaccine  You may need this if you have certain conditions. You may receive vaccines as individual doses or as more than one vaccine together in one shot (combination vaccines). Talk with your health care provider about the risks and benefits of combination vaccines. What tests do I need? Blood tests  Lipid and cholesterol levels. These may be checked every 5 years, or more frequently depending on your overall health.  Hepatitis C test.  Hepatitis B test. Screening  Lung cancer screening. You may have this screening every year starting at age 39 if you have a 30-pack-year history of smoking and currently smoke or have quit within the past 15 years.  Colorectal cancer screening. All adults should have this screening starting at age 36 and continuing until age 15. Your health care provider may recommend screening at age 23 if you are at increased risk. You will have tests every 1-10 years, depending on your results and the type of screening test.  Diabetes screening. This is done by checking your blood sugar (glucose) after you have not eaten for a while (fasting). You may have this done every 1-3 years.  Mammogram. This may be done every 1-2 years. Talk with your health care provider about how often you should have regular mammograms.  BRCA-related cancer screening. This may be done if you have a family history of breast, ovarian, tubal, or peritoneal cancers.  Other tests  Sexually transmitted disease (STD) testing.  Bone density scan. This is done to screen for osteoporosis. You may have this done starting at age 76. Follow these instructions at home: Eating and drinking  Eat a diet that includes fresh fruits and vegetables, whole grains, lean protein, and low-fat dairy products. Limit  your intake of foods with high amounts of sugar, saturated fats, and salt.  Take vitamin and mineral supplements as recommended by your health care provider.  Do not drink alcohol if your health care provider tells you not to drink.  If you drink alcohol: ? Limit how much you have to 0-1 drink a day. ? Be aware of how much alcohol is in your drink. In the [place] one drink equals one 12 oz bottle of beer (355 mL), one 5 oz glass of wine (148 mL), or one 1 oz glass of hard liquor (44 mL). Lifestyle  Take daily care of your teeth and gums.  Stay active. Exercise for at least 30 minutes on 5 or more days each week.  Do not use any products that contain nicotine or tobacco, such as cigarettes, e-cigarettes, and chewing tobacco. If you need help quitting, ask your health care provider.  If you are sexually active, practice safe sex. Use a condom or other form of protection in order to prevent STIs (sexually transmitted infections).  Talk with your health care provider about taking a low-dose aspirin or statin. What's next?  Go to your health care provider once a year for a well check visit.  Ask your health care provider how often you should have your eyes and teeth checked.  Stay up to date on all vaccines. This information is not intended to replace advice given to you by your health care provider. Make sure you discuss any questions you have with your health care provider. Document Released: 11/28/2015 Document Revised: 10/26/2018 Document Reviewed: 10/26/2018 Elsevier Patient Education  2020 [employer].

## 2019-05-25 NOTE — Progress Notes (Signed)
Subjective:  Patient ID: Stacy Hansen, female    DOB: 1951-04-24  Age: 68 y.o. MRN: [id number]  Patient Care Team: Loman Brooklyn, FNP as PCP - General (Family Medicine) Mauri Pole, MD as Consulting Physician (Gastroenterology) Luanne Bras, MD as Consulting Physician (Interventional Radiology) [facility])   CC:  Chief Complaint  Patient presents with  . New Patient (Initial Visit)    est care- [place]   . Elbow Pain    Left. Patient states that she hit her right elbow x 2 weeks ago and now a knot has come up.    HPI Stacy Hansen presents to establish care.   Occupation: PCA at [employer], Marital status: Married, Substance use: None Exercise: walks 3x/week Last eye exam: due now; she is going to schedule appointment after her upcoming vacation Last dental exam: dentures so she does not go to the dentist Last colonoscopy: UTD Last mammogram: UTD Last pap smear: hysterectomy DEXA: UTD Hepatitis C Screening: needs to be completed; will obtain when labs are next drawn Tdap Vaccine: UTD  Zoster Vaccine: unknown  Pneumonia Vaccine: UTD   Allergies: Patient uses Albuterol use during allergy season for SOB and tightness that occurs with her allergies.   Insomnia: Patient has been taking Ativan for "a while" because she can't sleep at night due to her husband wearing a CPAP. She works second shift as well. She has tried OTC sleep aids and Ambien which were not effective for her. She is agreeable to trying alternatives to the Ativan.   Anxiety/Depression: She does not take anything for anxiety or depression although she reports she has both. She is also interested in something for long term management of this.   Depression screen [facility] 2/9 05/25/2019 05/25/2019  Decreased Interest 0 0  Down, Depressed, Hopeless 1 0  PHQ - 2 Score 1 0   GAD 7 : Generalized Anxiety Score 05/25/2019  Nervous, Anxious, on Edge 1  Control/stop worrying 3  Worry too  much - different things 3  Trouble relaxing 3  Restless 1  Easily annoyed or irritable 3  Afraid - awful might happen 1  Total GAD 7 Score 15  Anxiety Difficulty Somewhat difficult   Left Elbow Pain: Patient c/o left elbow pain after a fall on 04/18/2019. She describes the pain as a throbbing that worsens the more she uses it. Rates pain 7-8/10 throughout the day. Use makes it worse. Stabilizing the elbow makes it better. Tylenol is somewhat effective in relieving the pain. She has not had an x-ray of the elbow completed.   Review of Systems  Constitutional: Negative for chills, fever, malaise/fatigue and weight loss.  HENT: Negative for congestion, ear discharge, ear pain, nosebleeds, sinus pain, sore throat and tinnitus.   Eyes: Negative for blurred vision, double vision, pain, discharge and redness.  Respiratory: Negative for cough, shortness of breath and wheezing.   Cardiovascular: Negative for chest pain, palpitations and leg swelling.  Gastrointestinal: Negative for abdominal pain, constipation, diarrhea, heartburn, nausea and vomiting.  Genitourinary: Negative for dysuria, frequency and urgency.  Musculoskeletal: Positive for joint pain (left elbow). Negative for myalgias.  Skin: Negative for rash.  Neurological: Negative for dizziness, seizures, weakness and headaches.  Psychiatric/Behavioral: Positive for depression. Negative for substance abuse and suicidal ideas. The patient is nervous/anxious and has insomnia.     Past Medical History:  Diagnosis Date  . Anxiety   . Brain aneurysm   . Crohn disease ([place])   . Depression   .  Fibromyalgia   . GERD (gastroesophageal reflux disease)   . Headache   . 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 Hyperplastic colon polyp   . Panic attacks   . Stroke ([place])   . TIA (transient ischemic attack)   . Tobacco abuse     Past Surgical History:  Procedure Laterality Date  . CATARACT EXTRACTION Bilateral   . IR GENERIC HISTORICAL   07/28/2016   IR RADIOLOGIST EVAL & MGMT 07/28/2016 [facility]-INTERV RAD  . KNEE ARTHROSCOPY WITH MEDIAL MENISECTOMY Left 12/01/2017   Procedure: LEFT KNEE ARTHROSCOPY WITH PARTIAL MEDIAL AND LATERAL MENISECTOMY;  Surgeon: Carole Civil, MD;  Location: [facility];  Service: Orthopedics;  Laterality: Left;  . ORIF ULNAR FRACTURE Left   . ROTATOR CUFF REPAIR Left   . TONSILLECTOMY    . TUBAL LIGATION    . VAGINAL HYSTERECTOMY     has her ovaries  . VARICOSE VEIN SURGERY Right     Family History  Problem Relation Age of Onset  . Diabetes Mother   . Kidney failure Mother   . Stroke Father   . Colon cancer Brother        in his 17's  . Diabetes Brother   . Diabetes Sister   . Stroke Paternal Grandfather   . Other Sister        perforated bowel with colostomy  . Cancer Brother        Eye    Social History   Socioeconomic History  . Marital status: Married    Spouse name: Not on file  . Number of children: 2  . Years of education: Not on file  . Highest education level: Not on file  Occupational History  . Occupation: Systems developer: [employer]  Social Needs  . Financial resource strain: Not on file  . Food insecurity    Worry: Not on file    Inability: Not on file  . Transportation needs    Medical: Not on file    Non-medical: Not on file  Tobacco Use  . Smoking status: Current Every Day Smoker    Packs/day: 0.50    Years: 20.00    Pack years: 10.00    Types: Cigarettes  . Smokeless tobacco: Never Used  Substance and Sexual Activity  . Alcohol use: No    Alcohol/week: 0.0 standard drinks  . Drug use: No  . Sexual activity: Yes    Birth control/protection: None  Lifestyle  . Physical activity    Days per week: Not on file    Minutes per session: Not on file  . Stress: Not on file  Relationships  . Social Herbalist on phone: Not on file    Gets together: Not on file    Attends religious service: Not on file    Active member of club  or organization: Not on file    Attends meetings of clubs or organizations: Not on file    Relationship status: Not on file  . Intimate partner violence    Fear of current or ex partner: Not on file    Emotionally abused: Not on file    Physically abused: Not on file    Forced sexual activity: Not on file  Other Topics Concern  . Not on file  Social History Narrative  . Not on file    Current Outpatient Medications:  .  acetaminophen (TYLENOL) 500 MG tablet, Take 500 mg  by mouth every 6 (six) hours as needed for moderate pain or headache. , Disp: , Rfl:  .  albuterol (PROVENTIL HFA;VENTOLIN HFA) 108 (90 BASE) MCG/ACT inhaler, Inhale 2 puffs into the lungs every 6 (six) hours as needed for wheezing or shortness of breath., Disp: , Rfl:  .  aspirin 325 MG tablet, Take 1 tablet (325 mg total) by mouth daily., Disp: , Rfl:  .  carboxymethylcellulose (REFRESH PLUS) 0.5 % SOLN, Place 1 drop into both eyes 3 (three) times daily as needed (for dry/itching eyes). , Disp: , Rfl:  .  Cholecalciferol (VITAMIN D3) 1000 units CAPS, Take 1,000 Units by mouth. , Disp: , Rfl:  .  cycloSPORINE (RESTASIS) 0.05 % ophthalmic emulsion, Place 1 drop into both eyes 3 (three) times daily., Disp: , Rfl:  .  guaiFENesin-dextromethorphan (ROBITUSSIN DM) 100-10 MG/5ML syrup, Take 5 mLs by mouth every 4 (four) hours as needed for cough., Disp: , Rfl:  .  LORazepam (ATIVAN) 0.5 MG tablet, Take 0.5 mg by mouth at bedtime. , Disp: , Rfl:  .  vitamin B-12 (CYANOCOBALAMIN) 1000 MCG tablet, Take 1,000 mcg by mouth daily., Disp: , Rfl:  .  traZODone (DESYREL) 50 MG tablet, Take 1 tablet (50 mg total) by mouth at bedtime as needed for sleep., Disp: 30 tablet, Rfl: 1   Allergies  Allergen Reactions  . Dexamethasone Other (See Comments)    halluncinations  . Ciprofloxacin Other (See Comments)    Unknown reaction per pt  . Meperidine Hcl Nausea And Vomiting and Other (See Comments)    Causes blood pressure to drop when  taken with Phenergan through IV  . Opium Other (See Comments)    Causes blood pressure to drop when taken with Phenergan through IV  . Promethazine Hcl Nausea And Vomiting and Other (See Comments)    Causes blood pressure to drop when taken with Demeral via IV  . Propoxyphene N-Acetaminophen Other (See Comments)    "pass out"     Objective:    BP 109/67   Pulse 71   Temp (!) 97.2 F (36.2 C) (Oral)   Ht 5' 3"  (1.6 m)   Wt 117 lb 3.2 oz (53.2 kg)   BMI 20.76 kg/m  Wt Readings from Last 3 Encounters:  05/25/19 117 lb 3.2 oz (53.2 kg)  10/27/18 120 lb (54.4 kg)  02/24/18 119 lb (54 kg)    Physical Exam  Constitutional: She is oriented to person, place, and time. She appears well-developed and well-nourished. No distress.  HENT:  Head: Normocephalic and atraumatic.  Right Ear: External ear normal.  Left Ear: External ear normal.  Nose: Nose normal.  Mouth/Throat: Oropharynx is clear and moist. She has dentures. No oropharyngeal exudate.  Eyes: Pupils are equal, round, and reactive to light. Conjunctivae and EOM are normal. Right eye exhibits no discharge. Left eye exhibits no discharge. No scleral icterus.  Neck: Normal range of motion. Neck supple. No tracheal deviation present. No thyromegaly present.  Cardiovascular: Normal rate and regular rhythm. Exam reveals no gallop and no friction rub.  No murmur heard. Varicose veins bilaterally.   Pulmonary/Chest: Effort normal and breath sounds normal. No stridor. No respiratory distress. She has no wheezes. She has no rales.  Abdominal: Soft. Bowel sounds are normal. She exhibits no distension and no mass. There is no abdominal tenderness. There is no rebound and no guarding.  Musculoskeletal: Normal range of motion.        General: No edema.  Left elbow: She exhibits normal range of motion, no swelling, no effusion, no deformity and no laceration. Tenderness found. Lateral epicondyle tenderness noted.  Lymphadenopathy:    She  has no cervical adenopathy.  Neurological: She is alert and oriented to person, place, and time.  Skin: Skin is warm and dry. She is not diaphoretic.  Psychiatric: She has a normal mood and affect. Her behavior is normal. Judgment and thought content normal.  Vitals reviewed.   Lab Results  Component Value Date   TSH 1.25 07/07/2016   Lab Results  Component Value Date   WBC 8.7 10/27/2018   HGB 13.5 10/27/2018   HCT 43.3 10/27/2018   MCV 95.8 10/27/2018   PLT 245 10/27/2018   Lab Results  Component Value Date   NA 140 10/27/2018   K 4.0 10/27/2018   CO2 27 10/27/2018   GLUCOSE 112 (H) 10/27/2018   BUN 16 10/27/2018   CREATININE 0.97 10/27/2018   BILITOT 0.6 10/27/2018   ALKPHOS 62 10/27/2018   AST 20 10/27/2018   ALT 12 10/27/2018   PROT 7.2 10/27/2018   ALBUMIN 4.3 10/27/2018   CALCIUM 9.5 10/27/2018   ANIONGAP 7 10/27/2018   GFR 59.17 (L) 07/07/2016   Lab Results  Component Value Date   CHOL 144 11/03/2014   Lab Results  Component Value Date   HDL 76 11/03/2014   Lab Results  Component Value Date   LDLCALC 57 11/03/2014   Lab Results  Component Value Date   TRIG 56 11/03/2014   Lab Results  Component Value Date   CHOLHDL 1.9 11/03/2014   Lab Results  Component Value Date   HGBA1C 6.1 (H) 11/02/2014      Assessment & Plan:   1. GAD (generalized anxiety disorder) - Patient is agreeable to to trying a medication to manage her anxiety long term but we will address Ativan use for sleep first as we do not want to try two new medications at the same time.   2. Insomnia, unspecified type - Patient instructed to wean off Ativan by taking it QOD. On the days she does not take it, she will be taking trazodone 50 mg.   3. Left elbow pain - DG Elbow 2 Views Left; Future  4. History of recent fall - DG Elbow 2 Views Left; Future  5. Brain aneurysm - Managed by Dr. Estanislado Pandy.  6. Superficial varicosities - Ambulatory referral to Vascular Surgery   7. Crohn's disease with complication, unspecified gastrointestinal tract location [facility]) - Managed by Dr. Silverio Decamp.  8. Gastroesophageal reflux disease without esophagitis - Controlled with Tums.  9. Tobacco use - Not ready to quit.   10. 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  11. Encounter to establish care - Transferring from Dr. Murrell Redden office.    Follow-up: Return in about 4 weeks (around 06/22/2019) for follow-up on sleep & anxiety.   [name], MSN, APRN, FNP-C [place]

## 2019-05-30 ENCOUNTER — Other Ambulatory Visit: Payer: Self-pay

## 2019-05-30 ENCOUNTER — Ambulatory Visit (HOSPITAL_COMMUNITY)
Admission: RE | Admit: 2019-05-30 | Discharge: 2019-05-30 | Disposition: A | Discharge status: Home / Self Care | Payer: Medicare Other | Source: Ambulatory Visit | Attending: Family Medicine | Admitting: Family Medicine

## 2019-05-30 DIAGNOSIS — M25522 Pain in left elbow: Secondary | ICD-10-CM | POA: Diagnosis not present

## 2019-05-30 DIAGNOSIS — Z9181 History of falling: Secondary | ICD-10-CM | POA: Diagnosis present

## 2019-05-31 ENCOUNTER — Telehealth: Payer: Self-pay | Admitting: *Deleted

## 2019-05-31 ENCOUNTER — Ambulatory Visit (INDEPENDENT_AMBULATORY_CARE_PROVIDER_SITE_OTHER): Payer: Medicare Other | Admitting: *Deleted

## 2019-05-31 DIAGNOSIS — Z0000 Encounter for general adult medical examination without abnormal findings: Secondary | ICD-10-CM | POA: Diagnosis not present

## 2019-05-31 NOTE — Progress Notes (Signed)
MEDICARE ANNUAL WELLNESS VISIT  05/31/2019  Telephone Visit Disclaimer This Medicare AWV was conducted by telephone due to national recommendations for restrictions regarding the COVID-19 Pandemic (e.g. social distancing).  I verified, using two identifiers, that I am speaking with Stacy Hansen or their authorized healthcare agent. I discussed the limitations, risks, security, and privacy concerns of performing an evaluation and management service by telephone and the potential availability of an in-person appointment in the future. The patient expressed understanding and agreed to proceed.   Subjective:  Stacy Hansen is a 68 y.o. female patient of Loman Brooklyn, FNP who had a Medicare Annual Wellness Visit today via telephone. [name] is Working part time and lives with their spouse. she has 2 children. she reports that she is socially active and does interact with friends/family regularly. she is minimally physically active and enjoys traveling to the mountains.  Patient Care Team: Loman Brooklyn, FNP as PCP - General (Family Medicine) Mauri Pole, MD as Consulting Physician (Gastroenterology) Luanne Bras, MD as Consulting Physician (Interventional Radiology) [facility] (Optometry)  Advanced Directives 05/31/2019 10/27/2018 01/30/2018 11/24/2017 10/09/2017 05/19/2017 04/21/2017  Does Patient Have a Medical Advance Directive? Yes No;Yes Yes No No Yes No  Type of Advance Directive Living will;Healthcare Power of Stacy Hansen;Living will - - - -  Does patient want to make changes to medical advance directive? - - No - Patient declined - - - -  Copy of [place] in Chart? No - copy requested - No - copy requested - - - -  Would patient like information on creating a medical advance directive? - - - - - - -  Pre-existing out of facility DNR order (yellow form or pink MOST form) - - - - - - -     Hospital Utilization Over the Past 12 Months: # of hospitalizations or ER visits: 1 # of surgeries: 0  Review of Systems    Patient reports that her overall health is unchanged compared to last year.  Patient Reported Readings (BP, Pulse, CBG, Weight, etc) none  Review of Systems: History obtained from chart review and the patient Musculoskeletal ROS: positive for - pain in elbow - left and hip - left  All other systems negative.  Pain Assessment       Current Medications & Allergies (verified) Allergies as of 05/31/2019      Reactions   Dexamethasone Other (See Comments)   halluncinations   Ciprofloxacin Other (See Comments)   Unknown reaction per pt   Meperidine Hcl Nausea And Vomiting, Other (See Comments)   Causes blood pressure to drop when taken with Phenergan through IV   Opium Other (See Comments)   Causes blood pressure to drop when taken with Phenergan through IV   Promethazine Hcl Nausea And Vomiting, Other (See Comments)   Causes blood pressure to drop when taken with Demeral via IV   Propoxyphene N-acetaminophen Other (See Comments)   "pass out"      Medication List       Accurate as of May 31, 2019  9:20 AM. If you have any questions, ask your nurse or doctor.        acetaminophen 500 MG tablet Commonly known as: TYLENOL Take 500 mg by mouth every 6 (six) hours as needed for moderate pain or headache.   albuterol 108 (90 Base) MCG/ACT inhaler Commonly known as: VENTOLIN HFA Inhale 2 puffs into the  lungs every 6 (six) hours as needed for wheezing or shortness of breath.   aspirin 325 MG tablet Take 1 tablet (325 mg total) by mouth daily.   carboxymethylcellulose 0.5 % Soln Commonly known as: REFRESH PLUS Place 1 drop into both eyes 3 (three) times daily as needed (for dry/itching eyes).   cycloSPORINE 0.05 % ophthalmic emulsion Commonly known as: RESTASIS Place 1 drop into both eyes 3 (three) times daily.   guaiFENesin-dextromethorphan  100-10 MG/5ML syrup Commonly known as: ROBITUSSIN DM Take 5 mLs by mouth every 4 (four) hours as needed for cough.   LORazepam 0.5 MG tablet Commonly known as: ATIVAN Take 0.5 mg by mouth at bedtime.   traZODone 50 MG tablet Commonly known as: DESYREL Take 1 tablet (50 mg total) by mouth at bedtime as needed for sleep.   vitamin B-12 1000 MCG tablet Commonly known as: CYANOCOBALAMIN Take 1,000 mcg by mouth daily.   Vitamin D3 25 MCG (1000 UT) Caps Take 1,000 Units by mouth.       History (reviewed): Past Medical History:  Diagnosis Date  . Anxiety   . Brain aneurysm   . Crohn disease ([place])   . Depression   . Fibromyalgia   . GERD (gastroesophageal reflux disease)   . Headache   . 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 Hyperplastic colon polyp   . Panic attacks   . Stroke ([place])   . TIA (transient ischemic attack)   . Tobacco abuse    Past Surgical History:  Procedure Laterality Date  . CATARACT EXTRACTION Bilateral   . IR GENERIC HISTORICAL  07/28/2016   IR RADIOLOGIST EVAL & MGMT 07/28/2016 [facility]INTER[facility] RAD  . KNEE ARTHROSCOPY WITH MEDIAL MENISECTOMY Left 12/01/2017   Procedure: LEFT KNEE ARTHROSCOPY WITH PARTIAL MEDIAL AND LATERAL MENISECTOMY;  Surgeon: Carole Civil, MD;  Location: [facility];  Service: Orthopedics;  Laterality: Left;  . ORIF ULNAR FRACTURE Left   . ROTATOR CUFF REPAIR Left   . TONSILLECTOMY    . TUBAL LIGATION    . VAGINAL HYSTERECTOMY     has her ovaries  . VARICOSE VEIN SURGERY Right    Family History  Problem Relation Age of Onset  . Diabetes Mother   . Kidney failure Mother   . Stroke Father   . Colon cancer Brother        in his 9's  . Diabetes Brother   . Diabetes Sister   . Stroke Paternal Grandfather   . Other Sister        perforated bowel with colostomy  . Cancer Brother        Eye   Social History   Socioeconomic History  . Marital status: Married    Spouse name: Denyse Amass   . Number of children: 2  . Years of  education: Not on file  . Highest education level: GED or equivalent  Occupational History  . Occupation: PCA    Employer: OTHER    Comment: [employer]  . Financial resource strain: Not hard at all  . Food insecurity    Worry: Never true    Inability: Never true  . Transportation needs    Medical: No    Non-medical: No  Tobacco Use  . Smoking status: Current Every Day Smoker    Packs/day: 0.50    Years: 20.00    Pack years: 10.00    Types: Cigarettes  . Smokeless tobacco: Never Used  Substance and  Sexual Activity  . Alcohol use: No    Alcohol/week: 0.0 standard drinks  . Drug use: No  . Sexual activity: Yes    Birth control/protection: None  Lifestyle  . Physical activity    Days per week: 0 days    Minutes per session: Not on file  . Stress: To some extent  Relationships  . Social connections    Talks on phone: More than three times a week    Gets together: Twice a week    Attends religious service: More than 4 times per year    Active member of club or organization: No    Attends meetings of clubs or organizations: Never    Relationship status: Married  Other Topics Concern  . Not on file  Social History Narrative  . Not on file    Activities of Daily Living In your present state of health, do you have any difficulty performing the following activities: 05/31/2019  Hearing? Y  Comment hearing loss right ear  Vision? N  Comment wears glasses and patient is due for eye exam  Difficulty concentrating or making decisions? N  Walking or climbing stairs? N  Dressing or bathing? N  Doing errands, shopping? N  Preparing Food and eating ? N  Using the Toilet? N  In the past six months, have you accidently leaked urine? N  Do you have problems with loss of bowel control? N  Managing your Medications? N  Managing your Finances? N  Housekeeping or managing your Housekeeping? N  Some recent data might be hidden    Patient Literacy    Exercise  Current Exercise Habits: The patient does not participate in regular exercise at present, Exercise limited by: neurologic condition(s);Other - see comments(Brain aneurysm)  Diet Patient reports consuming 3 meals a day and 2 snack(s) a day Patient reports that her primary diet is: Regular Patient reports that she does have regular access to food.   Depression Screen PHQ 2/9 Scores 05/31/2019 05/25/2019 05/25/2019  PHQ - 2 Score 1 1 0     Fall Risk Fall Risk  05/31/2019 05/25/2019  Falls in the past year? 1 1  Number falls in past yr: 0 0  Injury with Fall? 1 -  Follow up Falls evaluation completed -     Objective:  Chrissy Ealey Trauger seemed alert and oriented and she participated appropriately during our telephone visit.  Blood Pressure Weight BMI  BP Readings from Last 3 Encounters:  05/25/19 109/67  10/27/18 (!) 102/52  02/24/18 106/67   Wt Readings from Last 3 Encounters:  05/25/19 117 lb 3.2 oz (53.2 kg)  10/27/18 120 lb (54.4 kg)  02/24/18 119 lb (54 kg)   BMI Readings from Last 1 Encounters:  05/25/19 20.76 kg/m    *Unable to obtain current vital signs, weight, and BMI due to telephone visit type  Hearing/Vision  . Brittny did not seem to have difficulty with hearing/understanding during the telephone conversation . Reports that she has not had a formal eye exam by an eye care professional within the past year . Reports that she has not had a formal hearing evaluation within the past year *Unable to fully assess hearing and vision during telephone visit type  Cognitive Function: 6CIT Screen 05/31/2019  What Year? 0 points  What month? 0 points  What time? 0 points  Count back from 20 0 points  Months in reverse 0 points  Repeat phrase 0 points  Total Score 0   (Normal:0-7,  Significant for Dysfunction: >8)  Normal Cognitive Function Screening: Yes   Immunization & Health Maintenance Record Immunization History  Administered Date(s) Administered  . Influenza  Split 09/01/2015  . Influenza, Seasonal, Injecte, Preservative Fre 08/14/2015  . PPD Test 04/08/2018  . Pneumococcal Conjugate-13 12/04/2016  . Pneumococcal Polysaccharide-23 08/14/2015, 03/30/2018  . Tdap 08/14/2015    Health Maintenance  Topic Date Due  . Hepatitis C Screening  [birth year]  . INFLUENZA VACCINE  06/16/2019  . MAMMOGRAM  04/25/2021  . TETANUS/TDAP  08/13/2025  . COLONOSCOPY  09/17/2025  . DEXA SCAN  Completed  . PNA vac Low Risk Adult  Completed       Assessment  This is a routine wellness examination for RHIANNE SOMAN.  Health Maintenance: Due or Overdue Health Maintenance Due  Topic Date Due  . Hepatitis C Screening  1950-11-24    Janene Harvey Berland does not need a referral for Community Assistance: Care Management:   no Social Work:    no Prescription Assistance:  no Nutrition/Diabetes Education:  no   Plan:  Personalized Goals Goals Addressed            This Visit's Progress   . AVW Goals        05/31/2019 AWV Goal: Exercise for General Health   Patient will verbalize understanding of the benefits of increased physical activity:  Exercising regularly is important. It will improve your overall fitness, flexibility, and endurance.  Regular exercise also will improve your overall health. It can help you control your weight, reduce stress, and improve your bone density.  Over the next year, patient will increase physical activity as tolerated with a goal of at least 150 minutes of moderate physical activity per week.   You can tell that you are exercising at a moderate intensity if your heart starts beating faster and you start breathing faster but can still hold a conversation.  Moderate-intensity exercise ideas include:  Walking 1 mile (1.6 km) in about 15 minutes  Biking  Hiking  Golfing  Dancing  Water aerobics  Patient will verbalize understanding of everyday activities that increase physical activity by providing examples  like the following: ? Yard work, such as: ? Pushing a Conservation officer, nature ? Raking and bagging leaves ? Washing your car ? Pushing a stroller ? Shoveling snow ? Gardening ? Washing windows or floors  Patient will be able to explain general safety guidelines for exercising:   Before you start a new exercise program, talk with your health care provider.  Do not exercise so much that you hurt yourself, feel dizzy, or get very short of breath.  Wear comfortable clothes and wear shoes with good support.  Drink plenty of water while you exercise to prevent dehydration or heat stroke.  Work out until your breathing and your heartbeat get faster.  05/31/2019 AWV Goal: Tobacco Cessation  Smoking cessation instruction/counseling given:  counseled patient on the dangers of tobacco use, advised patient to stop smoking, and reviewed strategies to maximize success   Patient will verbalize understanding of the health risks associated with smoking/tobacco use  Lung cancer or lung disease, such as COPD  Heart disease.  Stroke.  Heart attack  Infertility  Osteoporosis and bone fractures. . Patient will create a plan to quit smoking/using tobacco  Pick a date to quit.   Write down the reasons why you are quitting and put it where you will see it often.  Identify the people, places, things, and activities that make you  want to smoke (triggers) and avoid them. Make sure to take these actions: ? Throw away all cigarettes at home, at work, and in your car. ? Throw away smoking accessories, such as Scientist, research (medical). ? Clean your car and make sure to empty the ashtray. ? Clean your home, including curtains and carpets.  Tell your family, friends, and coworkers that you are quitting. Support from your loved ones can make quitting easier.  Talk with your health care provider about your options for quitting smoking.  Find out what treatment options are covered by your health insurance. .  Patient will be able to demonstrate knowledge of tobacco cessation strategies that may maximize success  Quitting "cold [place]" is more successful than gradually quitting.  Attending in-person counseling to help you build problem-solving skills.   Finding resources and support systems that can help you to quit smoking and remain smoke-free after you quit. These resources are most helpful when you use them often. They can include: ? Online chats with a Social worker. ? Telephone quitlines. ? Careers information officer. ? Support groups or group counseling. ? Text messaging programs. ? Mobile phone applications.  Taking medicines to help you quit smoking: ? Nicotine patches, gum, or lozenges. ? Nicotine inhalers or sprays. ? Non-nicotine medicine that is taken by mouth. . Patient will note get discouraged if the process is difficult . Over the next year, patient will stop smoking or using other forms of tobacco  Smoking cessation instruction/counseling given:  counseled patient on the dangers of tobacco use, advised patient to stop smoking, and reviewed strategies to maximize success            Personalized Health Maintenance & Screening Recommendations  Smoking Cessation   Hepatitis C Screening recommended: no HIV Screening recommended: no  Advanced Directives: Written information was not prepared per patient's request.  Referrals & Orders No orders of the defined types were placed in this encounter.   Follow-up Plan . Follow-up with Loman Brooklyn, FNP as planned . Schedule eye exam    I have personally reviewed and noted the following in the patient's chart:   . Medical and social history . Use of alcohol, tobacco or illicit drugs  . Current medications and supplements . Functional ability and status . Nutritional status . Physical activity . Advanced directives . List of other physicians . Hospitalizations, surgeries, and ER visits in previous 12 months .  Vitals . Screenings to include cognitive, depression, and falls . Referrals and appointments  In addition, I have reviewed and discussed with Janene Harvey Stroebel certain preventive protocols, quality metrics, and best practice recommendations. A written personalized care plan for preventive services as well as general preventive health recommendations is available and can be mailed to the patient at her request.     Gareth Morgan, LPN 5/68/1275

## 2019-05-31 NOTE — Patient Instructions (Signed)
Stacy Hansen , Thank you for taking time to come for your Medicare Wellness Visit. I appreciate your ongoing commitment to your health goals. Please review the following plan we discussed and let me know if I can assist you in the future.   These are the goals we discussed: Goals    . AVW Goals      05/31/2019 AWV Goal: Exercise for General Health   Patient will verbalize understanding of the benefits of increased physical activity:  Exercising regularly is important. It will improve your overall fitness, flexibility, and endurance.  Regular exercise also will improve your overall health. It can help you control your weight, reduce stress, and improve your bone density.  Over the next year, patient will increase physical activity as tolerated with a goal of at least 150 minutes of moderate physical activity per week.   You can tell that you are exercising at a moderate intensity if your heart starts beating faster and you start breathing faster but can still hold a conversation.  Moderate-intensity exercise ideas include:  Walking 1 mile (1.6 km) in about 15 minutes  Biking  Hiking  Golfing  Dancing  Water aerobics  Patient will verbalize understanding of everyday activities that increase physical activity by providing examples like the following: ? Yard work, such as: ? Pushing a Conservation officer, nature ? Raking and bagging leaves ? Washing your car ? Pushing a stroller ? Shoveling snow ? Gardening ? Washing windows or floors  Patient will be able to explain general safety guidelines for exercising:   Before you start a new exercise program, talk with your health care provider.  Do not exercise so much that you hurt yourself, feel dizzy, or get very short of breath.  Wear comfortable clothes and wear shoes with good support.  Drink plenty of water while you exercise to prevent dehydration or heat stroke.  Work out until your breathing and your heartbeat get faster.   05/31/2019 AWV Goal: Tobacco Cessation  Smoking cessation instruction/counseling given:  counseled patient on the dangers of tobacco use, advised patient to stop smoking, and reviewed strategies to maximize success   Patient will verbalize understanding of the health risks associated with smoking/tobacco use  Lung cancer or lung disease, such as COPD  Heart disease.  Stroke.  Heart attack  Infertility  Osteoporosis and bone fractures. . Patient will create a plan to quit smoking/using tobacco  Pick a date to quit.   Write down the reasons why you are quitting and put it where you will see it often.  Identify the people, places, things, and activities that make you want to smoke (triggers) and avoid them. Make sure to take these actions: ? Throw away all cigarettes at home, at work, and in your car. ? Throw away smoking accessories, such as Scientist, research (medical). ? Clean your car and make sure to empty the ashtray. ? Clean your home, including curtains and carpets.  Tell your family, friends, and coworkers that you are quitting. Support from your loved ones can make quitting easier.  Talk with your health care provider about your options for quitting smoking.  Find out what treatment options are covered by your health insurance. . Patient will be able to demonstrate knowledge of tobacco cessation strategies that may maximize success  Quitting "cold [place]" is more successful than gradually quitting.  Attending in-person counseling to help you build problem-solving skills.   Finding resources and support systems that can help you to quit smoking and  remain smoke-free after you quit. These resources are most helpful when you use them often. They can include: ? Online chats with a Social worker. ? Telephone quitlines. ? Careers information officer. ? Support groups or group counseling. ? Text messaging programs. ? Mobile phone applications.  Taking medicines to help you  quit smoking: ? Nicotine patches, gum, or lozenges. ? Nicotine inhalers or sprays. ? Non-nicotine medicine that is taken by mouth. . Patient will note get discouraged if the process is difficult . Over the next year, patient will stop smoking or using other forms of tobacco  Smoking cessation instruction/counseling given:  counseled patient on the dangers of tobacco use, advised patient to stop smoking, and reviewed strategies to maximize success             This is a list of the screening recommended for you and due dates:  Health Maintenance  Topic Date Due  . Flu Shot  06/16/2019  . Mammogram  04/25/2021  . Tetanus Vaccine  08/13/2025  . Colon Cancer Screening  09/17/2025  . DEXA scan (bone density measurement)  Completed  .  Hepatitis C: One time screening is recommended by Center for Disease Control  (CDC) for  adults born from [birth year] through 1965.   Completed  . Pneumonia vaccines  Completed

## 2019-05-31 NOTE — Telephone Encounter (Signed)
Patient knows xray results of elbow were normal. Patient is still complaining with elbow pain and wants to know what the next steps would be. Please advise

## 2019-06-01 NOTE — Telephone Encounter (Signed)
Aware of provider's advice. 

## 2019-06-19 ENCOUNTER — Telehealth (HOSPITAL_COMMUNITY): Payer: Self-pay

## 2019-06-19 NOTE — Telephone Encounter (Signed)
Called to schedule f/u mri, no answer, left vm. AW

## 2019-06-21 ENCOUNTER — Ambulatory Visit: Payer: Medicare Other | Admitting: Family Medicine

## 2019-06-25 ENCOUNTER — Other Ambulatory Visit: Payer: Self-pay | Admitting: Physician Assistant

## 2019-06-25 ENCOUNTER — Other Ambulatory Visit (HOSPITAL_COMMUNITY): Payer: Self-pay | Admitting: Interventional Radiology

## 2019-06-25 ENCOUNTER — Other Ambulatory Visit (HOSPITAL_COMMUNITY): Payer: Self-pay | Admitting: Physician Assistant

## 2019-06-25 DIAGNOSIS — I671 Cerebral aneurysm, nonruptured: Secondary | ICD-10-CM

## 2019-06-25 MED ORDER — LORAZEPAM 2 MG PO TABS: 2.0000 mg | ORAL_TABLET | Freq: Once | ORAL | 0 refills | Status: AC

## 2019-06-27 NOTE — Progress Notes (Deleted)
Assessment & Plan:  ***  No follow-ups on file.  [name], MSN, APRN, FNP-C [facility]  Subjective:    Patient ID: Stacy Hansen, female    DOB: 1951/01/15, 68 y.o.   MRN: [id number]  Patient Care Team: Loman Brooklyn, FNP as PCP - General (Family Medicine) Mauri Pole, MD as Consulting Physician (Gastroenterology) Luanne Bras, MD as Consulting Physician (Interventional Radiology) [facility])   Chief Complaint:  No chief complaint on file.   HPI: Stacy Hansen is a 68 y.o. female presenting on 06/29/2019 for No chief complaint on file.  Patient was instructed on 05/25/2019 to take Ativan QOD and to take Trazodone 50 mg on the nights she does not take Ativan. She ***  GAD 7 : Generalized Anxiety Score 05/25/2019  Nervous, Anxious, on Edge 1  Control/stop worrying 3  Worry too much - different things 3  Trouble relaxing 3  Restless 1  Easily annoyed or irritable 3  Afraid - awful might happen 1  Total GAD 7 Score 15  Anxiety Difficulty Somewhat difficult    New complaints: ***  Social history:  Relevant past medical, surgical, family and social history reviewed and updated as indicated. Interim medical history since our last visit reviewed.  Allergies and medications reviewed and updated.  DATA REVIEWED: CHART IN EPIC  ROS: Negative unless specifically indicated above in HPI.    Current Outpatient Medications:  .  acetaminophen (TYLENOL) 500 MG tablet, Take 500 mg by mouth every 6 (six) hours as needed for moderate pain or headache. , Disp: , Rfl:  .  albuterol (PROVENTIL HFA;VENTOLIN HFA) 108 (90 BASE) MCG/ACT inhaler, Inhale 2 puffs into the lungs every 6 (six) hours as needed for wheezing or shortness of breath., Disp: , Rfl:  .  aspirin 325 MG tablet, Take 1 tablet (325 mg total) by mouth daily., Disp: , Rfl:  .  carboxymethylcellulose (REFRESH PLUS) 0.5 % SOLN, Place 1 drop into both eyes 3 (three)  times daily as needed (for dry/itching eyes). , Disp: , Rfl:  .  Cholecalciferol (VITAMIN D3) 1000 units CAPS, Take 1,000 Units by mouth. , Disp: , Rfl:  .  cycloSPORINE (RESTASIS) 0.05 % ophthalmic emulsion, Place 1 drop into both eyes 3 (three) times daily., Disp: , Rfl:  .  guaiFENesin-dextromethorphan (ROBITUSSIN DM) 100-10 MG/5ML syrup, Take 5 mLs by mouth every 4 (four) hours as needed for cough., Disp: , Rfl:  .  LORazepam (ATIVAN) 0.5 MG tablet, Take 0.5 mg by mouth at bedtime. , Disp: , Rfl:  .  traZODone (DESYREL) 50 MG tablet, Take 1 tablet (50 mg total) by mouth at bedtime as needed for sleep., Disp: 30 tablet, Rfl: 1 .  vitamin B-12 (CYANOCOBALAMIN) 1000 MCG tablet, Take 1,000 mcg by mouth daily., Disp: , Rfl:   Allergies  Allergen Reactions  . Dexamethasone Other (See Comments)    halluncinations  . Ciprofloxacin Other (See Comments)    Unknown reaction per pt  . Meperidine Hcl Nausea And Vomiting and Other (See Comments)    Causes blood pressure to drop when taken with Phenergan through IV  . Opium Other (See Comments)    Causes blood pressure to drop when taken with Phenergan through IV  . Promethazine Hcl Nausea And Vomiting and Other (See Comments)    Causes blood pressure to drop when taken with Demeral via IV  . Propoxyphene N-Acetaminophen Other (See Comments)    "pass out"   Past Medical History:  Diagnosis Date  . Anxiety   . Brain aneurysm   . Crohn disease ([place])   . Depression   . Fibromyalgia   . GERD (gastroesophageal reflux disease)   . Headache   . 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 Hyperplastic colon polyp   . Panic attacks   . Stroke ([place])   . TIA (transient ischemic attack)   . Tobacco abuse     Past Surgical History:  Procedure Laterality Date  . CATARACT EXTRACTION Bilateral   . IR GENERIC HISTORICAL  07/28/2016   IR RADIOLOGIST EVAL & MGMT 07/28/2016 [facility]-INTERV RAD  . KNEE ARTHROSCOPY WITH MEDIAL MENISECTOMY Left 12/01/2017    Procedure: LEFT KNEE ARTHROSCOPY WITH PARTIAL MEDIAL AND LATERAL MENISECTOMY;  Surgeon: Carole Civil, MD;  Location: [facility];  Service: Orthopedics;  Laterality: Left;  . ORIF ULNAR FRACTURE Left   . ROTATOR CUFF REPAIR Left   . TONSILLECTOMY    . TUBAL LIGATION    . VAGINAL HYSTERECTOMY     has her ovaries  . VARICOSE VEIN SURGERY Right     Social History   Socioeconomic History  . Marital status: Married    Spouse name: Stacy Hansen   . Number of children: 2  . Years of education: Not on file  . Highest education level: GED or equivalent  Occupational History  . Occupation: PCA    Employer: OTHER    Comment: [employer]  . Financial resource strain: Not hard at all  . Food insecurity    Worry: Never true    Inability: Never true  . Transportation needs    Medical: No    Non-medical: No  Tobacco Use  . Smoking status: Current Every Day Smoker    Packs/day: 0.50    Years: 20.00    Pack years: 10.00    Types: Cigarettes  . Smokeless tobacco: Never Used  Substance and Sexual Activity  . Alcohol use: No    Alcohol/week: 0.0 standard drinks  . Drug use: No  . Sexual activity: Yes    Birth control/protection: None  Lifestyle  . Physical activity    Days per week: 0 days    Minutes per session: Not on file  . Stress: To some extent  Relationships  . Social connections    Talks on phone: More than three times a week    Gets together: Twice a week    Attends religious service: More than 4 times per year    Active member of club or organization: No    Attends meetings of clubs or organizations: Never    Relationship status: Married  . Intimate partner violence    Fear of current or ex partner: No    Emotionally abused: No    Physically abused: No    Forced sexual activity: No  Other Topics Concern  . Not on file  Social History Narrative  . Not on file        Objective:    There were no vitals taken for this visit. Physical Exam  Lab  Results  Component Value Date   TSH 1.25 07/07/2016   Lab Results  Component Value Date   WBC 8.7 10/27/2018   HGB 13.5 10/27/2018   HCT 43.3 10/27/2018   MCV 95.8 10/27/2018   PLT 245 10/27/2018   Lab Results  Component Value Date   NA 140 10/27/2018   K 4.0 10/27/2018   CO2 27 10/27/2018  GLUCOSE 112 (H) 10/27/2018   BUN 16 10/27/2018   CREATININE 0.97 10/27/2018   BILITOT 0.6 10/27/2018   ALKPHOS 62 10/27/2018   AST 20 10/27/2018   ALT 12 10/27/2018   PROT 7.2 10/27/2018   ALBUMIN 4.3 10/27/2018   CALCIUM 9.5 10/27/2018   ANIONGAP 7 10/27/2018   GFR 59.17 (L) 07/07/2016   Lab Results  Component Value Date   CHOL 144 11/03/2014   Lab Results  Component Value Date   HDL 76 11/03/2014   Lab Results  Component Value Date   LDLCALC 57 11/03/2014   Lab Results  Component Value Date   TRIG 56 11/03/2014   Lab Results  Component Value Date   CHOLHDL 1.9 11/03/2014   Lab Results  Component Value Date   HGBA1C 6.1 (H) 11/02/2014

## 2019-06-28 ENCOUNTER — Telehealth: Payer: Self-pay | Admitting: Family Medicine

## 2019-06-29 ENCOUNTER — Ambulatory Visit: Payer: Medicare Other | Admitting: Family Medicine

## 2019-07-04 ENCOUNTER — Encounter (HOSPITAL_COMMUNITY): Payer: Medicare Other

## 2019-07-04 ENCOUNTER — Encounter: Payer: Medicare Other | Admitting: Vascular Surgery

## 2019-07-04 ENCOUNTER — Ambulatory Visit (HOSPITAL_COMMUNITY): Payer: Medicare Other

## 2019-07-10 ENCOUNTER — Other Ambulatory Visit: Payer: Self-pay

## 2019-07-10 DIAGNOSIS — I83893 Varicose veins of bilateral lower extremities with other complications: Secondary | ICD-10-CM

## 2019-07-11 ENCOUNTER — Telehealth (HOSPITAL_COMMUNITY): Payer: Self-pay

## 2019-07-11 NOTE — Telephone Encounter (Signed)
The above patient or their representative was contacted and gave the following answers to these questions:         Do you have any of the following symptoms? no  Fever                    Cough                   Shortness of breath  Do  you have any of the following other symptoms? no   muscle pain         vomiting,        diarrhea        rash         weakness        red eye        abdominal pain         bruising          bruising or bleeding              joint pain           severe headache    Have you been in contact with someone who was or has been sick in the past 2 weeks? no  Yes                 Unsure                         Unable to assess   Does the person that you were in contact with have any of the following symptoms?   Cough         shortness of breath           muscle pain         vomiting,            diarrhea            rash            weakness           fever            red eye           abdominal pain           bruising  or  bleeding                joint pain                severe headache                COMMENTS OR ACTION PLAN FOR THIS PATIENT:

## 2019-07-12 ENCOUNTER — Ambulatory Visit (HOSPITAL_COMMUNITY)
Admission: RE | Admit: 2019-07-12 | Discharge: 2019-07-12 | Disposition: A | Discharge status: Home / Self Care | Payer: Medicare Other | Source: Ambulatory Visit | Attending: Vascular Surgery | Admitting: Vascular Surgery

## 2019-07-12 ENCOUNTER — Ambulatory Visit: Payer: Medicare Other | Admitting: Vascular Surgery

## 2019-07-12 ENCOUNTER — Other Ambulatory Visit: Payer: Self-pay

## 2019-07-12 ENCOUNTER — Encounter: Payer: Self-pay | Admitting: Vascular Surgery

## 2019-07-12 DIAGNOSIS — I83893 Varicose veins of bilateral lower extremities with other complications: Secondary | ICD-10-CM | POA: Insufficient documentation

## 2019-07-17 ENCOUNTER — Encounter: Payer: Self-pay | Admitting: Family Medicine

## 2019-07-19 ENCOUNTER — Other Ambulatory Visit: Payer: Self-pay

## 2019-07-19 ENCOUNTER — Ambulatory Visit (HOSPITAL_COMMUNITY)
Admission: RE | Admit: 2019-07-19 | Discharge: 2019-07-19 | Disposition: A | Discharge status: Home / Self Care | Payer: Medicare Other | Source: Ambulatory Visit | Attending: Interventional Radiology | Admitting: Interventional Radiology

## 2019-07-19 DIAGNOSIS — I671 Cerebral aneurysm, nonruptured: Secondary | ICD-10-CM | POA: Insufficient documentation

## 2019-07-24 ENCOUNTER — Telehealth (HOSPITAL_COMMUNITY): Payer: Self-pay

## 2019-07-24 NOTE — Telephone Encounter (Signed)
Pt agreed to f/u in 1 year with mri/mra wo. AW

## 2019-07-25 ENCOUNTER — Ambulatory Visit (INDEPENDENT_AMBULATORY_CARE_PROVIDER_SITE_OTHER): Payer: Medicare Other | Admitting: Family Medicine

## 2019-07-25 ENCOUNTER — Encounter: Payer: Self-pay | Admitting: Family Medicine

## 2019-07-25 ENCOUNTER — Other Ambulatory Visit: Payer: Self-pay

## 2019-07-25 VITALS — BP 112/75 | HR 65 | Temp 97.7°F | Ht 63.0 in | Wt 118.6 lb

## 2019-07-25 DIAGNOSIS — F411 Generalized anxiety disorder: Secondary | ICD-10-CM

## 2019-07-25 DIAGNOSIS — E785 Hyperlipidemia, unspecified: Secondary | ICD-10-CM

## 2019-07-25 DIAGNOSIS — E559 Vitamin D deficiency, unspecified: Secondary | ICD-10-CM

## 2019-07-25 DIAGNOSIS — K219 Gastro-esophageal reflux disease without esophagitis: Secondary | ICD-10-CM

## 2019-07-25 DIAGNOSIS — Z720 Tobacco use: Secondary | ICD-10-CM | POA: Diagnosis not present

## 2019-07-25 DIAGNOSIS — E538 Deficiency of other specified B group vitamins: Secondary | ICD-10-CM | POA: Diagnosis not present

## 2019-07-25 DIAGNOSIS — R918 Other nonspecific abnormal finding of lung field: Secondary | ICD-10-CM

## 2019-07-25 DIAGNOSIS — M8580 Other specified disorders of bone density and structure, unspecified site: Secondary | ICD-10-CM | POA: Diagnosis not present

## 2019-07-25 DIAGNOSIS — K5000 Crohn's disease of small intestine without complications: Secondary | ICD-10-CM

## 2019-07-25 DIAGNOSIS — R490 Dysphonia: Secondary | ICD-10-CM

## 2019-07-25 DIAGNOSIS — J301 Allergic rhinitis due to pollen: Secondary | ICD-10-CM

## 2019-07-25 DIAGNOSIS — Z8673 Personal history of transient ischemic attack (TIA), and cerebral infarction without residual deficits: Secondary | ICD-10-CM

## 2019-07-25 DIAGNOSIS — I671 Cerebral aneurysm, nonruptured: Secondary | ICD-10-CM

## 2019-07-25 DIAGNOSIS — G4700 Insomnia, unspecified: Secondary | ICD-10-CM

## 2019-07-25 NOTE — Patient Instructions (Addendum)
Calcium 593m twice a day Vit D   labwork fasting  ENT referral for hearing loss-right ear, hoarseness and difficulty [id number]  psy referral recommended

## 2019-07-25 NOTE — Progress Notes (Signed)
New Patient Office Visit  Subjective:  Patient ID: Stacy Hansen, female    DOB: [birth year]  Age: 68 y.o. MRN: [id number]  CC:  Chief Complaint  Patient presents with  . New Patient (Initial Visit)  . Ear Pain    left ear pain    HPI Stacy Hansen presents for left ear pain-irritation  Insomnia-took ativan in the past for sleep.  Pt given trazodone for sleep by a previous primary care provider -pt refused to try medication. Pt states she has taken ativan for sleep for the last 20 years and nothing else works. 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 Pt states I'm the third doctor who will not give her this medication.   Brain aneurysm-sees neurosurgery crohns disease/GERD -sees gastro Hearing loss right ear-birth defect Irritation left ear x 3 weeks-no hearing loss left ear, no drainage Difficulty swallowing on the left side, sticking sensation Hoarseness-new onset in the last 6 months TIA-swallowing may be related-worsening, takes asa tob use-pulmonary nodules noted on CT-no follow up recommended Osteopenia-reviewed DEXA 2017 with pt-no vit d level-states she takes 1000IU-chart states deficiency Mammogram-normal 6/20    Past Medical History:  Diagnosis Date  . Anxiety   . Brain aneurysm   . Crohn disease ([place])   . Depression   . Fibromyalgia   . GERD (gastroesophageal reflux disease)   . Headache   . 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 Hyperplastic colon polyp   . Panic attacks   . Stroke ([place])   . TIA (transient ischemic attack)   . Tobacco abuse     Past Surgical History:  Procedure Laterality Date  . CATARACT EXTRACTION Bilateral   . EYE SURGERY    . IR GENERIC HISTORICAL  07/28/2016   IR RADIOLOGIST EVAL & MGMT 07/28/2016 [facility]INTER[facility]  . KNEE ARTHROSCOPY WITH MEDIAL MENISECTOMY Left 12/01/2017   Procedure: LEFT KNEE ARTHROSCOPY WITH PARTIAL  MEDIAL AND LATERAL MENISECTOMY;  Surgeon: Carole Civil, MD;  Location: [facility];  Service: Orthopedics;  Laterality: Left;  . ORIF ULNAR FRACTURE Left   . ROTATOR CUFF REPAIR Left   . TONSILLECTOMY    . TUBAL LIGATION    . VAGINAL HYSTERECTOMY     has her ovaries  . VARICOSE VEIN SURGERY Right     Family History  Problem Relation Age of Onset  . Diabetes Mother   . Kidney failure Mother   . Stroke Father   . Colon cancer Brother        in his 76's  . Diabetes Brother   . Diabetes Sister   . Stroke Paternal Grandfather   . Other Sister        perforated bowel with colostomy  . Cancer Brother        Eye    Social History  Works in [employer] with husband 1/2 pack over 2 days Socioeconomic History  . Marital status: Married    Spouse name: Denyse Amass   . Number of children: 2  . Years of education: Not on file  . Highest education level: GED or equivalent  Occupational History  . Occupation: PCA    Employer: OTHER    Comment: [place]  . Financial resource strain: Not hard at all  . Food insecurity    Worry: Never true    Inability:  Never true  . Transportation needs    Medical: No    Non-medical: No  Tobacco Use  . Smoking status: Current Every Day Smoker    Packs/day: 0.50    Years: 20.00    Pack years: 10.00    Types: Cigarettes  . Smokeless tobacco: Never Used  Substance and Sexual Activity  . Alcohol use: No    Alcohol/week: 0.0 standard drinks  . Drug use: No  . Sexual activity: Not Currently    Birth control/protection: None  Lifestyle  . Physical activity    Days per week: 0 days    Minutes per session: Not on file  . Stress: To some extent  Relationships  . Social connections    Talks on phone: More than three times a week    Gets together: Twice a week    Attends religious service: More than 4 times per year    Active member of club or organization: No    Attends meetings of clubs or organizations: Never    Relationship  status: Married  . Intimate partner violence    Fear of current or ex partner: No    Emotionally abused: No    Physically abused: No    Forced sexual activity: No  Other Topics Concern  . Not on file  Social History Narrative  . Not on file    ROS Review of Systems  HENT: Positive for congestion, hearing loss and trouble swallowing.   Eyes: Positive for photophobia.  Musculoskeletal: Positive for myalgias.  Neurological: Negative for headaches.  Psychiatric/Behavioral: Positive for sleep disturbance. The patient is nervous/anxious.     Objective:   Today's Vitals: BP 112/75 (BP Location: Left Arm, Patient Position: Sitting, Cuff Size: Normal)   Pulse 65   Temp 97.7 F (36.5 C) (Oral)   Ht 5' 3"  (1.6 m)   Wt 118 lb 9.6 oz (53.8 kg)   SpO2 98%   BMI 21.01 kg/m   Physical Exam Constitutional:      Appearance: Normal appearance.  HENT:     Head: Normocephalic and atraumatic.     Comments: Left ear canal-eryth, papule, crusting    Right Ear: Tympanic membrane and external ear normal.     Left Ear: Tympanic membrane and external ear normal.     Nose: Nose normal.     Mouth/Throat:     Mouth: Mucous membranes are moist.  Eyes:     Conjunctiva/sclera: Conjunctivae normal.  Cardiovascular:     Rate and Rhythm: Normal rate and regular rhythm.     Pulses: Normal pulses.     Heart sounds: Normal heart sounds.  Pulmonary:     Effort: Pulmonary effort is normal.     Breath sounds: Normal breath sounds.  Musculoskeletal: Normal range of motion.  Neurological:     General: No focal deficit present.     Mental Status: She is alert.  Psychiatric:        Mood and Affect: Mood normal.     Assessment & Plan:    1. Tobacco use Encouraged pt to quit-anxiety noted - Vitamin B12 - TSH - Lipid panel - COMPLETE METABOLIC PANEL WITH GFR - CBC with Differential  2. Osteopenia, unspecified location DEXA - VITAMIN D 25 Hydroxy (Vit-D Deficiency, Fractures) - CBC with  Differential No recent level-takes 1000IU 3. Vitamin B12 deficiency Noted she took shots in the past - CBC with Differential  4. GAD (generalized anxiety disorder) D/w pt no Ativan rx by this provider-suggested psy  to wean to another medication-pt declined. Refused to consider another medication  5. Insomnia, unspecified type Refused to consider another non -controlled medication for anxiety 6. Brain aneurysm Neurosurgery following-MRI yearly  7. Gastroesophageal reflux disease without esophagitis crohns-in remission-colonocopy-follow up Dr. Stevphen [place] 8. Multiple pulmonary nodules CT no follow up needed per last CT  9. Dyslipidemia Check lipid panel  10. Vitamin D deficiency Vit D level-pt taking 1000IU  11. H/O: CVA (cerebrovascular accident) Neuro following  12. Crohn's disease of small intestine without complication ([place]) Gi following  13. Seasonal allergic rhinitis due to pollen [facility] COPD per pt-suggested flu vaccine-pt declined stating she did not know what was in there Follow-up: 6 months-pt to call if she selects another provider  Beckett Maden Hannah Beat, MD

## 2019-08-16 ENCOUNTER — Encounter: Payer: Self-pay | Admitting: Vascular Surgery

## 2019-08-16 ENCOUNTER — Ambulatory Visit (INDEPENDENT_AMBULATORY_CARE_PROVIDER_SITE_OTHER): Payer: Medicare Other | Admitting: Vascular Surgery

## 2019-08-16 ENCOUNTER — Other Ambulatory Visit: Payer: Self-pay

## 2019-08-16 VITALS — BP 106/59 | HR 77 | Temp 97.1°F | Resp 14 | Ht 63.0 in | Wt 119.0 lb

## 2019-08-16 DIAGNOSIS — I872 Venous insufficiency (chronic) (peripheral): Secondary | ICD-10-CM

## 2019-08-16 NOTE — Progress Notes (Signed)
REASON FOR CONSULT:    Chronic venous insufficiency.  Patient was self-referred.  ASSESSMENT & PLAN:   CHRONIC VENOUS INSUFFICIENCY: This patient has CEAP C2 venous disease.  She does have some superficial venous reflux bilaterally in the great saphenous veins with currently the veins are not especially dilated.  We have discussed the importance of intermittent leg elevation the proper positioning for this.  I have given her a prescription for knee-high compression stockings with a gradient of 15 to 20 mmHg.  I have encouraged her to avoid prolonged sitting and standing.  We discussed the importance of walking specifically walking and water aerobics.  She would like to consider sclerotherapy for her spider veins and I think she would be a good candidate for that especially on the right side.  Given that she has more significant superficial venous reflux on the left I explained on the left side she would be at higher risk for recurrence.  I have given her the number for Stacy Hansen our sclerotherapy nurse and she will be in contact with her.  The patient would like to be seen back in a year and so I will arrange a follow-up visit at that time.  Stacy Mayo, MD, FACS [facility] [phone] Office: [phone]   HPI:   Stacy Hansen is a pleasant 68 y.o. female, but was scheduled to see on 07/12/2019.  She came in and had a venous reflux study but then left without being seen.  She comes in today to discuss her results.  Of note I did discuss her results with her over the phone after she had left.  Patient has a history of aching pain and heaviness in both lower extremities which is aggravated by standing and sitting and relieved with elevation.  She has not been wearing compression stockings.  She does take Tylenol for pain.  She states that she cannot take ibuprofen because of her Crohn's disease.  Her symptoms have been relatively stable over the last 6 months.  She denies any previous  history of DVT or phlebitis.  Past Medical History:  Diagnosis Date  . Anxiety   . Brain aneurysm   . Crohn disease ([place])   . Depression   . Fibromyalgia   . GERD (gastroesophageal reflux disease)   . Headache   . 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 Hyperplastic colon polyp   . Panic attacks   . Stroke ([place])   . TIA (transient ischemic attack)   . Tobacco abuse     Family History  Problem Relation Age of Onset  . Diabetes Mother   . Kidney failure Mother   . Stroke Father   . Colon cancer Brother        in his 52's  . Diabetes Brother   . Diabetes Sister   . Stroke Paternal Grandfather   . Other Sister        perforated bowel with colostomy  . Cancer Brother        Eye    SOCIAL HISTORY: Social History   Socioeconomic History  . Marital status: Married    Spouse name: Denyse Amass   . Number of children: 2  . Years of education: Not on file  . Highest education level: GED or equivalent  Occupational History  . Occupation: PCA    Employer: OTHER    Comment: [place]  . Financial resource strain: Not hard at all  . Food insecurity  Worry: Never true    Inability: Never true  . Transportation needs    Medical: No    Non-medical: No  Tobacco Use  . Smoking status: Current Every Day Smoker    Packs/day: 0.50    Years: 20.00    Pack years: 10.00    Types: Cigarettes  . Smokeless tobacco: Never Used  Substance and Sexual Activity  . Alcohol use: No    Alcohol/week: 0.0 standard drinks  . Drug use: No  . Sexual activity: Not Currently    Birth control/protection: None  Lifestyle  . Physical activity    Days per week: 0 days    Minutes per session: Not on file  . Stress: To some extent  Relationships  . Social connections    Talks on phone: More than three times a week    Gets together: Twice a week    Attends religious service: More than 4 times per year    Active member of club or organization: No    Attends meetings of  clubs or organizations: Never    Relationship status: Married  . Intimate partner violence    Fear of current or ex partner: No    Emotionally abused: No    Physically abused: No    Forced sexual activity: No  Other Topics Concern  . Not on file  Social History Narrative  . Not on file    Allergies  Allergen Reactions  . Dexamethasone Other (See Comments)    halluncinations  . Ciprofloxacin Other (See Comments)    Unknown reaction per pt  . Meperidine Hcl Nausea And Vomiting and Other (See Comments)    Causes blood pressure to drop when taken with Phenergan through IV  . Opium Other (See Comments)    Causes blood pressure to drop when taken with Phenergan through IV  . Promethazine Hcl Nausea And Vomiting and Other (See Comments)    Causes blood pressure to drop when taken with Demeral via IV  . Propoxyphene N-Acetaminophen Other (See Comments)    "pass out"    Current Outpatient Medications  Medication Sig Dispense Refill  . acetaminophen (TYLENOL) 500 MG tablet Take 500 mg by mouth every 6 (six) hours as needed for moderate pain or headache.     . albuterol (PROVENTIL HFA;VENTOLIN HFA) 108 (90 BASE) MCG/ACT inhaler Inhale 2 puffs into the lungs every 6 (six) hours as needed for wheezing or shortness of breath.    Marland Kitchen aspirin 325 MG tablet Take 1 tablet (325 mg total) by mouth daily.    . carboxymethylcellulose (REFRESH PLUS) 0.5 % SOLN Place 1 drop into both eyes 3 (three) times daily as needed (for dry/itching eyes).     . Cholecalciferol (VITAMIN D3) 1000 units CAPS Take 1,000 Units by mouth.     . cycloSPORINE (RESTASIS) 0.05 % ophthalmic emulsion Place 1 drop into both eyes 3 (three) times daily.    Marland Kitchen guaiFENesin-dextromethorphan (ROBITUSSIN DM) 100-10 MG/5ML syrup Take 5 mLs by mouth every 4 (four) hours as needed for cough.    Marland Kitchen LORazepam (ATIVAN) 0.5 MG tablet Take 0.5 mg by mouth at bedtime.     . traZODone (DESYREL) 50 MG tablet Take 1 tablet (50 mg total) by mouth at  bedtime as needed for sleep. 30 tablet 1  . vitamin B-12 (CYANOCOBALAMIN) 1000 MCG tablet Take 1,000 mcg by mouth daily.     No current facility-administered medications for this visit.     REVIEW OF SYSTEMS:  [X]  denotes  positive finding, [ ]  denotes negative finding Cardiac  Comments:  Chest pain or chest pressure:    Shortness of breath upon exertion:    Short of breath when lying flat:    Irregular heart rhythm:        Vascular    Pain in calf, thigh, or hip brought on by ambulation: x   Pain in feet at night that wakes you up from your sleep:  x   Blood clot in your veins:    Leg swelling:         Pulmonary    Oxygen at home:    Productive cough:     Wheezing:         Neurologic    Sudden weakness in arms or legs:  x   Sudden numbness in arms or legs:  x   Sudden onset of difficulty speaking or slurred speech:    Temporary loss of vision in one eye:     Problems with dizziness:  x       Gastrointestinal    Blood in stool:     Vomited blood:         Genitourinary    Burning when urinating:     Blood in urine:        Psychiatric    Major depression:         Hematologic    Bleeding problems:    Problems with blood clotting too easily:        Skin    Rashes or ulcers:        Constitutional    Fever or chills:     PHYSICAL EXAM:   Vitals:   08/16/19 1312  BP: (!) 106/59  Pulse: 77  Resp: 14  Temp: (!) 97.1 F (36.2 C)  TempSrc: Temporal  SpO2: 98%  Weight: 119 lb (54 kg)  Height: 5' 3"  (1.6 m)    GENERAL: The patient is a well-nourished female, in no acute distress. The vital signs are documented above. CARDIAC: There is a regular rate and rhythm.  VASCULAR: I do not detect carotid bruits. She has palpable pedal pulses. She has telangiectasias and spider veins bilaterally but no large truncal varicosities.  There is no significant leg swelling and no hyperpigmentation.  RIGHT LEG:   PULMONARY: There is good air exchange bilaterally without  wheezing or rales. ABDOMEN: Soft and non-tender with normal pitched bowel sounds.  MUSCULOSKELETAL: There are no major deformities or cyanosis. NEUROLOGIC: No focal weakness or paresthesias are detected. SKIN: There are no ulcers or rashes noted. PSYCHIATRIC: The patient has a normal affect.  DATA:    VENOUS DUPLEX: I did independently interpret her venous duplex scan.  On the right side there is no evidence of DVT.  There is no significant deep venous reflux.  There was superficial venous reflux involving only the distal right great saphenous vein and saphenous vein in the proximal calf.  The vein was not especially dilated.  On the left side there was no evidence of DVT.  There was no deep venous reflux.  There was superficial venous reflux involving the great saphenous vein from the saphenofemoral junction to the distal thigh.  The vein was not especially dilated.

## 2019-09-06 ENCOUNTER — Ambulatory Visit (INDEPENDENT_AMBULATORY_CARE_PROVIDER_SITE_OTHER): Payer: Medicare Other | Admitting: Otolaryngology

## 2019-09-06 ENCOUNTER — Other Ambulatory Visit: Payer: Self-pay

## 2019-09-06 DIAGNOSIS — K219 Gastro-esophageal reflux disease without esophagitis: Secondary | ICD-10-CM

## 2019-09-06 DIAGNOSIS — J381 Polyp of vocal cord and larynx: Secondary | ICD-10-CM

## 2019-09-06 DIAGNOSIS — R1312 Dysphagia, oropharyngeal phase: Secondary | ICD-10-CM | POA: Diagnosis not present

## 2019-09-06 DIAGNOSIS — H6122 Impacted cerumen, left ear: Secondary | ICD-10-CM

## 2019-09-06 DIAGNOSIS — H903 Sensorineural hearing loss, bilateral: Secondary | ICD-10-CM | POA: Diagnosis not present

## 2019-12-12 DIAGNOSIS — M129 Arthropathy, unspecified: Secondary | ICD-10-CM | POA: Insufficient documentation

## 2020-01-23 ENCOUNTER — Ambulatory Visit: Payer: Medicare Other | Admitting: Family Medicine

## 2020-02-27 DIAGNOSIS — M8588 Other specified disorders of bone density and structure, other site: Secondary | ICD-10-CM | POA: Insufficient documentation

## 2020-07-18 ENCOUNTER — Other Ambulatory Visit (HOSPITAL_COMMUNITY): Payer: Self-pay | Admitting: Interventional Radiology

## 2020-07-18 ENCOUNTER — Other Ambulatory Visit (HOSPITAL_COMMUNITY): Payer: Self-pay | Admitting: Student

## 2020-07-18 DIAGNOSIS — I671 Cerebral aneurysm, nonruptured: Secondary | ICD-10-CM

## 2020-07-18 MED ORDER — LORAZEPAM 2 MG PO TABS: ORAL_TABLET | ORAL | 0 refills | Status: DC

## 2020-08-06 ENCOUNTER — Ambulatory Visit (HOSPITAL_COMMUNITY): Payer: Medicare Other

## 2020-08-21 ENCOUNTER — Other Ambulatory Visit: Payer: Self-pay

## 2020-08-21 ENCOUNTER — Ambulatory Visit (HOSPITAL_COMMUNITY)
Admission: RE | Admit: 2020-08-21 | Discharge: 2020-08-21 | Disposition: A | Discharge status: Home / Self Care | Payer: Medicare Other | Source: Ambulatory Visit | Attending: Interventional Radiology | Admitting: Interventional Radiology

## 2020-08-21 ENCOUNTER — Ambulatory Visit (HOSPITAL_COMMUNITY): Payer: Medicare Other

## 2020-08-21 DIAGNOSIS — I671 Cerebral aneurysm, nonruptured: Secondary | ICD-10-CM | POA: Insufficient documentation

## 2020-08-21 MED ORDER — GADOBUTROL 1 MMOL/ML IV SOLN
5.0000 mL | Freq: Once | INTRAVENOUS | Status: AC | PRN
  Administered 2020-08-21: 5 mL via INTRAVENOUS

## 2021-09-23 ENCOUNTER — Other Ambulatory Visit (HOSPITAL_COMMUNITY): Payer: Self-pay | Admitting: Interventional Radiology

## 2021-09-23 DIAGNOSIS — I671 Cerebral aneurysm, nonruptured: Secondary | ICD-10-CM

## 2021-09-28 ENCOUNTER — Telehealth: Payer: Self-pay | Admitting: Student

## 2021-09-28 ENCOUNTER — Telehealth: Payer: Self-pay | Admitting: Internal Medicine

## 2021-09-28 MED ORDER — ATIVAN 2 MG PO TABS: ORAL_TABLET | ORAL | 0 refills | Status: DC

## 2021-09-28 MED ORDER — LORAZEPAM 2 MG PO TABS: ORAL_TABLET | ORAL | 0 refills | Status: DC

## 2021-09-28 NOTE — Telephone Encounter (Signed)
Patient scheduled for MRI/MRA 10/15/21. Patient requesting anti-anxiety medication to tolerate the procedure. Patient has received ativan in the past for similar situation - 2 mg ativan, 1 tablet ordered. Instructions are to take 1/2 tablet one hour prior to MRI and to take the other 1/2 tablet 30 minutes prior to MRI if desired effects not achieved.  Soyla Dryer, [place] [phone] 09/28/2021, 4:01 PM

## 2021-10-15 ENCOUNTER — Other Ambulatory Visit: Payer: Self-pay

## 2021-10-15 ENCOUNTER — Ambulatory Visit (HOSPITAL_COMMUNITY): Payer: Medicare Other

## 2021-10-15 ENCOUNTER — Ambulatory Visit (HOSPITAL_COMMUNITY)
Admission: RE | Admit: 2021-10-15 | Discharge: 2021-10-15 | Disposition: A | Discharge status: Home / Self Care | Payer: Medicare Other | Source: Ambulatory Visit | Attending: Interventional Radiology | Admitting: Interventional Radiology

## 2021-10-15 DIAGNOSIS — I671 Cerebral aneurysm, nonruptured: Secondary | ICD-10-CM | POA: Diagnosis present

## 2021-10-19 ENCOUNTER — Telehealth (HOSPITAL_COMMUNITY): Payer: Self-pay

## 2021-10-19 NOTE — Telephone Encounter (Signed)
Pt agreed to f/u in 1 year with mri/mra. AW

## 2022-05-28 NOTE — Progress Notes (Signed)
error 

## 2022-09-02 LAB — HM MAMMOGRAPHY

## 2022-09-03 ENCOUNTER — Encounter: Payer: Self-pay | Admitting: Internal Medicine

## 2022-09-03 ENCOUNTER — Ambulatory Visit (INDEPENDENT_AMBULATORY_CARE_PROVIDER_SITE_OTHER): Payer: Medicare Other | Admitting: Internal Medicine

## 2022-09-03 VITALS — BP 111/71 | HR 74 | Ht 63.0 in | Wt 114.2 lb

## 2022-09-03 DIAGNOSIS — I671 Cerebral aneurysm, nonruptured: Secondary | ICD-10-CM

## 2022-09-03 DIAGNOSIS — S62655A Nondisplaced fracture of medial phalanx of left ring finger, initial encounter for closed fracture: Secondary | ICD-10-CM

## 2022-09-03 DIAGNOSIS — R058 Other specified cough: Secondary | ICD-10-CM

## 2022-09-03 DIAGNOSIS — Z2821 Immunization not carried out because of patient refusal: Secondary | ICD-10-CM | POA: Diagnosis not present

## 2022-09-03 DIAGNOSIS — S62629A Displaced fracture of medial phalanx of unspecified finger, initial encounter for closed fracture: Secondary | ICD-10-CM

## 2022-09-03 DIAGNOSIS — K5000 Crohn's disease of small intestine without complications: Secondary | ICD-10-CM

## 2022-09-03 DIAGNOSIS — Z7689 Persons encountering health services in other specified circumstances: Secondary | ICD-10-CM

## 2022-09-03 DIAGNOSIS — G4700 Insomnia, unspecified: Secondary | ICD-10-CM

## 2022-09-03 NOTE — Progress Notes (Addendum)
New Patient Office Visit  Subjective    Patient ID: Stacy Hansen, female    DOB: 1951-10-02  Age: 71 y.o. MRN: [id number]  CC:  Chief Complaint  Patient presents with   Establish Care   HPI Stacy Hansen presents to establish care.  She is a 70 year old woman with a past medical history significant for brain aneurysm with prior CVA, Crohn's disease, current tobacco use, insomnia with chronic benzodiazepine use, osteopenia, generalized anxiety disorder, GERD, dyslipidemia, and vitamin D deficiency.  She was most recently followed by [facility].  Stacy Hansen acute concern is a persistent cough with sputum production.  She states that she has tried 2 rounds of antibiotics without significant symptomatic improvement.  She has prescriptions for Tessalon Perles and a prednisone taper but does not fill the prescriptions yet.  She would like my opinion on future treatment.  She additionally reports recently fracturing a bone bone in the ring finger of her left hand.  She is currently wearing a splint and would like to know next steps for treatment.  Acute concerns, chronic medical conditions, and outstanding preventative healthcare maintenance items discussed today are individually addressed in A/P below.  Outpatient Encounter Medications as of 09/03/2022  Medication Sig   acetaminophen (TYLENOL) 500 MG tablet Take 500 mg by mouth every 6 (six) hours as needed for moderate pain or headache.    albuterol (PROVENTIL HFA;VENTOLIN HFA) 108 (90 BASE) MCG/ACT inhaler Inhale 2 puffs into the lungs every 6 (six) hours as needed for wheezing or shortness of breath.   ASPIRIN LOW DOSE 81 MG tablet Take 81 mg by mouth daily.   Cholecalciferol (VITAMIN D3) 1000 units CAPS Take 1,000 Units by mouth.    guaiFENesin-dextromethorphan (ROBITUSSIN DM) 100-10 MG/5ML syrup Take 5 mLs by mouth every 4 (four) hours as needed for cough.   LORazepam (ATIVAN) 0.5 MG tablet Take 0.5 mg by  mouth at bedtime.   Polyethylene Glycol 400 (BLINK TEARS) 0.25 % GEL Apply to eye.   vitamin B-12 (CYANOCOBALAMIN) 1000 MCG tablet Take 1,000 mcg by mouth daily.   [DISCONTINUED] aspirin 325 MG tablet Take 1 tablet (325 mg total) by mouth daily.   [DISCONTINUED] carboxymethylcellulose (REFRESH PLUS) 0.5 % SOLN Place 1 drop into both eyes 3 (three) times daily as needed (for dry/itching eyes).    [DISCONTINUED] cycloSPORINE (RESTASIS) 0.05 % ophthalmic emulsion Place 1 drop into both eyes 3 (three) times daily.   [DISCONTINUED] LORazepam (ATIVAN) 2 MG tablet Take half a tablet by mouth 1 hour prior to imaging scan on 10/15/21. Take an additional half a tablet 30 minutes later if desired effects not achieved.   [DISCONTINUED] traZODone (DESYREL) 50 MG tablet Take 1 tablet (50 mg total) by mouth at bedtime as needed for sleep.   No facility-administered encounter medications on file as of 09/03/2022.    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   Hyperplastic colon polyp    Panic attacks    Stroke [facility])    TIA (transient ischemic attack)    Tobacco abuse     Past Surgical History:  Procedure Laterality Date   CATARACT EXTRACTION Bilateral    EYE SURGERY     IR GENERIC HISTORICAL  07/28/2016   IR RADIOLOGIST EVAL & MGMT 07/28/2016 [facility]   KNEE ARTHROSCOPY WITH MEDIAL MENISECTOMY Left 12/01/2017   Procedure: LEFT KNEE ARTHROSCOPY WITH PARTIAL MEDIAL AND LATERAL MENISECTOMY;  Surgeon: Carole Civil, MD;  Location: [facility];  Service: Orthopedics;  Laterality: Left;   ORIF ULNAR FRACTURE Left    ROTATOR CUFF REPAIR Left    TONSILLECTOMY     TUBAL LIGATION     VAGINAL HYSTERECTOMY     has her ovaries   VARICOSE VEIN SURGERY Right     Family History  Problem Relation Age of Onset   Diabetes  Mother    Kidney failure Mother    Stroke Father    Colon cancer Brother        in his 78's   Diabetes Brother    Diabetes Sister    Stroke Paternal Grandfather    Other Sister        perforated bowel with colostomy   Cancer Brother        Eye    Social History   Socioeconomic History   Marital status: Married    Spouse name: Stacy Hansen    Number of children: 2   Years of education: Not on file   Highest education level: GED or equivalent  Occupational History   Occupation: PCA    Employer: OTHER    Comment: Art gallery manager  Tobacco Use   Smoking status: Every Day    Packs/day: 0.50    Years: 20.00    Total pack years: 10.00    Types: Cigarettes   Smokeless tobacco: Never  Vaping Use   Vaping Use: Former  Substance and Sexual Activity   Alcohol use: No    Alcohol/week: 0.0 standard drinks of alcohol   Drug use: No   Sexual activity: Not Currently    Birth control/protection: None  Other Topics Concern   Not on file  Social History Narrative   Not on file   Social Determinants of Health   Financial Resource Strain: Low Risk  (05/31/2019)   Overall Financial Resource Strain (CARDIA)    Difficulty of Paying Living Expenses: Not hard at all  Food Insecurity: No Food Insecurity (05/31/2019)   Hunger Vital Sign    Worried About Running Out of Food in the Last Year: Never true    [place] in the Last Year: Never true  Transportation Needs: No Transportation Needs (05/31/2019)   PRAPARE - Hydrologist (Medical): No    Lack of Transportation (Non-Medical): No  Physical Activity: Unknown (05/31/2019)   Exercise Vital Sign    Days of Exercise per Week: 0 days    Minutes of Exercise per Session: Not on file  Stress: Stress Concern Present (05/31/2019)   [place]    Feeling of Stress : To some extent  Social Connections: Moderately Integrated (05/31/2019)   Social Connection  and Isolation Panel [NHANES]    Frequency of Communication with Friends and Family: More than three times a week    Frequency of Social Gatherings with Friends and Family: Twice a week    Attends Religious Services: More than 4 times per year    Active Member of [employer] or Organizations: No    Attends Archivist Meetings: Never    Marital Status: Married  Human resources officer Violence: Not At Risk (05/31/2019)   Humiliation, Afraid, Rape, and Kick questionnaire    Fear of Current  or Ex-Partner: No    Emotionally Abused: No    Physically Abused: No    Sexually Abused: No   Review of Systems  Constitutional:  Negative for chills and fever.  HENT:  Negative for sore throat.   Respiratory:  Positive for cough and sputum production. Negative for shortness of breath.   Cardiovascular:  Negative for chest pain, palpitations and leg swelling.  Gastrointestinal:  Negative for abdominal pain, blood in stool, constipation, diarrhea, nausea and vomiting.  Genitourinary:  Negative for dysuria and hematuria.  Musculoskeletal:  Negative for myalgias.       Pain in ring finger of left hand  Skin:  Negative for itching and rash.  Neurological:  Negative for dizziness and headaches.  Psychiatric/Behavioral:  Negative for depression and suicidal ideas.    Objective    BP 111/71   Pulse 74   Ht 5' 3"  (1.6 m)   Wt 114 lb 3.2 oz (51.8 kg)   SpO2 95%   BMI 20.23 kg/m   Physical Exam Constitutional:      General: She is not in acute distress.    Appearance: Normal appearance. She is not toxic-appearing.  HENT:     Head: Normocephalic and atraumatic.     Right Ear: External ear normal.     Left Ear: External ear normal.     Nose: Nose normal. No congestion or rhinorrhea.     Mouth/Throat:     Mouth: Mucous membranes are moist.     Pharynx: Oropharynx is clear. No oropharyngeal exudate or posterior oropharyngeal erythema.  Eyes:     General: No scleral icterus.    Extraocular Movements:  Extraocular movements intact.     Conjunctiva/sclera: Conjunctivae normal.     Pupils: Pupils are equal, round, and reactive to light.  Cardiovascular:     Rate and Rhythm: Normal rate and regular rhythm.     Pulses: Normal pulses.     Heart sounds: Normal heart sounds. No murmur heard.    No friction rub. No gallop.  Pulmonary:     Effort: Pulmonary effort is normal.     Breath sounds: Normal breath sounds. No wheezing, rhonchi or rales.  Abdominal:     General: Abdomen is flat. Bowel sounds are normal. There is no distension.     Palpations: Abdomen is soft.     Tenderness: There is no abdominal tenderness.  Musculoskeletal:        General: Tenderness (Left ring finger) present. No swelling.     Cervical back: Normal range of motion.     Right lower leg: No edema.     Left lower leg: No edema.     Comments: Fourth digit of left hand is in a splint.  There is no obvious swelling or deformity noted.  ROM is limited secondary to pain  Lymphadenopathy:     Cervical: No cervical adenopathy.  Skin:    General: Skin is warm and dry.     Capillary Refill: Capillary refill takes less than 2 seconds.     Coloration: Skin is not jaundiced.  Neurological:     General: No focal deficit present.     Mental Status: She is alert and oriented to person, place, and time.  Psychiatric:        Mood and Affect: Mood normal.        Behavior: Behavior normal.    Assessment & Plan:   Problem List Items Addressed This Visit       Brain aneurysm  History of brain aneurysm.  She is followed by radiology and undergoes surveillance imaging.  No significant changes noted on latest MRI from December 2022.      Closed fracture of middle phalanx of left ring finger    She was evaluated at urgent care earlier this week for pain and swelling in the fourth digit of her left hand.  X-rays were ordered and showed a questionable fracture of the middle phalanx of the fourth digit of the left hand.  She is  currently wearing a splint.  I have placed referral to orthopedic surgery for further management.      Crohn disease ([place])    Not currently prescribed medication for Crohn's disease and not currently followed by GI.  Asymptomatic currently.  She states that she manages her symptoms by watching what she eats. -Basic labs ordered today -Follow-up in 2 weeks to discuss next steps.      Insomnia    She endorses a history of insomnia today and is currently prescribed Ativan 0.5 mg nightly.  She has been prescribed Ativan for many years but today expresses interest in tapering off of Ativan.  I told her that I would not be able to fill any future prescriptions of Ativan.  With this in mind, she will return to care in 2-3 weeks to discuss a tapering plan.      Encounter to establish care    Presenting today to establish care.  Previous medical records and labs reviewed. -Baseline labs ordered today -Outstanding vaccines were declined -Cancer screenings are up-to-date. -Follow-up in 2-3 weeks to review lab results      Return in about 2 weeks (around 09/17/2022).   Johnette Abraham, MD

## 2022-09-03 NOTE — Patient Instructions (Signed)
It was a pleasure to see you today.  Thank you for giving [place] the opportunity to be involved in your care.  Below is a brief recap of your visit and next steps.  We will plan to see you again in 3 weeks.  Summary You have established care today. We will check labs today I have referred you to orthopedic surgery for your finger fracture  Next steps Follow up in 2-3 weeks to discuss plan for tapering Ativan

## 2022-09-04 LAB — CMP14+EGFR
ALT: 12 IU/L (ref 0–32)
AST: 21 IU/L (ref 0–40)
Albumin/Globulin Ratio: 2.5 — ABNORMAL HIGH (ref 1.2–2.2)
Albumin: 4.8 g/dL (ref 3.8–4.8)
Alkaline Phosphatase: 66 IU/L (ref 44–121)
BUN/Creatinine Ratio: 11 — ABNORMAL LOW (ref 12–28)
BUN: 10 mg/dL (ref 8–27)
Bilirubin Total: 0.5 mg/dL (ref 0.0–1.2)
CO2: 19 mmol/L — ABNORMAL LOW (ref 20–29)
Calcium: 9.4 mg/dL (ref 8.7–10.3)
Chloride: 104 mmol/L (ref 96–106)
Creatinine, Ser: 0.92 mg/dL (ref 0.57–1.00)
Globulin, Total: 1.9 g/dL (ref 1.5–4.5)
Glucose: 102 mg/dL — ABNORMAL HIGH (ref 70–99)
Potassium: 5 mmol/L (ref 3.5–5.2)
Sodium: 142 mmol/L (ref 134–144)
Total Protein: 6.7 g/dL (ref 6.0–8.5)
eGFR: 67 mL/min/{1.73_m2} (ref >59)

## 2022-09-04 LAB — TSH+FREE T4
Free T4: 1.56 ng/dL (ref 0.82–1.77)
TSH: 1.69 u[IU]/mL (ref 0.450–4.500)

## 2022-09-04 LAB — CBC WITH DIFFERENTIAL/PLATELET
Basophils Absolute: 0.1 10*3/uL (ref 0.0–0.2)
Basos: 1 %
EOS (ABSOLUTE): 0.1 10*3/uL (ref 0.0–0.4)
Eos: 2 %
Hematocrit: 40.3 % (ref 34.0–46.6)
Hemoglobin: 13.2 g/dL (ref 11.1–15.9)
Immature Grans (Abs): 0 10*3/uL (ref 0.0–0.1)
Immature Granulocytes: 0 %
Lymphocytes Absolute: 2.1 10*3/uL (ref 0.7–3.1)
Lymphs: 36 %
MCH: 30.1 pg (ref 26.6–33.0)
MCHC: 32.8 g/dL (ref 31.5–35.7)
MCV: 92 fL (ref 79–97)
Monocytes Absolute: 0.4 10*3/uL (ref 0.1–0.9)
Monocytes: 7 %
Neutrophils Absolute: 3.1 10*3/uL (ref 1.4–7.0)
Neutrophils: 54 %
Platelets: 204 10*3/uL (ref 150–450)
RBC: 4.38 x10E6/uL (ref 3.77–5.28)
RDW: 12.6 % (ref 11.7–15.4)
WBC: 5.9 10*3/uL (ref 3.4–10.8)

## 2022-09-04 LAB — HEMOGLOBIN A1C
Est. average glucose Bld gHb Est-mCnc: 128 mg/dL
Hgb A1c MFr Bld: 6.1 % — ABNORMAL HIGH (ref 4.8–5.6)

## 2022-09-04 LAB — B12 AND FOLATE PANEL
Folate: 13.3 ng/mL (ref >3.0)
Vitamin B-12: 1294 pg/mL — ABNORMAL HIGH (ref 232–1245)

## 2022-09-04 LAB — LIPID PANEL
Chol/HDL Ratio: 1.9 ratio (ref 0.0–4.4)
Cholesterol, Total: 148 mg/dL (ref 100–199)
HDL: 77 mg/dL (ref >39)
LDL Chol Calc (NIH): 58 mg/dL (ref 0–99)
Triglycerides: 62 mg/dL (ref 0–149)
VLDL Cholesterol Cal: 13 mg/dL (ref 5–40)

## 2022-09-04 LAB — VITAMIN D 25 HYDROXY (VIT D DEFICIENCY, FRACTURES): Vit D, 25-Hydroxy: 53 ng/mL (ref 30.0–100.0)

## 2022-09-13 DIAGNOSIS — Z0001 Encounter for general adult medical examination with abnormal findings: Secondary | ICD-10-CM | POA: Insufficient documentation

## 2022-09-13 DIAGNOSIS — Z7689 Persons encountering health services in other specified circumstances: Secondary | ICD-10-CM | POA: Insufficient documentation

## 2022-09-13 DIAGNOSIS — S62625A Displaced fracture of medial phalanx of left ring finger, initial encounter for closed fracture: Secondary | ICD-10-CM | POA: Insufficient documentation

## 2022-09-13 DIAGNOSIS — R058 Other specified cough: Secondary | ICD-10-CM | POA: Insufficient documentation

## 2022-09-13 NOTE — Assessment & Plan Note (Signed)
She was evaluated at urgent care earlier this week for pain and swelling in the fourth digit of her left hand.  X-rays were ordered and showed a questionable fracture of the middle phalanx of the fourth digit of the left hand.  She is currently wearing a splint.  I have placed referral to orthopedic surgery for further management.

## 2022-09-13 NOTE — Assessment & Plan Note (Signed)
Today she endorses a cough productive of yellow sputum that has been present x4 weeks.  She has been prescribed 2 Z-Pack's and currently has prescriptions for Tessalon Perles and a prednisone taper that she has not filled.  She denies shortness of breath, fever/chills, and nasal/sinus congestion. -Stacy Hansen states that she will fill prescriptions for Tessalon Perles and prednisone and see if this improves her symptoms.  She will follow-up in 2-3 weeks for reassessment.

## 2022-09-13 NOTE — Assessment & Plan Note (Addendum)
Not currently prescribed medication for Crohn's disease and not currently followed by GI.  Asymptomatic currently.  She states that she manages her symptoms by watching what she eats. -Basic labs ordered today -Follow-up in 2 weeks to discuss next steps.

## 2022-09-13 NOTE — Assessment & Plan Note (Signed)
History of brain aneurysm.  She is followed by radiology and undergoes surveillance imaging.  No significant changes noted on latest MRI from December 2022.

## 2022-09-13 NOTE — Assessment & Plan Note (Signed)
She endorses a history of insomnia today and is currently prescribed Ativan 0.5 mg nightly.  She has been prescribed Ativan for many years but today expresses interest in tapering off of Ativan.  I told her that I would not be able to fill any future prescriptions of Ativan.  With this in mind, she will return to care in 2-3 weeks to discuss a tapering plan.

## 2022-09-13 NOTE — Assessment & Plan Note (Signed)
Presenting today to establish care.  Previous medical records and labs reviewed. -Baseline labs ordered today -Outstanding vaccines were declined -Cancer screenings are up-to-date. -Follow-up in 2-3 weeks to review lab results

## 2022-09-17 ENCOUNTER — Encounter: Payer: Self-pay | Admitting: Internal Medicine

## 2022-09-17 ENCOUNTER — Ambulatory Visit (INDEPENDENT_AMBULATORY_CARE_PROVIDER_SITE_OTHER): Payer: Medicare Other | Admitting: Internal Medicine

## 2022-09-17 VITALS — BP 101/61 | HR 83 | Ht 63.0 in | Wt 116.0 lb

## 2022-09-17 DIAGNOSIS — Z0001 Encounter for general adult medical examination with abnormal findings: Secondary | ICD-10-CM | POA: Diagnosis not present

## 2022-09-17 DIAGNOSIS — G4700 Insomnia, unspecified: Secondary | ICD-10-CM

## 2022-09-17 MED ORDER — TRAZODONE HCL 50 MG PO TABS: 50.0000 mg | ORAL_TABLET | Freq: Every evening | ORAL | 0 refills | Status: DC | PRN

## 2022-09-17 NOTE — Assessment & Plan Note (Signed)
She will be due for Medicare AWV next month.  I recommended she receive outstanding shingles and Tdap vaccines at her pharmacy

## 2022-09-17 NOTE — Progress Notes (Signed)
Established Patient Office Visit  Subjective   Patient ID: Stacy Hansen, female    DOB: May 28, 1951  Age: 71 y.o. MRN: [id number]  Chief Complaint  Patient presents with   Follow-up   She is a 71 year old woman with a past medical history significant for brain aneurysm with prior CVA, Crohn's disease, current tobacco use, insomnia with chronic benzodiazepine use, osteopenia, generalized anxiety disorder, GERD, dyslipidemia, and vitamin D deficiency.  She was last seen by me on 10/20 to establish care.  At that time baseline labs were ordered and she expressed an interest in tapering off Ativan.  2-week follow-up was arranged.  There have been no acute interval events.  Today Stacy Hansen states that she feels well.  She has no acute concerns and is asymptomatic.  She has an appointment with orthopedic surgery on 11/9 for middle phalanx fracture of the left ring finger. The respiratory symptoms she previously endorsed have resolved.  Chronic medical conditions and outstanding preventative care items discussed today are individually addressed in A/P below.  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   Hyperplastic colon polyp    Panic attacks    Stroke [facility])    TIA (transient ischemic attack)    Tobacco abuse    Past Surgical History:  Procedure Laterality Date   CATARACT EXTRACTION Bilateral    EYE SURGERY     IR GENERIC HISTORICAL  07/28/2016   IR RADIOLOGIST EVAL & MGMT 07/28/2016 [facility] RAD   KNEE ARTHROSCOPY WITH MEDIAL MENISECTOMY Left 12/01/2017   Procedure: LEFT KNEE ARTHROSCOPY WITH PARTIAL MEDIAL AND LATERAL MENISECTOMY;  Surgeon: Carole Civil, MD;  Location: [facility];  Service: Orthopedics;  Laterality: Left;   ORIF ULNAR FRACTURE Left    ROTATOR CUFF REPAIR Left    TONSILLECTOMY      TUBAL LIGATION     VAGINAL HYSTERECTOMY     has her ovaries   VARICOSE VEIN SURGERY Right    Social History   Tobacco Use   Smoking status: Every Day    Packs/day: 0.50    Years: 20.00    Total pack years: 10.00    Types: Cigarettes   Smokeless tobacco: Never  Vaping Use   Vaping Use: Former  Substance Use Topics   Alcohol use: No    Alcohol/week: 0.0 standard drinks of alcohol   Drug use: No   Family History  Problem Relation Age of Onset   Diabetes Mother    Kidney failure Mother    Stroke Father    Colon cancer Brother        in his 54's   Diabetes Brother    Diabetes Sister    Stroke Paternal Grandfather    Other Sister        perforated bowel with colostomy   Cancer Brother        Eye   Allergies  Allergen Reactions   Dexamethasone Other (See Comments)    halluncinations   Ciprofloxacin Other (See Comments)    Unknown reaction per pt   Meperidine Hcl Nausea And Vomiting and Other (See Comments)    Causes blood pressure to drop when taken with Phenergan through IV   Opium Other (See Comments)    Causes blood  pressure to drop when taken with Phenergan through IV   Promethazine Hcl Nausea And Vomiting and Other (See Comments)    Causes blood pressure to drop when taken with Demeral via IV   Propoxyphene N-Acetaminophen Other (See Comments)    "pass out"   Review of Systems  Constitutional:  Negative for chills and fever.  HENT:  Negative for sore throat.   Respiratory:  Negative for cough and shortness of breath.   Cardiovascular:  Negative for chest pain, palpitations and leg swelling.  Gastrointestinal:  Negative for abdominal pain, blood in stool, constipation, diarrhea, nausea and vomiting.  Genitourinary:  Negative for dysuria and hematuria.  Musculoskeletal:  Negative for myalgias.  Skin:  Negative for itching and rash.  Neurological:  Negative for dizziness and headaches.  Psychiatric/Behavioral:  Negative for depression and suicidal ideas.        Objective:     BP 101/61   Pulse 83   Ht _0  (1.6 m)   Wt 116 lb (52.6 kg)   SpO2 94%   BMI 20.55 kg/m  BP Readings from Last 3 Encounters:  09/17/22 101/61  09/03/22 111/71  08/16/19 (!) 106/59      Physical Exam Constitutional:      General: She is not in acute distress.    Appearance: Normal appearance. She is not toxic-appearing.  HENT:     Head: Normocephalic and atraumatic.     Right Ear: External ear normal.     Left Ear: External ear normal.     Nose: Nose normal. No congestion or rhinorrhea.     Mouth/Throat:     Mouth: Mucous membranes are moist.     Pharynx: Oropharynx is clear. No oropharyngeal exudate or posterior oropharyngeal erythema.  Eyes:     General: No scleral icterus.    Extraocular Movements: Extraocular movements intact.     Conjunctiva/sclera: Conjunctivae normal.     Pupils: Pupils are equal, round, and reactive to light.  Cardiovascular:     Rate and Rhythm: Normal rate and regular rhythm.     Pulses: Normal pulses.     Heart sounds: Normal heart sounds. No murmur heard.    No friction rub. No gallop.  Pulmonary:     Effort: Pulmonary effort is normal.     Breath sounds: Normal breath sounds. No wheezing, rhonchi or rales.  Abdominal:     General: Abdomen is flat. Bowel sounds are normal. There is no distension.     Palpations: Abdomen is soft.     Tenderness: There is no abdominal tenderness.  Musculoskeletal:        General: No swelling.     Cervical back: Normal range of motion.     Right lower leg: No edema.     Left lower leg: No edema.  Lymphadenopathy:     Cervical: No cervical adenopathy.  Skin:    General: Skin is warm and dry.     Capillary Refill: Capillary refill takes less than 2 seconds.     Coloration: Skin is not jaundiced.  Neurological:     General: No focal deficit present.     Mental Status: She is alert and oriented to person, place, and time.  Psychiatric:        Mood and Affect: Mood normal.         Behavior: Behavior normal.    Last CBC Lab Results  Component Value Date   WBC 5.9 09/03/2022   HGB 13.2 09/03/2022   HCT 40.3 09/03/2022  MCV 92 09/03/2022   MCH 30.1 09/03/2022   RDW 12.6 09/03/2022   PLT 204 93/81/0175   Last metabolic panel Lab Results  Component Value Date   GLUCOSE 102 (H) 09/03/2022   NA 142 09/03/2022   K 5.0 09/03/2022   CL 104 09/03/2022   CO2 19 (L) 09/03/2022   BUN 10 09/03/2022   CREATININE 0.92 09/03/2022   EGFR 67 09/03/2022   CALCIUM 9.4 09/03/2022   PROT 6.7 09/03/2022   ALBUMIN 4.8 09/03/2022   LABGLOB 1.9 09/03/2022   AGRATIO 2.5 (H) 09/03/2022   BILITOT 0.5 09/03/2022   ALKPHOS 66 09/03/2022   AST 21 09/03/2022   ALT 12 09/03/2022   ANIONGAP 7 10/27/2018   Last lipids Lab Results  Component Value Date   CHOL 148 09/03/2022   HDL 77 09/03/2022   LDLCALC 58 09/03/2022   TRIG 62 09/03/2022   CHOLHDL 1.9 09/03/2022   Last hemoglobin A1c Lab Results  Component Value Date   HGBA1C 6.1 (H) 09/03/2022   Last thyroid functions Lab Results  Component Value Date   TSH 1.690 09/03/2022   Last vitamin D Lab Results  Component Value Date   VD25OH 53.0 09/03/2022   Last vitamin B12 and Folate Lab Results  Component Value Date   VITAMINB12 1,294 (H) 09/03/2022   FOLATE 13.3 09/03/2022   The 10-year ASCVD risk score (Arnett DK, et al., 2019) is: 9.4%    Assessment & Plan:   Problem List Items Addressed This Visit       Insomnia - Primary    She returns to care today to discuss insomnia.  She has previously expressed an interest in tapering off of Ativan.  I have recommended taking Ativan 0.25 mg nightly x4 weeks and she will concomitantly start trazodone 50 mg nightly.  After 4 weeks she will stop Ativan. -Follow-up in 4 weeks for reassessment      Encounter for general adult medical examination with abnormal findings    She will be due for Medicare AWV next month.  I recommended she receive outstanding shingles  and Tdap vaccines at her pharmacy       Return in about 4 weeks (around 10/15/2022).    Johnette Abraham, MD

## 2022-09-17 NOTE — Patient Instructions (Signed)
It was a pleasure to see you today.  Thank you for giving [place] the opportunity to be involved in your care.  Below is a brief recap of your visit and next steps.  We will plan to see you again in 4 weeks.  Summary Start taking 1/2 tablet of Ativan at night and I have prescribed trazodone 50 mg nightly We will follow up in 4 weeks

## 2022-09-17 NOTE — Assessment & Plan Note (Signed)
She returns to care today to discuss insomnia.  She has previously expressed an interest in tapering off of Ativan.  I have recommended taking Ativan 0.25 mg nightly x4 weeks and she will concomitantly start trazodone 50 mg nightly.  After 4 weeks she will stop Ativan. -Follow-up in 4 weeks for reassessment

## 2022-09-23 ENCOUNTER — Encounter: Payer: Self-pay | Admitting: Orthopedic Surgery

## 2022-09-23 ENCOUNTER — Ambulatory Visit (INDEPENDENT_AMBULATORY_CARE_PROVIDER_SITE_OTHER): Payer: Medicare Other | Admitting: Orthopedic Surgery

## 2022-09-23 VITALS — BP 124/78 | HR 80 | Ht 63.0 in | Wt 115.0 lb

## 2022-09-23 DIAGNOSIS — S6991XA Unspecified injury of right wrist, hand and finger(s), initial encounter: Secondary | ICD-10-CM

## 2022-09-23 NOTE — Progress Notes (Signed)
New problem   New patient last seen April 2019  Chief complaint  Chief Complaint  Patient presents with   Hand Injury    Left ring     History the patient injured her finger about 4 5 weeks ago was seen at urgent care for evaluation was thought to have her fracture she says she wore the splint for 4 to 5 weeks and came in to make sure she followed up she is not having any problems with the hand  Review of systems related to the hand are negative  BP 124/78   Pulse 80   Ht 5' 3"  (1.6 m)   Wt 115 lb (52.2 kg)   BMI 20.37 kg/m   Examination of the left ring finger shows full range of motion all tendon function is normal she is neurovascular intact there is no deformity  I looked at an x-ray from the PACS system there is no evidence of fracture dislocation  The image report indicates a possible fracture at the proximal aspect of the middle phalanx  I do not see any injury residual here and I released the patient to normal activity

## 2022-10-15 ENCOUNTER — Telehealth: Payer: Medicare Other | Admitting: Internal Medicine

## 2022-10-20 ENCOUNTER — Encounter (HOSPITAL_COMMUNITY): Payer: Self-pay | Admitting: *Deleted

## 2022-10-20 ENCOUNTER — Emergency Department (HOSPITAL_COMMUNITY)
Admission: EM | Admit: 2022-10-20 | Discharge: 2022-10-20 | Disposition: A | Discharge status: Home / Self Care | Payer: Medicare Other | Attending: Emergency Medicine | Admitting: Emergency Medicine

## 2022-10-20 ENCOUNTER — Emergency Department (HOSPITAL_COMMUNITY): Payer: Medicare Other

## 2022-10-20 ENCOUNTER — Other Ambulatory Visit: Payer: Self-pay

## 2022-10-20 DIAGNOSIS — Z8673 Personal history of transient ischemic attack (TIA), and cerebral infarction without residual deficits: Secondary | ICD-10-CM | POA: Insufficient documentation

## 2022-10-20 DIAGNOSIS — S0990XA Unspecified injury of head, initial encounter: Secondary | ICD-10-CM | POA: Insufficient documentation

## 2022-10-20 DIAGNOSIS — R55 Syncope and collapse: Secondary | ICD-10-CM | POA: Insufficient documentation

## 2022-10-20 DIAGNOSIS — U071 COVID-19: Secondary | ICD-10-CM | POA: Insufficient documentation

## 2022-10-20 DIAGNOSIS — X58XXXA Exposure to other specified factors, initial encounter: Secondary | ICD-10-CM | POA: Insufficient documentation

## 2022-10-20 LAB — CBC WITH DIFFERENTIAL/PLATELET
Abs Immature Granulocytes: 0.01 10*3/uL (ref 0.00–0.07)
Basophils Absolute: 0 10*3/uL (ref 0.0–0.1)
Basophils Relative: 1 %
Eosinophils Absolute: 0 10*3/uL (ref 0.0–0.5)
Eosinophils Relative: 0 %
HCT: 45 % (ref 36.0–46.0)
Hemoglobin: 14.5 g/dL (ref 12.0–15.0)
Immature Granulocytes: 0 %
Lymphocytes Relative: 47 %
Lymphs Abs: 2 10*3/uL (ref 0.7–4.0)
MCH: 30.3 pg (ref 26.0–34.0)
MCHC: 32.2 g/dL (ref 30.0–36.0)
MCV: 93.9 fL (ref 80.0–100.0)
Monocytes Absolute: 0.4 10*3/uL (ref 0.1–1.0)
Monocytes Relative: 10 %
Neutro Abs: 1.7 10*3/uL (ref 1.7–7.7)
Neutrophils Relative %: 42 %
Platelets: 164 10*3/uL (ref 150–400)
RBC: 4.79 MIL/uL (ref 3.87–5.11)
RDW: 13.6 % (ref 11.5–15.5)
WBC: 4.1 10*3/uL (ref 4.0–10.5)
nRBC: 0 % (ref 0.0–0.2)

## 2022-10-20 LAB — BASIC METABOLIC PANEL
Anion gap: 9 (ref 5–15)
BUN: 10 mg/dL (ref 8–23)
CO2: 27 mmol/L (ref 22–32)
Calcium: 8.9 mg/dL (ref 8.9–10.3)
Chloride: 99 mmol/L (ref 98–111)
Creatinine, Ser: 0.77 mg/dL (ref 0.44–1.00)
GFR, Estimated: >60 mL/min (ref >60)
Glucose, Bld: 93 mg/dL (ref 70–99)
Potassium: 4 mmol/L (ref 3.5–5.1)
Sodium: 135 mmol/L (ref 135–145)

## 2022-10-20 LAB — URINALYSIS, ROUTINE W REFLEX MICROSCOPIC
Bacteria, UA: NONE SEEN
Bilirubin Urine: NEGATIVE
Glucose, UA: NEGATIVE mg/dL
Ketones, ur: NEGATIVE mg/dL
Leukocytes,Ua: NEGATIVE
Nitrite: NEGATIVE
Protein, ur: NEGATIVE mg/dL
Specific Gravity, Urine: 1.004 — ABNORMAL LOW (ref 1.005–1.030)
pH: 6 (ref 5.0–8.0)

## 2022-10-20 MED ORDER — SODIUM CHLORIDE 0.9 % IV BOLUS
1000.0000 mL | Freq: Once | INTRAVENOUS | Status: AC
  Administered 2022-10-20: 1000 mL via INTRAVENOUS

## 2022-10-20 MED ORDER — ONDANSETRON HCL 4 MG/2ML IJ SOLN
4.0000 mg | Freq: Once | INTRAMUSCULAR | Status: AC
  Administered 2022-10-20: 4 mg via INTRAVENOUS
  Filled 2022-10-20: qty 2

## 2022-10-20 NOTE — ED Triage Notes (Signed)
Pt states she was diagnosed with covid on Monday and yesterday she started having some n/v and passed out and fell. Pt hit the back of her head and a cross her shoulders

## 2022-10-20 NOTE — Discharge Instructions (Signed)
Your lab tests and exam today are reassuring, however I do want you to rest and make sure you are drinking plenty of fluids.  Use caution when standing for long periods of time, make sure you are resting frequently as you recover from this COVID infection.  Plan to see your doctor for recheck towards the end of the week, otherwise return here for any worsening symptoms.  As long as you are feeling better it will be okay for you to return to work next Monday per your employer's direction.  As discussed your lab test and your imaging tests are stable today with no sign of injury from today's fall.

## 2022-10-20 NOTE — ED Provider Notes (Signed)
[facility] EMERGENCY DEPARTMENT Provider Note   CSN: [id number] Arrival date & time: 10/20/22  0957     History  Chief Complaint  Patient presents with   Loss of Consciousness    Stacy Hansen is a 71 y.o. female with a history including CVA, Crohn's disease, history of brain aneurysm and GERD, was diagnosed with COVID-19 2 days ago by her PCP the currently, although her symptoms started 5 days ago.  Her husband was diagnosed with COVID-19 last week, he is currently improving, however her symptoms persist, she describes generalized weakness, nonproductive cough and has had nausea vomiting and diarrhea through yesterday although the nausea and diarrhea have resolved since yesterday.  She states she has been busy carrying for her husband and has not been taking care of herself as she should. She presents today secondary to injuries sustained when she passed out prior to arrival.  She was walking in her kitchen, felt lightheaded, passed out and fell backward hitting her head on a kitchen rack.  She does have a headache along with tenderness of her posterior scalp and neck as well.  She also has pain across her left upper back and her left hip.  She was ambulatory after the event.  She has had no nausea or vomiting since arrival.  She denies shortness of breath or chest pain, had no palpitations at the time of this event.  She actually feels like her COVID symptoms are starting to improving but continues to feel weak.  She is mostly concerned about possible injury sustained in today's fall. She denies chest pain, sob, palpitations.    The history is provided by the patient.       Home Medications Prior to Admission medications   Medication Sig Start Date End Date Taking? Authorizing Provider  acetaminophen (TYLENOL) 500 MG tablet Take 500 mg by mouth every 6 (six) hours as needed for moderate pain or headache.     [provider]  albuterol (PROVENTIL HFA;VENTOLIN HFA) 108 (90 BASE)  MCG/ACT inhaler Inhale 2 puffs into the lungs every 6 (six) hours as needed for wheezing or shortness of breath.    [provider]  ASPIRIN LOW DOSE 81 MG tablet Take 81 mg by mouth daily. 06/28/22   [provider]  Cholecalciferol (VITAMIN D3) 1000 units CAPS Take 1,000 Units by mouth.     [provider]  guaiFENesin-dextromethorphan (ROBITUSSIN DM) 100-10 MG/5ML syrup Take 5 mLs by mouth every 4 (four) hours as needed for cough.    [provider]  LORazepam (ATIVAN) 0.5 MG tablet Take 0.5 mg by mouth at bedtime.    [provider]  Polyethylene Glycol 400 (BLINK TEARS) 0.25 % GEL Apply to eye.    [provider]  traZODone (DESYREL) 50 MG tablet Take 1 tablet (50 mg total) by mouth at bedtime as needed for up to 30 doses for sleep. 09/17/22   Johnette Abraham, MD  vitamin B-12 (CYANOCOBALAMIN) 1000 MCG tablet Take 1,000 mcg by mouth daily.    [provider]      Allergies    Dexamethasone, Ciprofloxacin, Meperidine hcl, Opium, Promethazine hcl, and Propoxyphene n-acetaminophen    Review of Systems   Review of Systems  Constitutional:  Positive for fatigue. Negative for chills and fever.  HENT:  Negative for congestion and sore throat.   Eyes: Negative.   Respiratory:  Positive for cough. Negative for chest tightness and shortness of breath.   Cardiovascular:  Negative for  chest pain, palpitations and leg swelling.  Gastrointestinal:  Positive for diarrhea, nausea and vomiting. Negative for abdominal pain.  Genitourinary: Negative.   Musculoskeletal:  Positive for myalgias. Negative for arthralgias, joint swelling and neck pain.  Skin: Negative.  Negative for rash and wound.  Neurological:  Positive for syncope and weakness. Negative for dizziness, light-headedness, numbness and headaches.  Psychiatric/Behavioral: Negative.    All other systems reviewed and are negative.   Physical Exam Updated Vital Signs BP (!) 113/57  (BP Location: Right Arm)   Pulse 73   Temp 97.6 F (36.4 C) (Oral)   Resp 16   Ht 5' 3"  (1.6 m)   Wt 49.9 kg   SpO2 98%   BMI 19.49 kg/m  Physical Exam Vitals and nursing note reviewed.  Constitutional:      Appearance: She is well-developed.  HENT:     Head: Normocephalic and atraumatic.     Comments: No visible face or head trauma.  She does have some tenderness palpation at her occipital scalp.  No hematoma.  She also has some midline tenderness palpation along her cervical spine without palpable deformity.    Mouth/Throat:     Mouth: Mucous membranes are dry.  Eyes:     Conjunctiva/sclera: Conjunctivae normal.  Cardiovascular:     Rate and Rhythm: Normal rate and regular rhythm.     Heart sounds: Normal heart sounds.  Pulmonary:     Effort: Pulmonary effort is normal.     Breath sounds: Normal breath sounds. No wheezing.  Abdominal:     General: Bowel sounds are normal.     Palpations: Abdomen is soft.     Tenderness: There is no abdominal tenderness. There is no guarding.  Musculoskeletal:        General: Normal range of motion.     Cervical back: Normal range of motion. Bony tenderness present. No deformity or rigidity. Pain with movement present.     Thoracic back: Normal.     Lumbar back: Normal.     Left hip: Bony tenderness present. No deformity.       Legs:  Skin:    General: Skin is warm and dry.  Neurological:     General: No focal deficit present.     Mental Status: She is alert and oriented to person, place, and time.     ED Results / Procedures / Treatments   Labs (all labs ordered are listed, but only abnormal results are displayed) Labs Reviewed  URINALYSIS, ROUTINE W REFLEX MICROSCOPIC - Abnormal; Notable for the following components:      Result Value   Color, Urine STRAW (*)    Specific Gravity, Urine 1.004 (*)    Hgb urine dipstick SMALL (*)    All other components within normal limits  CBC WITH DIFFERENTIAL/PLATELET  BASIC METABOLIC  PANEL    EKG None  Radiology DG Ribs Unilateral W/Chest Left  Result Date: 10/20/2022 CLINICAL DATA:  Status post fall.  Complains of left-sided pain. EXAM: LEFT RIBS AND CHEST - 3+ VIEW COMPARISON:  02/19/2014 FINDINGS: Heart size and mediastinal contours appear normal. No pleural effusion or edema. No airspace opacities. No displaced rib fractures. The visualized osseous structures appear intact. IMPRESSION: 1. No displaced rib fractures. 2. No acute cardiopulmonary abnormalities. Electronically Signed   By: Kerby Moors M.D.   On: 10/20/2022 13:10   DG HIPS BILAT WITH PELVIS MIN 5 VIEWS  Result Date: 10/20/2022 CLINICAL DATA:  Fall EXAM: DG HIP (WITH OR WITHOUT PELVIS  2V BILAT COMPARISON:  None Available. FINDINGS: There is no evidence of hip fracture or dislocation. There is no evidence of arthropathy or other focal bone abnormality. Sacrum is incompletely assessed due to overlying bowel gas. IMPRESSION: No radiographic evidence of a hip fracture. Electronically Signed   By: Marin Roberts M.D.   On: 10/20/2022 13:09   CT Head Wo Contrast  Result Date: 10/20/2022 CLINICAL DATA:  Head trauma. Status post fall hitting back of head. History of aneurysm. EXAM: CT HEAD WITHOUT CONTRAST CT CERVICAL SPINE WITHOUT CONTRAST TECHNIQUE: Multidetector CT imaging of the head and cervical spine was performed following the standard protocol without intravenous contrast. Multiplanar CT image reconstructions of the cervical spine were also generated. RADIATION DOSE REDUCTION: This exam was performed according to the departmental dose-optimization program which includes automated exposure control, adjustment of the mA and/or kV according to patient size and/or use of iterative reconstruction technique. COMPARISON:  MRI 10/15/2021 FINDINGS: CT HEAD FINDINGS Brain: No evidence of acute infarction, hemorrhage, hydrocephalus, extra-axial collection or mass lesion/mass effect. Vascular: No hyperdense vessel or  unexpected calcification. Skull: Normal. Negative for fracture or focal lesion. Sinuses/Orbits: No acute finding. Other: None CT CERVICAL SPINE FINDINGS Alignment: No posttraumatic malalignment of the cervical spine. Skull base and vertebrae: No acute fracture. No primary bone lesion or focal pathologic process. Soft tissues and spinal canal: No prevertebral fluid or swelling. No visible canal hematoma. Disc levels: Disc space narrowing and endplate spurring is noted at C4-5. Upper chest: Biapical pleuroparenchymal scarring noted. Other: None IMPRESSION: 1. No acute intracranial abnormality. 2. No evidence for cervical spine fracture or subluxation. 3. Cervical degenerative disc disease. Electronically Signed   By: Kerby Moors M.D.   On: 10/20/2022 12:25   CT Cervical Spine Wo Contrast  Result Date: 10/20/2022 CLINICAL DATA:  Head trauma. Status post fall hitting back of head. History of aneurysm. EXAM: CT HEAD WITHOUT CONTRAST CT CERVICAL SPINE WITHOUT CONTRAST TECHNIQUE: Multidetector CT imaging of the head and cervical spine was performed following the standard protocol without intravenous contrast. Multiplanar CT image reconstructions of the cervical spine were also generated. RADIATION DOSE REDUCTION: This exam was performed according to the departmental dose-optimization program which includes automated exposure control, adjustment of the mA and/or kV according to patient size and/or use of iterative reconstruction technique. COMPARISON:  MRI 10/15/2021 FINDINGS: CT HEAD FINDINGS Brain: No evidence of acute infarction, hemorrhage, hydrocephalus, extra-axial collection or mass lesion/mass effect. Vascular: No hyperdense vessel or unexpected calcification. Skull: Normal. Negative for fracture or focal lesion. Sinuses/Orbits: No acute finding. Other: None CT CERVICAL SPINE FINDINGS Alignment: No posttraumatic malalignment of the cervical spine. Skull base and vertebrae: No acute fracture. No primary bone  lesion or focal pathologic process. Soft tissues and spinal canal: No prevertebral fluid or swelling. No visible canal hematoma. Disc levels: Disc space narrowing and endplate spurring is noted at C4-5. Upper chest: Biapical pleuroparenchymal scarring noted. Other: None IMPRESSION: 1. No acute intracranial abnormality. 2. No evidence for cervical spine fracture or subluxation. 3. Cervical degenerative disc disease. Electronically Signed   By: Kerby Moors M.D.   On: 10/20/2022 12:25    Procedures Procedures    Medications Ordered in ED Medications  sodium chloride 0.9 % bolus 1,000 mL (0 mLs Intravenous Stopped 10/20/22 1325)  ondansetron (ZOFRAN) injection 4 mg (4 mg Intravenous Given 10/20/22 1225)  sodium chloride 0.9 % bolus 1,000 mL (0 mLs Intravenous Stopped 10/20/22 1617)    ED Course/ Medical Decision Making/ A&P  Medical Decision Making Patient presenting with a syncopal event prior to arrival, falling backwards, hitting her head with other orthopedic complaints of pain.  She was diagnosed with COVID 2 days ago, symptoms starting 5 days ago.  Has generalized weakness, nausea vomiting and diarrhea 3 yesterday.  Felt lightheaded while walking in her home causing syncope and fall.  Denies chest pain, shortness of breath, focal weakness, palpitations.  Her vital signs have been relatively stable here, although orthostatic vital signs were obtained and her sitting and standing blood pressures were soft.  This was after receiving 1 L of normal saline.  An additional liter of saline was given after which she ambulated in department without symptoms including no weakness, lightheadedness or hypoxia.  We discussed antivirals such as Paxlovid although she is currently on day 5 of her COVID symptoms.  She does feel like the COVID is actually improving and she defers this medication.  She was encouraged to rest, make sure she is drinking plenty of fluids, plan close follow-up  with her primary provider if symptoms persist, return here for any worsening symptoms.  Today syncope and fall predominately secondary to weakness/dehydration.  Cardiac source unlikely, no palpitations, no chest pain, she had symptoms of lightheadedness prior to the event.  No shortness of breath, vital signs have been stable here, doubt PE.  Amount and/or Complexity of Data Reviewed External Data Reviewed: labs.    Details: Confirmed COVID positivity 2 days ago through [employer] health Per care everywhere Labs: ordered.    Details: Labs reviewed and normal. Radiology: ordered and independent interpretation performed.    Details: Reviewed imaging with patient, no acute fractures or dislocations, no intracranial injuries from today's head injury.  Agree with imaging interpretations.           Final Clinical Impression(s) / ED Diagnoses Final diagnoses:  Syncope and collapse  COVID-19  Minor head injury, initial encounter    Rx / DC Orders ED Discharge Orders     None         Landis Martins 10/20/22 Einar Crow    Godfrey Pick, MD 10/23/22 [phone]

## 2022-10-20 NOTE — ED Notes (Signed)
Patient ambulated around unit on room air with o2 stat maintaining at 96-98 %

## 2022-10-21 ENCOUNTER — Telehealth: Payer: Self-pay

## 2022-10-21 NOTE — Telephone Encounter (Signed)
Transition Care Management Unsuccessful Follow-up Telephone Call  Date of discharge and from where:  [facility] 10/20/2022  Attempts:  1st Attempt  Reason for unsuccessful TCM follow-up call:  No answer/busy Juanda Crumble, [place] Direct Dial [phone]

## 2022-11-29 ENCOUNTER — Other Ambulatory Visit (HOSPITAL_COMMUNITY): Payer: Self-pay | Admitting: Interventional Radiology

## 2022-11-29 DIAGNOSIS — I671 Cerebral aneurysm, nonruptured: Secondary | ICD-10-CM

## 2022-12-02 ENCOUNTER — Telehealth (HOSPITAL_COMMUNITY): Payer: Self-pay

## 2022-12-02 NOTE — Telephone Encounter (Signed)
Pt called to find out if I had called in her Lorazepam for her mri on Monday. I told her that I can't call it in but sent a message to our PA. I have sent another message since pt says that she hasn't received a call from the pharmacy yet to pick this up. AB

## 2022-12-03 ENCOUNTER — Other Ambulatory Visit: Payer: Self-pay | Admitting: Student

## 2022-12-03 MED ORDER — LORAZEPAM 2 MG PO TABS: ORAL_TABLET | ORAL | 0 refills | Status: DC

## 2022-12-06 ENCOUNTER — Ambulatory Visit (HOSPITAL_COMMUNITY)
Admission: RE | Admit: 2022-12-06 | Discharge: 2022-12-06 | Disposition: A | Discharge status: Home / Self Care | Payer: Medicare Other | Source: Ambulatory Visit | Attending: Interventional Radiology | Admitting: Interventional Radiology

## 2022-12-06 DIAGNOSIS — I671 Cerebral aneurysm, nonruptured: Secondary | ICD-10-CM | POA: Diagnosis not present

## 2022-12-08 ENCOUNTER — Telehealth (HOSPITAL_COMMUNITY): Payer: Self-pay

## 2022-12-08 NOTE — Telephone Encounter (Signed)
Pt agreed to f/u in 1 year with an mri/mra. AB

## 2022-12-08 NOTE — Telephone Encounter (Signed)
Called pt regarding recent imaging, no answer, left vm. AB  

## 2022-12-14 DIAGNOSIS — H9312 Tinnitus, left ear: Secondary | ICD-10-CM | POA: Insufficient documentation

## 2022-12-18 ENCOUNTER — Emergency Department (HOSPITAL_COMMUNITY): Payer: Medicare Other

## 2022-12-18 ENCOUNTER — Encounter (HOSPITAL_COMMUNITY): Payer: Self-pay | Admitting: *Deleted

## 2022-12-18 ENCOUNTER — Emergency Department (HOSPITAL_COMMUNITY)
Admission: EM | Admit: 2022-12-18 | Discharge: 2022-12-18 | Disposition: A | Discharge status: Home / Self Care | Payer: Medicare Other | Attending: Student | Admitting: Student

## 2022-12-18 ENCOUNTER — Other Ambulatory Visit: Payer: Self-pay

## 2022-12-18 DIAGNOSIS — R519 Headache, unspecified: Secondary | ICD-10-CM | POA: Diagnosis not present

## 2022-12-18 DIAGNOSIS — Z79899 Other long term (current) drug therapy: Secondary | ICD-10-CM | POA: Insufficient documentation

## 2022-12-18 DIAGNOSIS — I639 Cerebral infarction, unspecified: Secondary | ICD-10-CM

## 2022-12-18 DIAGNOSIS — R531 Weakness: Secondary | ICD-10-CM | POA: Insufficient documentation

## 2022-12-18 DIAGNOSIS — R202 Paresthesia of skin: Secondary | ICD-10-CM | POA: Diagnosis not present

## 2022-12-18 DIAGNOSIS — F17210 Nicotine dependence, cigarettes, uncomplicated: Secondary | ICD-10-CM | POA: Insufficient documentation

## 2022-12-18 DIAGNOSIS — Z7982 Long term (current) use of aspirin: Secondary | ICD-10-CM | POA: Diagnosis not present

## 2022-12-18 DIAGNOSIS — R55 Syncope and collapse: Secondary | ICD-10-CM | POA: Insufficient documentation

## 2022-12-18 LAB — URINALYSIS, ROUTINE W REFLEX MICROSCOPIC
Bilirubin Urine: NEGATIVE
Glucose, UA: NEGATIVE mg/dL
Hgb urine dipstick: NEGATIVE
Ketones, ur: NEGATIVE mg/dL
Leukocytes,Ua: NEGATIVE
Nitrite: NEGATIVE
Protein, ur: NEGATIVE mg/dL
Specific Gravity, Urine: 1.011 (ref 1.005–1.030)
pH: 7 (ref 5.0–8.0)

## 2022-12-18 LAB — CBC
HCT: 39.1 % (ref 36.0–46.0)
Hemoglobin: 12.7 g/dL (ref 12.0–15.0)
MCH: 31 pg (ref 26.0–34.0)
MCHC: 32.5 g/dL (ref 30.0–36.0)
MCV: 95.4 fL (ref 80.0–100.0)
Platelets: 195 10*3/uL (ref 150–400)
RBC: 4.1 MIL/uL (ref 3.87–5.11)
RDW: 13.8 % (ref 11.5–15.5)
WBC: 6.8 10*3/uL (ref 4.0–10.5)
nRBC: 0 % (ref 0.0–0.2)

## 2022-12-18 LAB — DIFFERENTIAL
Abs Immature Granulocytes: 0.03 10*3/uL (ref 0.00–0.07)
Basophils Absolute: 0 10*3/uL (ref 0.0–0.1)
Basophils Relative: 0 %
Eosinophils Absolute: 0 10*3/uL (ref 0.0–0.5)
Eosinophils Relative: 0 %
Immature Granulocytes: 0 %
Lymphocytes Relative: 11 %
Lymphs Abs: 0.8 10*3/uL (ref 0.7–4.0)
Monocytes Absolute: 0.1 10*3/uL (ref 0.1–1.0)
Monocytes Relative: 2 %
Neutro Abs: 6 10*3/uL (ref 1.7–7.7)
Neutrophils Relative %: 87 %

## 2022-12-18 LAB — COMPREHENSIVE METABOLIC PANEL
ALT: 13 U/L (ref 0–44)
AST: 25 U/L (ref 15–41)
Albumin: 4.3 g/dL (ref 3.5–5.0)
Alkaline Phosphatase: 57 U/L (ref 38–126)
Anion gap: 10 (ref 5–15)
BUN: 12 mg/dL (ref 8–23)
CO2: 23 mmol/L (ref 22–32)
Calcium: 9.1 mg/dL (ref 8.9–10.3)
Chloride: 105 mmol/L (ref 98–111)
Creatinine, Ser: 0.79 mg/dL (ref 0.44–1.00)
GFR, Estimated: >60 mL/min (ref >60)
Glucose, Bld: 132 mg/dL — ABNORMAL HIGH (ref 70–99)
Potassium: 3.7 mmol/L (ref 3.5–5.1)
Sodium: 138 mmol/L (ref 135–145)
Total Bilirubin: 0.1 mg/dL — ABNORMAL LOW (ref 0.3–1.2)
Total Protein: 7 g/dL (ref 6.5–8.1)

## 2022-12-18 LAB — RAPID URINE DRUG SCREEN, HOSP PERFORMED
Amphetamines: NOT DETECTED
Barbiturates: NOT DETECTED
Benzodiazepines: NOT DETECTED
Cocaine: NOT DETECTED
Opiates: NOT DETECTED
Tetrahydrocannabinol: NOT DETECTED

## 2022-12-18 LAB — PROTIME-INR
INR: 1 (ref 0.8–1.2)
Prothrombin Time: 13.3 s (ref 11.4–15.2)

## 2022-12-18 LAB — TROPONIN I (HIGH SENSITIVITY)
Troponin I (High Sensitivity): 3 ng/L (ref <18)
Troponin I (High Sensitivity): 3 ng/L

## 2022-12-18 LAB — CBG MONITORING, ED: Glucose-Capillary: 137 mg/dL — ABNORMAL HIGH (ref 70–99)

## 2022-12-18 LAB — APTT: aPTT: 27 s (ref 24–36)

## 2022-12-18 LAB — ETHANOL: Alcohol, Ethyl (B): <10 mg/dL

## 2022-12-18 MED ORDER — ASPIRIN 325 MG PO TABS
325.0000 mg | ORAL_TABLET | Freq: Once | ORAL | Status: AC
  Administered 2022-12-18: 325 mg via ORAL
  Filled 2022-12-18: qty 1

## 2022-12-18 MED ORDER — CLOPIDOGREL BISULFATE 75 MG PO TABS
75.0000 mg | ORAL_TABLET | Freq: Every day | ORAL | Status: DC
  Administered 2022-12-18: 75 mg via ORAL
  Filled 2022-12-18: qty 1

## 2022-12-18 MED ORDER — DIPHENHYDRAMINE HCL 50 MG/ML IJ SOLN
25.0000 mg | Freq: Once | INTRAMUSCULAR | Status: AC
  Administered 2022-12-18: 25 mg via INTRAVENOUS
  Filled 2022-12-18: qty 1

## 2022-12-18 MED ORDER — IOHEXOL 350 MG/ML SOLN
75.0000 mL | Freq: Once | INTRAVENOUS | Status: AC | PRN
  Administered 2022-12-18: 75 mL via INTRAVENOUS

## 2022-12-18 MED ORDER — STROKE: EARLY STAGES OF RECOVERY BOOK
Freq: Once | Status: DC
  Filled 2022-12-18: qty 1

## 2022-12-18 MED ORDER — LACTATED RINGERS IV BOLUS
1000.0000 mL | Freq: Once | INTRAVENOUS | Status: AC
  Administered 2022-12-18: 1000 mL via INTRAVENOUS

## 2022-12-18 MED ORDER — PROCHLORPERAZINE EDISYLATE 10 MG/2ML IJ SOLN
10.0000 mg | Freq: Once | INTRAMUSCULAR | Status: AC
  Administered 2022-12-18: 10 mg via INTRAVENOUS
  Filled 2022-12-18: qty 2

## 2022-12-18 NOTE — ED Notes (Signed)
Patient ambulated to restroom with 1 person assist. 

## 2022-12-18 NOTE — ED Notes (Signed)
Attempted to call the charge nurse at [facility] for transfer report.

## 2022-12-18 NOTE — ED Notes (Signed)
Entered room to give PO med- pt eating [employer] chicken nugget meal with soda and fries brought in by daughter without difficulty. Pt took PO med without difficulty.

## 2022-12-18 NOTE — ED Triage Notes (Signed)
Pt BIB [facility] for near syncope today while at work.  Pt with ear issues and is on Prednisone.  +HA. Trouble hearing. Reported that pt passed out 3 weeks ago and has seen ENT and neuro since.

## 2022-12-18 NOTE — Consult Note (Addendum)
Code stroke activated at 1500. Neuro paged at 1502.  Left for CT at 1504.  Dr Reeves Forth on camera at 1506. Cart in CT with pt.  Returned from CT at 1521. Dr Reeves Forth finished exam and updated daughter at bedside.  mRS 0. LKW 1220.  Off camera at 1545.

## 2022-12-18 NOTE — ED Notes (Signed)
Beeped out CODE STROKE @ 1500.  Called CT and Lab @ 1501.

## 2022-12-18 NOTE — Consult Note (Signed)
NEUROLOGY TELECONSULTATION NOTE   Date of service: December 18, 2022 Patient Name: Stacy Hansen MRN:  [id number] DOB:  02/22/1951 Reason for consult: "headche, vision loss and numbness"  Requesting Provider: Dr. Matilde Sprang. Consult Participants:  anisa stroke coordinator, ER nurse, pt's daughter. Location of the provider: [facility] hospital. Location of the patient: [place]  This consult was provided via telemedicine with 2-way video and audio communication. The patient/family was informed that care would be provided in this way and agreed to receive care in this manner.   _ _ _   _ __   _ __ _ _  __ __   _ __   __ _  History of Present Illness   Stacy Hansen is a 72 y.o. female with PMH significant for  has a past medical history of Anxiety, Brain aneurysm, Crohn disease ([place]), Depression, Fibromyalgia, GERD (gastroesophageal reflux disease), Headache, Hearing loss in right ear, Hyperplastic colon polyp, Panic attacks, Stroke ([place]), TIA (transient ischemic attack), and Tobacco abuse. H/o aneurysm that is stable follow by IR outpt. who presents with vision loss both eyes, dizzy, seeing stars, fainted but did not pass out. Biggest complaint is the headache at the moment 9/10. Gets headaches but not often and does not have weakness with headaches. Symptoms started at 12:20pm today while at work. Pts daughter at bedside.   She had COVID 10/2022: has not felt normal since then.   Premorbid mRS = 0  TNK given: no, low NIHSS 1. Thrombectomy: no. No LVO. 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   Hyperplastic colon polyp    Panic attacks    Stroke [facility])    TIA (transient ischemic attack)     Tobacco abuse    Past Surgical History:  Procedure Laterality Date   CATARACT EXTRACTION Bilateral    EYE SURGERY     IR GENERIC HISTORICAL  07/28/2016   IR RADIOLOGIST EVAL & MGMT 07/28/2016 [facility]-INTER[facility] RAD   KNEE ARTHROSCOPY WITH MEDIAL MENISECTOMY Left 12/01/2017   Procedure: LEFT KNEE ARTHROSCOPY WITH PARTIAL MEDIAL AND LATERAL MENISECTOMY;  Surgeon: Carole Civil, MD;  Location: [facility];  Service: Orthopedics;  Laterality: Left;   ORIF ULNAR FRACTURE Left    ROTATOR CUFF REPAIR Left    TONSILLECTOMY     TUBAL LIGATION     VAGINAL HYSTERECTOMY     has her ovaries   VARICOSE VEIN SURGERY Right    Family History  Problem Relation Age of Onset   Diabetes Mother    Kidney failure Mother    Stroke Father    Colon cancer Brother        in his 88's   Diabetes Brother    Diabetes Sister    Stroke Paternal Grandfather    Other Sister        perforated bowel with colostomy   Cancer Brother        Eye   Social History   Socioeconomic History   Marital status: Married    Spouse name: Denyse Amass    Number of children: 2   Years of education: Not on  file   Highest education level: GED or equivalent  Occupational History   Occupation: PCA    Employer: OTHER    Comment: [employer]  Tobacco Use   Smoking status: Every Day    Packs/day: 0.50    Years: 20.00    Total pack years: 10.00    Types: Cigarettes   Smokeless tobacco: Never  Vaping Use   Vaping Use: Former  Substance and Sexual Activity   Alcohol use: No    Alcohol/week: 0.0 standard drinks of alcohol   Drug use: No   Sexual activity: Not Currently    Birth control/protection: None  Other Topics Concern   Not on file  Social History Narrative   Not on file   Social Determinants of Health   Financial Resource Strain: Low Risk  (05/31/2019)   Overall Financial Resource Strain (CARDIA)    Difficulty of Paying Living Expenses: Not hard at all  Food Insecurity: No Food Insecurity (05/31/2019)   Hunger Vital Sign     Worried About Running Out of Food in the Last Year: Never true    Ran Out of Food in the Last Year: Never true  Transportation Needs: No Transportation Needs (05/31/2019)   PRAPARE - Hydrologist (Medical): No    Lack of Transportation (Non-Medical): No  Physical Activity: Unknown (05/31/2019)   Exercise Vital Sign    Days of Exercise per Week: 0 days    Minutes of Exercise per Session: Not on file  Stress: Stress Concern Present (05/31/2019)   [place]    Feeling of Stress : To some extent  Social Connections: Moderately Integrated (05/31/2019)   Social Connection and Isolation Panel [NHANES]    Frequency of Communication with Friends and Family: More than three times a week    Frequency of Social Gatherings with Friends and Family: Twice a week    Attends Religious Services: More than 4 times per year    Active Member of [employer] or Organizations: No    Attends Music therapist: Never    Marital Status: Married   Allergies  Allergen Reactions   Dexamethasone Other (See Comments)    halluncinations   Ciprofloxacin Other (See Comments)    Unknown reaction per pt   Meperidine Hcl Nausea And Vomiting and Other (See Comments)    Causes blood pressure to drop when taken with Phenergan through IV   Opium Other (See Comments)    Causes blood pressure to drop when taken with Phenergan through IV   Promethazine Hcl Nausea And Vomiting and Other (See Comments)    Causes blood pressure to drop when taken with Demeral via IV   Propoxyphene N-Acetaminophen Other (See Comments)    "pass out"    Medications   (Not in a hospital admission)    No current facility-administered medications for this encounter.  Current Outpatient Medications:    acetaminophen (TYLENOL) 500 MG tablet, Take 500 mg by mouth every 6 (six) hours as needed for moderate pain or headache. , Disp: , Rfl:     albuterol (PROVENTIL HFA;VENTOLIN HFA) 108 (90 BASE) MCG/ACT inhaler, Inhale 2 puffs into the lungs every 6 (six) hours as needed for wheezing or shortness of breath., Disp: , Rfl:    ASPIRIN LOW DOSE 81 MG tablet, Take 81 mg by mouth daily., Disp: , Rfl:    Cholecalciferol (VITAMIN D3) 1000 units CAPS, Take 1,000 Units by mouth. , Disp: ,  Rfl:    guaiFENesin-dextromethorphan (ROBITUSSIN DM) 100-10 MG/5ML syrup, Take 5 mLs by mouth every 4 (four) hours as needed for cough., Disp: , Rfl:    LORazepam (ATIVAN) 0.5 MG tablet, Take 0.5 mg by mouth at bedtime., Disp: , Rfl:    LORazepam (ATIVAN) 2 MG tablet, Take half a tablet by mouth 1 hour prior to imaging scan on 12/06/22. Take an additional half a tablet 30 minutes later if desired effects not achieved.Take half a tablet by mouth 1 hour prior to imaging scan on 12/06/22. Take an additional half a tablet 30 minutes later if desired effects not achieved., Disp: 1 tablet, Rfl: 0   Polyethylene Glycol 400 (BLINK TEARS) 0.25 % GEL, Apply to eye., Disp: , Rfl:    traZODone (DESYREL) 50 MG tablet, Take 1 tablet (50 mg total) by mouth at bedtime as needed for up to 30 doses for sleep., Disp: 30 tablet, Rfl: 0   vitamin B-12 (CYANOCOBALAMIN) 1000 MCG tablet, Take 1,000 mcg by mouth daily., Disp: , Rfl:   Vitals   Vitals:   12/18/22 1434 12/18/22 1436 12/18/22 1441 12/18/22 1445  BP:   120/65   Pulse:   72 63  Resp:   18 19  Temp:   97.6 F (36.4 C)   TempSrc:   Oral   SpO2:  99% 98% 100%  Weight: 51.7 kg     Height: '5\' 3"'$  (1.6 m)        Body mass index is 20.19 kg/m.  Physical Exam   Exam performed over telemedicine with 2-way video and audio communication and with assistance of bedside RN  Physical Exam Gen: A&O x4, NAD Resp: normal WOB CV: extremities appear well-perfused  Neuro: *MS: A&O x4. Follows multi-step commands.  *Speech: nondysarthric, no aphasia, able to name and repeat *CN: PERRL 60m, EOMI, VFF by confrontation, sensation  intact, smile symmetric, hearing intact to voice *Motor:   Normal bulk.  No tremor, rigidity or bradykinesia.  Left lower extremity mild pronator drift.. *Sensory: SILT. Symmetric. No double-simultaneous extinction.   *Coordination:  Finger-to-nose, heel-to-shin, rapid alternating motions were intact. *Reflexes:  UTA 2/2 tele-exam *Gait: deferred  NIHSS  1a Level of Conscious.: 0 1b LOC Questions: 0 1c LOC Commands: 0 2 Best Gaze: 0 3 Visual: 0 4 Facial Palsy: 0 5a Motor Arm - left: 0 5b Motor Arm - Right: 0 6a Motor Leg - Left: 1 6b Motor Leg - Right: 0 7 Limb Ataxia: 0 8 Sensory: 0 9 Best Language: 0 10 Dysarthria: 0 11 Extinct. and Inatten.: 0  TOTAL: 1      Labs   CBC: No results for input(s): "WBC", "NEUTROABS", "HGB", "HCT", "MCV", "PLT" in the last 168 hours.  Basic Metabolic Panel:  Lab Results  Component Value Date   NA 135 10/20/2022   K 4.0 10/20/2022   CO2 27 10/20/2022   GLUCOSE 93 10/20/2022   BUN 10 10/20/2022   CREATININE 0.77 10/20/2022   CALCIUM 8.9 10/20/2022   GFRNONAA >60 10/20/2022   GFRAA >60 10/27/2018   Lipid Panel:  Lab Results  Component Value Date   LDLCALC 58 09/03/2022   HgbA1c:  Lab Results  Component Value Date   HGBA1C 6.1 (H) 09/03/2022   Urine Drug Screen: No results found for: "LABOPIA", "COCAINSCRNUR", "LABBENZ", "AMPHETMU", "THCU", "LABBARB"  Alcohol Level No results found for: "ETH"  CT Head without contrast: Neg.  CT angio Head and Neck with contrast: Negtive for LVO.  MRI Brain pending.  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  Abrupt onset of symptoms at around 12:30 today on exam she appeared to have left leg mild drift with and a stroke scale of 1.  With  history of aneurysm and low NIH her symptoms are too mild to treat and risk outweigh the benefits of TNK.  This was discussed with the patient as well as her daughter and they are in agreement. CTA did not show an LVO.  Recommendations   Plavix 75 mg and 325 mg of aspirin stat.  Discussed with Dr. Vella Kohler. Transferred the [facility] to [facility] for close evaluation and frequent neurochecks.  If she decompensates activate code stroke. Brain MRI without contrast Echocardiogram Permissible hypertension systolic less than 938 for the next 24 hours then resume gradual normotension. PT OT and speech therapy. N.p.o. until bedside swallow eval Check hemoglobin A1c Fasting lipid panel Tobacco cessation counseling.   Discussed plan with ER attending.  Total 80 minutes spent on counseling patient and coordinating care, writing notes and reviewing chart.  MDM: High. Pertinent labs, imaging results reviewed by me and considered in my decision making. Independently reviewed imaging. Medical records reviewed. Discussed the patient with another medical provider/personnel. Obtained history from someone other than the patient.      Adlai Nieblas,MD  ______________________________________________________________________   Thank you for the opportunity to take part in the care of this patient. If you have any further questions, please contact the neurology consultation attending.  Signed,  Egbert Garibaldi, MD  If 7pm- 7am, please page neurology on call as listed in [place].  **Any copied and pasted documentation in this note was written by me in another application not billed for and pasted by me into this document.

## 2022-12-18 NOTE — ED Notes (Signed)
Upon patients arrival to ER, patient expressed dissatisfaction of being placed in hall bed. Transport team and this RN attempted to explain to patient the process and rational of being placed in hall bed. Charge RN aware of patients complaint and no closed room available for patient at time of patients arrival. Patient also demanding to know length of time before completion of MRI. Explained to patient that this RN would call MRI to attempt to find out but would need to check patient in before doing so. Patient reported that she was calling daughter and that she wanted to leave AMA. The risks to include death, and worsening medical condition explained to patient, patient verbalized understanding and requested to sign AMA form. Patient signed AMA form and was wheeled to lobby to wait for ride.

## 2022-12-18 NOTE — ED Provider Notes (Incomplete)
[facility] Provider Note  CSN: [id number] Arrival date & time: 12/18/22 1407  Chief Complaint(s) Near Syncope  HPI Stacy Hansen is a 72 y.o. female with PMH tendinitis, brain aneurysm, Crohn's disease, panic disorder, previous CVA who presents emergency department for evaluation of an episode of near syncope.  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  She also endorses new numbness and weakness of the left side of her body has been going on for approximately 1 hour prior to arrival to the ER.  Previous physician activated a stroke alert.  Currently primarily endorsing headache.   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   Hyperplastic colon polyp    Panic attacks    Stroke [facility])    TIA (transient ischemic attack)    Tobacco abuse    Patient Active Problem List   Diagnosis Date Noted   Closed fracture of middle phalanx of left ring finger 09/13/2022   Encounter for general adult medical examination with abnormal findings 09/13/2022   Productive cough 09/13/2022   Osteopenia of lumbar spine 02/27/2020   Arthritis, multiple joint involvement 12/12/2019   Nocturnal leg cramps 04/05/2019   Dyslipidemia 12/13/2017   Gastroesophageal reflux disease without esophagitis 12/13/2017   Derangement of posterior horn of medial meniscus of left knee    Derangement of anterior horn of lateral meniscus of left knee    Multiple pulmonary nodules 07/07/2016   Seasonal allergic rhinitis due to pollen 02/11/2016   Tobacco use 11/02/2014   Brain aneurysm 11/02/2014   Crohn disease ([place]) 11/02/2014   Fibromyalgia 11/02/2014   GAD (generalized anxiety  disorder) 11/02/2014   Restless leg syndrome 07/10/2014   H/O: CVA (cerebrovascular accident) 07/18/2013   Insomnia 08/16/2012   Vitamin D deficiency 01/22/2011   Osteoporosis without current pathological fracture 01/21/2011   Superficial varicosities 01/21/2011   Tear film insufficiency 01/21/2011   Atrophic vaginitis 01/21/2011   Symptomatic menopausal or female climacteric states 01/21/2011   Unspecified glaucoma 01/21/2011   Home Medication(s) Prior to Admission medications   Medication Sig Start Date End Date Taking? Authorizing Provider  acetaminophen (TYLENOL) 500 MG tablet Take 500 mg by mouth every 6 (six) hours as needed for moderate pain or headache.     [provider]  albuterol (PROVENTIL HFA;VENTOLIN HFA) 108 (90 BASE) MCG/ACT inhaler Inhale 2 puffs into the lungs every 6 (six) hours as needed for wheezing or shortness of breath.    [provider]  ASPIRIN LOW DOSE 81 MG tablet Take 81 mg by mouth daily. 06/28/22   [provider]  Cholecalciferol (VITAMIN D3) 1000 units CAPS Take 1,000 Units by mouth.     [provider]  guaiFENesin-dextromethorphan (ROBITUSSIN DM) 100-10 MG/5ML syrup Take 5 mLs by mouth every 4 (four) hours as needed for cough.    [provider]  LORazepam (ATIVAN) 0.5 MG tablet Take 0.5 mg by mouth at bedtime.    [provider]  LORazepam (ATIVAN) 2 MG tablet Take half a tablet by mouth 1 hour prior to imaging scan on 12/06/22. Take an additional half a  tablet 30 minutes later if desired effects not achieved.Take half a tablet by mouth 1 hour prior to imaging scan on 12/06/22. Take an additional half a tablet 30 minutes later if desired effects not achieved. 12/03/22   Lura Em, PA  Polyethylene Glycol 400 (BLINK TEARS) 0.25 % GEL Apply to eye.    [provider]  traZODone (DESYREL) 50 MG tablet Take 1 tablet (50 mg total) by mouth at bedtime as needed for up to 30 doses for sleep.  09/17/22   Johnette Abraham, MD  vitamin B-12 (CYANOCOBALAMIN) 1000 MCG tablet Take 1,000 mcg by mouth daily.    [provider]                                                                                                                                    Past Surgical History Past Surgical History:  Procedure Laterality Date   CATARACT EXTRACTION Bilateral    EYE SURGERY     IR GENERIC HISTORICAL  07/28/2016   IR RADIOLOGIST EVAL & MGMT 07/28/2016 [facility]INTER[facility] RAD   KNEE ARTHROSCOPY WITH MEDIAL MENISECTOMY Left 12/01/2017   Procedure: LEFT KNEE ARTHROSCOPY WITH PARTIAL MEDIAL AND LATERAL MENISECTOMY;  Surgeon: Carole Civil, MD;  Location: [facility];  Service: Orthopedics;  Laterality: Left;   ORIF ULNAR FRACTURE Left    ROTATOR CUFF REPAIR Left    TONSILLECTOMY     TUBAL LIGATION     VAGINAL HYSTERECTOMY     has her ovaries   VARICOSE VEIN SURGERY Right    Family History Family History  Problem Relation Age of Onset   Diabetes Mother    Kidney failure Mother    Stroke Father    Colon cancer Brother        in his 39's   Diabetes Brother    Diabetes Sister    Stroke Paternal Grandfather    Other Sister        perforated bowel with colostomy   Cancer Brother        Eye    Social History Social History   Tobacco Use   Smoking status: Every Day    Packs/day: 0.50    Years: 20.00    Total pack years: 10.00    Types: Cigarettes   Smokeless tobacco: Never  Vaping Use   Vaping Use: Former  Substance Use Topics   Alcohol use: No    Alcohol/week: 0.0 standard drinks of alcohol   Drug use: No   Allergies Dexamethasone, Ciprofloxacin, Meperidine hcl, Opium, Promethazine hcl, and Propoxyphene n-acetaminophen  Review of Systems Review of Systems  Neurological:  Positive for syncope, weakness, numbness and headaches.    Physical Exam Vital Signs  I have reviewed the triage vital signs BP 120/65 (BP Location: Left Arm)   Pulse 63   Temp 97.6 F (36.4 C)  (Oral)   Resp 19   Ht '5\' 3"'$  (1.6 m)  Wt 51.7 kg   SpO2 100%   BMI 20.19 kg/m   Physical Exam Vitals and nursing note reviewed.  Constitutional:      General: She is not in acute distress.    Appearance: She is well-developed.  HENT:     Head: Normocephalic and atraumatic.  Eyes:     Conjunctiva/sclera: Conjunctivae normal.  Cardiovascular:     Rate and Rhythm: Normal rate and regular rhythm.     Heart sounds: No murmur heard. Pulmonary:     Effort: Pulmonary effort is normal. No respiratory distress.     Breath sounds: Normal breath sounds.  Abdominal:     Palpations: Abdomen is soft.     Tenderness: There is no abdominal tenderness.  Musculoskeletal:        General: No swelling.     Cervical back: Neck supple.  Skin:    General: Skin is warm and dry.     Capillary Refill: Capillary refill takes less than 2 seconds.  Neurological:     Mental Status: She is alert.     Cranial Nerves: No cranial nerve deficit.     Sensory: Sensory deficit present.     Motor: No weakness.  Psychiatric:        Mood and Affect: Mood normal.     ED Results and Treatments Labs (all labs ordered are listed, but only abnormal results are displayed) Labs Reviewed  COMPREHENSIVE METABOLIC PANEL - Abnormal; Notable for the following components:      Result Value   Glucose, Bld 132 (*)    Total Bilirubin 0.1 (*)    All other components within normal limits  CBG MONITORING, ED - Abnormal; Notable for the following components:   Glucose-Capillary 137 (*)    All other components within normal limits  CBC  ETHANOL  DIFFERENTIAL  URINALYSIS, ROUTINE W REFLEX MICROSCOPIC  RAPID URINE DRUG SCREEN, HOSP PERFORMED  PROTIME-INR  APTT  I-STAT CHEM 8, ED  TROPONIN I (HIGH SENSITIVITY)                                                                                                                          Radiology CT ANGIO HEAD NECK W WO CM  Result Date: 12/18/2022 CLINICAL DATA:  Left-sided  numbness EXAM: CT ANGIOGRAPHY HEAD AND NECK TECHNIQUE: Multidetector CT imaging of the head and neck was performed using the standard protocol during bolus administration of intravenous contrast. Multiplanar CT image reconstructions and MIPs were obtained to evaluate the vascular anatomy. Carotid stenosis measurements (when applicable) are obtained utilizing NASCET criteria, using the distal internal carotid diameter as the denominator. RADIATION DOSE REDUCTION: This exam was performed according to the departmental dose-optimization program which includes automated exposure control, adjustment of the mA and/or kV according to patient size and/or use of iterative reconstruction technique. CONTRAST:  9m OMNIPAQUE IOHEXOL 350 MG/ML SOLN COMPARISON:  None Available. FINDINGS: CT HEAD FINDINGS See same day CT brain for additional findings CTA NECK FINDINGS Aortic  arch: Retroaortic right subclavian artery. Common origin of the right left common carotid arteries. Right carotid system: No evidence of dissection, stenosis (50% or greater), or occlusion. Left carotid system: No evidence of dissection, stenosis (50% or greater), or occlusion. Vertebral arteries: Left dominant. No evidence of dissection, stenosis (50% or greater), or occlusion. Skeleton: Age indeterminate, but acute appearing compression deformity of the superior endplate of T3. Other neck: Negative. Upper chest: Retroaortic right subclavian artery. Review of the MIP images confirms the above findings CTA HEAD FINDINGS Anterior circulation: No significant stenosis, proximal occlusion, aneurysm, or vascular malformation. Posterior circulation: The basilar artery is diffusely small in caliber, unchanged compared to MRI 08/21/2020. distal basilar artery is supplied by the persistent trigeminal artery. 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 Anatomic variants: Persistent trigeminal artery. The P2 segment of the right PCA is  predominantly supplied by right PCOM. Review of the MIP images confirms the above findings IMPRESSION: 1. No intracranial large vessel occlusion or significant stenosis. 2. No hemodynamically significant stenosis in the neck. 3. Age indeterminate, but acute appearing compression deformity of the superior endplate of T3. Electronically Signed   By: Marin Roberts M.D.   On: 12/18/2022 15:38   CT HEAD CODE STROKE WO CONTRAST  Result Date: 12/18/2022 CLINICAL DATA:  Code stroke.  Left-sided numbness and weakness EXAM: CT HEAD WITHOUT CONTRAST TECHNIQUE: Contiguous axial images were obtained from the base of the skull through the vertex without intravenous contrast. RADIATION DOSE REDUCTION: This exam was performed according to the departmental dose-optimization program which includes automated exposure control, adjustment of the mA and/or kV according to patient size and/or use of iterative reconstruction technique. COMPARISON:  MRI Brain 12/06/22, CT head 10/20/22 FINDINGS: Brain: No evidence of acute infarction, hemorrhage, hydrocephalus, extra-axial collection or mass lesion/mass effect. Vascular: No hyperdense vessel or unexpected calcification. Skull: Normal. Negative for fracture or focal lesion. Sinuses/Orbits: No middle ear or mastoid effusion. Bilateral paranasal sinuses are clear. Bilateral lens replacement. Orbits are otherwise unremarkable. Other: None. ASPECTS ([place] Stroke Program Early CT Score):10 IMPRESSION: No hemorrhage or CT evidence of an acute infarct. Aspects is 10. Discussed with Dr. Melina Copa on 12/18/22 at 3:15 PM. Electronically Signed   By: Marin Roberts M.D.   On: 12/18/2022 15:16   DG Chest Portable 1 View  Result Date: 12/18/2022 CLINICAL DATA:  Near syncope, chest tightness, headache EXAM: PORTABLE CHEST 1 VIEW COMPARISON:  10/20/2022 FINDINGS: Single frontal view of the chest demonstrates an unremarkable cardiac silhouette. Chronic background scarring without airspace disease, effusion,  or pneumothorax. No acute bony abnormalities. IMPRESSION: 1. Stable chest, no acute process. Electronically Signed   By: Randa Ngo M.D.   On: 12/18/2022 15:10    Pertinent labs & imaging results that were available during my care of the patient were reviewed by me and considered in my medical decision making (see MDM for details).  Medications Ordered in ED Medications  iohexol (OMNIPAQUE) 350 MG/ML injection 75 mL (75 mLs Intravenous Contrast Given 12/18/22 1515)  Procedures .Critical Care  Performed by: Teressa Lower, MD Authorized by: Teressa Lower, MD   Critical care provider statement:    Critical care time (minutes):  30   Critical care was necessary to treat or prevent imminent or life-threatening deterioration of the following conditions:  CNS failure or compromise   Critical care was time spent personally by me on the following activities:  Development of treatment plan with patient or surrogate, discussions with consultants, evaluation of patient's response to treatment, examination of patient, ordering and review of laboratory studies, ordering and review of radiographic studies, ordering and performing treatments and interventions, pulse oximetry, re-evaluation of patient's condition and review of old charts   (including critical care time)  Medical Decision Making / ED Course   This patient presents to the ED for concern of headache, numbness, weakness, this involves an extensive number of treatment options, and is a complaint that carries with it a high risk of complications and morbidity.  The differential diagnosis includes ICH, aneurysm, CVA, migraine, orthostatic presyncope, vasovagal presyncope, mass  MDM: Patient seen emerged department for evaluation of multiple complaints as described above.  Previous provider called a stroke alert  and the patient had returned from CT on my arrival to the emergency department.  Physical exam with some subjective numbness on the left but no cranial nerve deficits and no weakness.  Stroke neurologist recommending transfer to [facility] for MRI and initiation of aspirin Plavix.  Patient received a headache cocktail and on reevaluation her headache has improved significantly.  Laboratory evaluation largely unremarkable.  Patient then transferred to [facility] for [facility].   Additional history obtained: -Additional history obtained from husband -External records from outside source obtained and reviewed including: Chart review including previous notes, labs, imaging, consultation notes   Lab Tests: -I ordered, reviewed, and interpreted labs.   The pertinent results include:   Labs Reviewed  COMPREHENSIVE METABOLIC PANEL - Abnormal; Notable for the following components:      Result Value   Glucose, Bld 132 (*)    Total Bilirubin 0.1 (*)    All other components within normal limits  CBG MONITORING, ED - Abnormal; Notable for the following components:   Glucose-Capillary 137 (*)    All other components within normal limits  CBC  ETHANOL  DIFFERENTIAL  URINALYSIS, ROUTINE W REFLEX MICROSCOPIC  RAPID URINE DRUG SCREEN, HOSP PERFORMED  PROTIME-INR  APTT  I-STAT CHEM 8, ED  TROPONIN I (HIGH SENSITIVITY)      EKG   EKG Interpretation  Date/Time:  Saturday December 18 2022 14:41:14 EST Ventricular Rate:  73 PR Interval:  134 QRS Duration: 92 QT Interval:  409 QTC Calculation: 451 R Axis:   82 Text Interpretation: Sinus rhythm Borderline right axis deviation No significant change since prior 12/23 Confirmed by Aletta Edouard [phone]) on 12/18/2022 2:45:54 PM         Imaging Studies ordered: I ordered imaging studies including CT head, CT angio brain and neck I independently visualized and interpreted imaging. I agree with the radiologist interpretation   Medicines ordered and  prescription drug management: Meds ordered this encounter  Medications   iohexol (OMNIPAQUE) 350 MG/ML injection 75 mL    -I have reviewed the patients home medicines and have made adjustments as needed  Critical interventions Stroke activation and consideration of TNK  Consultations Obtained: I requested consultation with the stroke neurologist,  and discussed lab and imaging findings as well as pertinent plan - they recommend: Transfer to [facility] for MRI, aspirin Plavix   Cardiac Monitoring: The patient was maintained on a cardiac monitor.  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

## 2022-12-18 NOTE — Progress Notes (Signed)
1501 call time 1501 beeper time 1507 exam started 1509 exam finished 1509 images sent to soc 1510 exam completed in epic  1510 [facility] radiology called

## 2022-12-18 NOTE — ED Notes (Signed)
Pt with c/o chest tightness since arriving to room.

## 2022-12-18 NOTE — ED Notes (Signed)
Patient ambulated with assistance to the bathroom.

## 2022-12-18 NOTE — ED Provider Notes (Signed)
MSE was initiated and I personally evaluated the patient and placed orders (if any) at  3:00 PM on December 18, 2022.  The patient appears stable so that the remainder of the MSE may be completed by another provider.  She had an episode of near syncope today at work.  This is complicated by some ongoing tenderness and pressure in her head that is been going on for weeks.  She feels she is having some new numbness and weakness in the left side of her body that is been going on for about an hour.  Stroke activation has been placed.   Hayden Rasmussen, MD 12/18/22 [phone]

## 2022-12-19 LAB — I-STAT CHEM 8, ED
BUN: 13 mg/dL (ref 8–23)
Calcium, Ion: 1.13 mmol/L — ABNORMAL LOW (ref 1.15–1.40)
Chloride: 106 mmol/L (ref 98–111)
Creatinine, Ser: 0.7 mg/dL (ref 0.44–1.00)
Glucose, Bld: 129 mg/dL — ABNORMAL HIGH (ref 70–99)
HCT: 39 % (ref 36.0–46.0)
Hemoglobin: 13.3 g/dL (ref 12.0–15.0)
Potassium: 4.3 mmol/L (ref 3.5–5.1)
Sodium: 141 mmol/L (ref 135–145)
TCO2: 25 mmol/L (ref 22–32)

## 2022-12-31 ENCOUNTER — Ambulatory Visit: Payer: Medicare Other | Attending: Family Medicine | Admitting: Physical Therapy

## 2022-12-31 ENCOUNTER — Encounter: Payer: Self-pay | Admitting: Physical Therapy

## 2022-12-31 VITALS — BP 120/62 | HR 64

## 2022-12-31 DIAGNOSIS — R42 Dizziness and giddiness: Secondary | ICD-10-CM | POA: Insufficient documentation

## 2022-12-31 DIAGNOSIS — R2681 Unsteadiness on feet: Secondary | ICD-10-CM | POA: Diagnosis present

## 2022-12-31 NOTE — Therapy (Signed)
OUTPATIENT PHYSICAL THERAPY VESTIBULAR EVALUATION     Patient Name: Stacy Hansen MRN: [id number] DOB:Nov 14, 1951, 72 y.o., female Today's Date: 12/31/2022  END OF SESSION:  PT End of Session - 12/31/22 1357     Visit Number 1    Number of Visits 9    Date for PT Re-Evaluation 01/30/23    Authorization Type [employer] Medicare    PT Start Time 1233    PT Stop Time 1315    PT Time Calculation (min) 42 min    Activity Tolerance Patient tolerated treatment well    Behavior During Therapy WFL for tasks assessed/performed             Past Medical History:  Diagnosis Date   Anxiety    Brain aneurysm    Crohn disease ([place])    Depression    Fibromyalgia    GERD (gastroesophageal reflux disease)    Headache    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   Hyperplastic colon polyp    Panic attacks    Stroke [facility])    TIA (transient ischemic attack)    Tobacco abuse    Past Surgical History:  Procedure Laterality Date   CATARACT EXTRACTION Bilateral    EYE SURGERY     IR GENERIC HISTORICAL  07/28/2016   IR RADIOLOGIST EVAL & MGMT 07/28/2016 [facility]   KNEE ARTHROSCOPY WITH MEDIAL MENISECTOMY Left 12/01/2017   Procedure: LEFT KNEE ARTHROSCOPY WITH PARTIAL MEDIAL AND LATERAL MENISECTOMY;  Surgeon: Carole Civil, MD;  Location: [facility];  Service: Orthopedics;  Laterality: Left;   ORIF ULNAR FRACTURE Left    ROTATOR CUFF REPAIR Left    TONSILLECTOMY     TUBAL LIGATION     VAGINAL HYSTERECTOMY     has her ovaries   VARICOSE VEIN SURGERY Right    Patient Active Problem List   Diagnosis Date Noted   Closed fracture of middle phalanx of left ring finger 09/13/2022   Encounter for general adult medical examination with abnormal findings 09/13/2022   Productive cough 09/13/2022   Osteopenia of lumbar spine 02/27/2020   Arthritis, multiple joint involvement 12/12/2019   Nocturnal leg cramps 04/05/2019   Dyslipidemia 12/13/2017   Gastroesophageal reflux disease without  esophagitis 12/13/2017   Derangement of posterior horn of medial meniscus of left knee    Derangement of anterior horn of lateral meniscus of left knee    Multiple pulmonary nodules 07/07/2016   Seasonal allergic rhinitis due to pollen 02/11/2016   Tobacco use 11/02/2014   Brain aneurysm 11/02/2014   Crohn disease ([place]) 11/02/2014   Fibromyalgia 11/02/2014   GAD (generalized anxiety disorder) 11/02/2014   Restless leg syndrome 07/10/2014   H/O: CVA (cerebrovascular accident) 07/18/2013   Insomnia 08/16/2012   Vitamin D deficiency 01/22/2011   Osteoporosis without current pathological fracture 01/21/2011   Superficial varicosities 01/21/2011   Tear film insufficiency 01/21/2011   Atrophic vaginitis 01/21/2011   Symptomatic menopausal or female climacteric states 01/21/2011   Unspecified glaucoma 01/21/2011    PCP: Curly Rim, MD  REFERRING PROVIDER: Jettie Booze, NP  REFERRING DIAG: R42 (ICD-10-CM) - Vertigo  THERAPY DIAG:  Dizziness and giddiness  Unsteadiness on feet  ONSET DATE: 12/28/2022  Rationale for Evaluation and Treatment: Rehabilitation  SUBJECTIVE:   SUBJECTIVE STATEMENT: Had COVID back in December and fainted and fell back and hit her head. Has had dizziness since then. If she moves too fast, or bending over and coming up too fast,  then will get dizzy. Sometimes when she is walking, will feel dizzy. Sometimes will have dizziness in the morning when getting up to sit on the edge of the bed. Feeling some lightheaded right now.  Reports her eyes will feel like they will jump a little bit. Has a hx of BPPV back in 2019. Reports this dizziness does not feel the same as when she had BPPV before. Has ringing and roaring in her ears, will see the ENT in March. Reports feeling more off balance, will have to reach for things.  Pt accompanied by:  Latricia Heft   PERTINENT HISTORY: Stacy Hansen is a 72 y.o. female here for follow up from recent ED visit.  Had near syncopal episode on 02/03 while at work. Reports headache and left sided numbness. Stroke alert was called. CT was normal. Neurology consulted and recommended transfer to [facility] for MRI, start on Aspirin and Plavix. Upon arrival pt left AMA without MRI. Thought pt could possibly have had TIA. Has seen cardiology for dizziness and dyspnea on exertion. Has seen ENT for tinnitus left ear. Hx brain aneurysm, last evaluted with MRI 12/06/2022. Aneurysm unchanged from previous imaging. Denies headache today. Reports feeling like the prednisone is giving her side effects. Has had sinus pain and pressure for a few weeks. Has not seemed to help these symptoms.   Has been dealing with tinnitus and hearing loss. Has seen ENT. Was put on prednisone but unsure why. Needing hearing aids. Continues to have vertigo. Concerned she may get hurt and fall at work. Brings paperwork to be filled out until ENT issue resolved, neurology clears. Has audiology appointment in March and ENT f/u. Follows up with cardiology on 01/13/23  PMH Anxiety, hearing loss in R ear, tendinitis, brain aneurysm, Crohn's disease, panic disorder, previous CVA, fibromyalgia, osteoporosis  PAIN:  Are you having pain? No  Vitals:   12/31/22 1250  BP: 120/62  Pulse: 64     PRECAUTIONS: Fall  WEIGHT BEARING RESTRICTIONS: No  FALLS: Has patient fallen in last 6 months? Yes. 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 2. No new aneurysm. 3. Stable normal for age noncontrast brain MRI with no acute intracranial pathology.  CT Head: 12/18/22 IMPRESSION: 1. No intracranial large vessel occlusion or significant stenosis. 2. No hemodynamically significant stenosis in the neck. 3. Age indeterminate, but acute appearing compression deformity of the superior endplate of T3.    COGNITION: Overall cognitive status: Within functional limits for tasks assessed   Cervical ROM:   Limited extension and cervical rotation AROM.    GAIT: Gait pattern: WFL and step through pattern Distance walked: Clinic distances, some unsteadiness walking in and out  Assistive device utilized: None Level of assistance: SBA   PATIENT SURVEYS:  FOTO Staff did not capture  VESTIBULAR ASSESSMENT:  GENERAL OBSERVATION: Ambulates in with no AD, some unsteadiness.  SYMPTOM BEHAVIOR:  Subjective history: See above.   Non-Vestibular symptoms: changes in hearing, changes in vision, neck pain, tinnitus, nausea/vomiting, and has nausea with the ringing and roaring in her ears  Type of dizziness: Imbalance (Disequilibrium) and Lightheadedness/Faint  Frequency:Daily  Duration: Sometimes a few minutes   Aggravating factors: Induced by position change: supine to sit and Induced by motion: occur when walking, bending down to the ground, turning body quickly, and turning head quickly  Relieving factors:  sitting and staying still   Progression of symptoms: unchanged  OCULOMOTOR EXAM:  Ocular Alignment: normal  Ocular ROM: No Limitations  Spontaneous Nystagmus: absent  Gaze-Induced Nystagmus: absent  Smooth Pursuits:  feels blurry and incr pain in her eyes going to the L, has to close her eyes at times or bring them back to midline.   Saccades:  ~2 saccadic beats back to midline, pt  reports pain when going to the L, but reports it does not feel as bad as smooth pursuits    VESTIBULAR - OCULAR REFLEX:   Slow VOR: Normal  VOR Cancellation: Normal, pt reporting blurred vision when going to the L   Head-Impulse Test: Unable to accurately assess due to patient unable to relax her neck.   Dynamic Visual Acuity: Not assessed during eval    POSITIONAL TESTING: Right Roll Test: no nystagmus Left Roll Test: no nystagmus and feeling lightheadedness  Right Sidelying: no nystagmus and mild dizziness, no spinning  Left Sidelying: no nystagmus and lightheadedness   MOTION SENSITIVITY:  Motion Sensitivity Quotient Intensity: 0 = none, 1 = Lightheaded, 2 = Mild, 3 = Moderate, 4 = Severe, 5 = Vomiting  Intensity  1. Sitting to supine   2. Supine to L side 1  3. Supine to R side 0  4. Supine to sitting 2  5. L Hallpike-Dix   6. Up from L    7. R Hallpike-Dix   8. Up from R    9. Sitting, head tipped to L knee 2  10. Head up from L knee 2  11. Sitting, head tipped to R knee 3  12. Head up from R knee 3  13. Sitting head turns x5 0  14.Sitting head nods x5 0  15. In stance, 180 turn to L    16. In stance, 180 turn to R       VESTIBULAR TREATMENT:                                                                                                   N/A during eval  PATIENT EDUCATION: Education details: Clinical findings, POC, Unsure of pt's cause for dizziness - will have to further assess orthostatics at next session due to lightheadedness, pt negative for positional testing.  Person educated: Patient and Spouse Education method: Explanation Education comprehension: verbalized understanding  HOME EXERCISE PROGRAM:  GOALS: Goals reviewed with patient?   SHORT TERM GOALS: ALL STGS = LTGS  LONG TERM GOALS: ALL LTGS = 01/28/2023  Pt will be independent with final vestibular HEP in order to build upon functional gains made in therapy.  Baseline:  Goal status:  INITIAL  2.  Pt will report items on MSQ to a 1/5 or less in order to demo improved motion sensitivity.  Baseline:  Goal status: INITIAL  3.  mCTSIB to be assessed with LTG written.  Baseline:  Goal status: INITIAL  4.  FGA to be assessed with LTG written.  Baseline:  Goal status: INITIAL   ASSESSMENT:  CLINICAL IMPRESSION: Patient is a 72 year old female referred to Neuro OPPT for Vertigo.   Pt's PMH is significant for: Anxiety, hearing loss in R ear, tendinitis, brain aneurysm, Crohn's disease, panic disorder, previous CVA, fibromyalgia . The following deficits were present during the exam: blurred vision and pain behind L eye with smooth pursuits and saccades to the L, dizziness with bending over worse to R than L,lightheadedness with positional changes . Pt negative for positional testing, mainly reporting lightheadedness with position changes. Will further need to assess orthostatics at next visit. Pt's cause of dizziness is unclear at this time. Pt would benefit from skilled PT to address these impairments and functional limitations to maximize functional mobility independence and decr dizziness.    OBJECTIVE IMPAIRMENTS: decreased activity tolerance, decreased balance, difficulty walking, dizziness, and impaired flexibility.   ACTIVITY LIMITATIONS: lifting, bending, standing, transfers, bed mobility, and locomotion level  PARTICIPATION LIMITATIONS: community activity and occupation  PERSONAL FACTORS: Age, Behavior pattern, Past/current experiences, Time since onset of injury/illness/exacerbation, and 3+ comorbidities: Anxiety, hearing loss in R ear, tendinitis, brain aneurysm, Crohn's disease, panic disorder, previous CVA, fibromyalgia   are also affecting patient's functional outcome.   REHAB POTENTIAL: Good  CLINICAL DECISION MAKING: Evolving/moderate complexity  EVALUATION COMPLEXITY: Moderate   PLAN:  PT FREQUENCY: 2x/week  PT DURATION: 4 weeks  PLANNED  INTERVENTIONS: Therapeutic exercises, Therapeutic activity, Neuromuscular re-education, Balance training, Gait training, Patient/Family education, Self Care, Vestibular training, Canalith repositioning, DME instructions, and Re-evaluation  PLAN FOR NEXT SESSION: Assess orthostatics. Look at [facility], mctsib, and FGA as appropriate and write goals. Initial HEP    Arliss Journey, PT, DPT  12/31/2022, 1:58 PM

## 2023-01-04 ENCOUNTER — Encounter: Payer: Medicare Other | Admitting: Physical Therapy

## 2023-01-07 ENCOUNTER — Ambulatory Visit: Payer: Medicare Other | Admitting: Physical Therapy

## 2023-01-25 DIAGNOSIS — J382 Nodules of vocal cords: Secondary | ICD-10-CM | POA: Insufficient documentation

## 2023-01-25 DIAGNOSIS — Z8669 Personal history of other diseases of the nervous system and sense organs: Secondary | ICD-10-CM | POA: Insufficient documentation

## 2023-08-11 ENCOUNTER — Other Ambulatory Visit (INDEPENDENT_AMBULATORY_CARE_PROVIDER_SITE_OTHER): Payer: Medicare Other

## 2023-08-11 ENCOUNTER — Ambulatory Visit: Payer: Medicare Other | Admitting: Orthopedic Surgery

## 2023-08-11 ENCOUNTER — Encounter: Payer: Self-pay | Admitting: Orthopedic Surgery

## 2023-08-11 VITALS — BP 102/52 | HR 73 | Ht 63.0 in | Wt 111.5 lb

## 2023-08-11 DIAGNOSIS — M5410 Radiculopathy, site unspecified: Secondary | ICD-10-CM | POA: Diagnosis not present

## 2023-08-11 DIAGNOSIS — M545 Low back pain, unspecified: Secondary | ICD-10-CM

## 2023-08-11 DIAGNOSIS — M25551 Pain in right hip: Secondary | ICD-10-CM | POA: Diagnosis not present

## 2023-08-11 MED ORDER — GABAPENTIN 100 MG PO CAPS: 100.0000 mg | ORAL_CAPSULE | Freq: Three times a day (TID) | ORAL | 2 refills | Status: AC

## 2023-08-11 MED ORDER — IBUPROFEN 800 MG PO TABS: 800.0000 mg | ORAL_TABLET | Freq: Three times a day (TID) | ORAL | 1 refills | Status: AC | PRN

## 2023-08-11 NOTE — Progress Notes (Signed)
New problem   Chief Complaint  Patient presents with   Hip Pain    R/ hurting for about a month now. Had COVID back in January and fell. Was told she had like a mini stroke. Didn't hurt hip during the fall that she knows of. Pain radiates down right leg to ankle area.    Office Visit Note   Patient: Stacy Hansen           Date of Birth: February 05, 1951           MRN: [id number] Visit Date: 08/11/2023 Requested by: April Manson, NP [address]  [place] [address] PCP: April Manson, NP   Assessment & Plan:   Encounter Diagnoses  Name Primary?   Pain in right hip    Lumbar pain    Radicular pain of right lower extremity Yes    No orders of the defined types were placed in this encounter.   72 year old female appears to have an L5 root radiculopathy  Recommend anti-inflammatory gabapentin and physical therapy  Meds ordered this encounter  Medications   ibuprofen (ADVIL) 800 MG tablet    Sig: Take 1 tablet (800 mg total) by mouth every 8 (eight) hours as needed.    Dispense:  90 tablet    Refill:  1   gabapentin (NEURONTIN) 100 MG capsule    Sig: Take 1 capsule (100 mg total) by mouth 3 (three) times daily.    Dispense:  90 capsule    Refill:  2      Subjective: Chief Complaint  Patient presents with   Hip Pain    R/ hurting for about a month now. Had COVID back in January and fell. Was told she had like a mini stroke. Didn't hurt hip during the fall that she knows of. Pain radiates down right leg to ankle area.    HPI: right leg pain x 1 month   Review of Systems  Constitutional:  Negative for chills, fever, malaise/fatigue and weight loss.  Gastrointestinal:  Negative for constipation.       Denies loss bowel control   Genitourinary:        Denies urinary retention or los of bladder control    Images personally read and my interpretation :    Images #1 pelvis normal  Image #2 excessive lordosis at the junction of L4-5 and S1  generative changes of the also has a truncal asymmetry in the coronal plane   Visit Diagnoses:  1. Radicular pain of right lower extremity   2. Pain in right hip   3. Lumbar pain      Follow-Up Instructions: No follow-ups on file.    Objective: Vital Signs: BP (!) 102/52   Pulse 73   Ht 5\' 3"  (1.6 m)   Wt 111 lb 8 oz (50.6 kg)   BMI 19.75 kg/m   Physical Exam Vitals and nursing note reviewed.  Constitutional:      Appearance: Normal appearance.  HENT:     Head: Normocephalic and atraumatic.  Eyes:     General: No scleral icterus.       Right eye: No discharge.        Left eye: No discharge.     Extraocular Movements: Extraocular movements intact.     Conjunctiva/sclera: Conjunctivae normal.     Pupils: Pupils are equal, round, and reactive to light.  Cardiovascular:     Rate and Rhythm: Normal rate.  Pulses: Normal pulses.  Musculoskeletal:     Lumbar back: Positive right straight leg raise test.  Skin:    General: Skin is warm and dry.     Capillary Refill: Capillary refill takes less than 2 seconds.  Neurological:     General: No focal deficit present.     Mental Status: She is alert and oriented to person, place, and time.     Sensory: Sensory deficit present.     Motor: No weakness.     Gait: Gait abnormal.     Deep Tendon Reflexes: Reflexes normal.  Psychiatric:        Mood and Affect: Mood normal.        Behavior: Behavior normal.        Thought Content: Thought content normal.        Judgment: Judgment normal.      Right Hip Exam  Right hip exam is normal.    Left Hip Exam  Left hip exam is normal.   Back Exam   Tenderness  The patient is experiencing tenderness in the lumbar.  Tests  Straight leg raise right: positive  Reflexes  Patellar:  normal Achilles:  normal  Other  Sensation: decreased Erythema: no back redness Scars: absent       Specialty Comments:  No specialty comments available.  Imaging: No results  found.   PMFS History: Patient Active Problem List   Diagnosis Date Noted   History of hearing loss 01/25/2023   Vocal cord nodule 01/25/2023   Tinnitus of left ear 12/14/2022   Closed fracture of middle phalanx of left ring finger 09/13/2022   Encounter for general adult medical examination with abnormal findings 09/13/2022   Productive cough 09/13/2022   Osteopenia of lumbar spine 02/27/2020   Arthritis, multiple joint involvement 12/12/2019   Nocturnal leg cramps 04/05/2019   Dyslipidemia 12/13/2017   Gastroesophageal reflux disease without esophagitis 12/13/2017   Derangement of posterior horn of medial meniscus of left knee    Derangement of anterior horn of lateral meniscus of left knee    Multiple pulmonary nodules 07/07/2016   Seasonal allergic rhinitis due to pollen 02/11/2016   Tobacco use 11/02/2014   Brain aneurysm 11/02/2014   Crohn disease ([facility]) 11/02/2014   Fibromyalgia 11/02/2014   GAD (generalized anxiety disorder) 11/02/2014   Restless leg syndrome 07/10/2014   H/O: CVA (cerebrovascular accident) 07/18/2013   Insomnia 08/16/2012   Vitamin D deficiency 01/22/2011   Osteoporosis without current pathological fracture 01/21/2011   Superficial varicosities 01/21/2011   Tear film insufficiency 01/21/2011   Atrophic vaginitis 01/21/2011   Symptomatic menopausal or female climacteric states 01/21/2011   Unspecified glaucoma 01/21/2011   Past Medical History:  Diagnosis Date   Anxiety    Brain aneurysm    Crohn disease ([facility])    Depression    Fibromyalgia    GERD (gastroesophageal reflux disease)    Headache    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   Hyperplastic colon polyp    Panic attacks    Stroke ([facility])    TIA (transient ischemic attack)    Tobacco abuse     Family History  Problem Relation Age of Onset   Diabetes Mother    Kidney failure Mother    Stroke Father    Colon cancer Brother        in his 95's   Diabetes Brother    Diabetes  Sister    Stroke Paternal Actor  Other Sister        perforated bowel with colostomy   Cancer Brother        Eye    Past Surgical History:  Procedure Laterality Date   CATARACT EXTRACTION Bilateral    EYE SURGERY     IR GENERIC HISTORICAL  07/28/2016   IR RADIOLOGIST EVAL & MGMT 07/28/2016 [facility]   KNEE ARTHROSCOPY WITH MEDIAL MENISECTOMY Left 12/01/2017   Procedure: LEFT KNEE ARTHROSCOPY WITH PARTIAL MEDIAL AND LATERAL MENISECTOMY;  Surgeon: Vickki Hearing, MD;  Location: [facility];  Service: Orthopedics;  Laterality: Left;   ORIF ULNAR FRACTURE Left    ROTATOR CUFF REPAIR Left    TONSILLECTOMY     TUBAL LIGATION     VAGINAL HYSTERECTOMY     has her ovaries   VARICOSE VEIN SURGERY Right    Social History   Occupational History   Occupation: PCA    Employer: OTHER    Comment: [employer]  Tobacco Use   Smoking status: Every Day    Current packs/day: 0.50    Average packs/day: 0.5 packs/day for 20.0 years (10.0 ttl pk-yrs)    Types: Cigarettes   Smokeless tobacco: Never  Vaping Use   Vaping status: Former  Substance and Sexual Activity   Alcohol use: No    Alcohol/week: 0.0 standard drinks of alcohol   Drug use: No   Sexual activity: Not Currently    Birth control/protection: None

## 2023-08-11 NOTE — Patient Instructions (Addendum)
Physical therapy has been ordered for you in [place] . You will need to call and schedule the phone number is [phone]    Smoking Tobacco Information, Adult Smoking tobacco can be harmful to your health. Tobacco contains a toxic colorless chemical called nicotine. Nicotine causes changes in your brain that make you want more and more. This is called addiction. This can make it hard to stop smoking once you start. Tobacco also has other toxic chemicals that can hurt your body and raise your risk of many cancers. Menthol or "lite" tobacco or cigarette brands are not safer than regular brands. How can smoking tobacco affect me? Smoking tobacco puts you at risk for: Cancer. Smoking is most commonly associated with lung cancer, but can also lead to cancer in other parts of the body. Chronic obstructive pulmonary disease (COPD). This is a long-term lung condition that makes it hard to breathe. It also gets worse over time. High blood pressure (hypertension), heart disease, stroke, heart attack, and lung infections, such as pneumonia. Cataracts. This is when the lenses in the eyes become clouded. Digestive problems. This may include peptic ulcers, heartburn, and gastroesophageal reflux disease (GERD). Oral health problems, such as gum disease, mouth sores, and tooth loss. Loss of taste and smell. Smoking also affects how you look and smell. Smoking may cause: Wrinkles. Yellow or stained teeth, fingers, and fingernails. Bad breath. Bad-smelling clothes and hair. Smoking tobacco can also affect your social life, because: It may be challenging to find places to smoke when away from home. Many workplaces, [employer], hotels, and public places are tobacco-free. Smoking is expensive. This is due to the cost of tobacco and the long-term costs of treating health problems from smoking. Secondhand smoke may affect those around you. Secondhand smoke can cause lung cancer, breathing problems, and heart  disease. Children of smokers have a higher risk for: Sudden infant death syndrome (SIDS). Ear infections. Lung infections. What actions can I take to prevent health problems? Quit smoking  Do not start smoking. Quit if you already smoke. Do not replace cigarette smoking with vaping devices, such as e-cigarettes. Make a plan to quit smoking and commit to it. Look for programs to help you, and ask your health care provider for recommendations and ideas. Set a date and write down all the reasons you want to quit. Let your friends and family know you are quitting so they can help and support you. Consider finding friends who also want to quit. It can be easier to quit with someone else, so that you can support each other. Talk with your health care provider about using nicotine replacement medicines to help you quit. These include gum, lozenges, patches, sprays, or pills. If you try to quit but return to smoking, stay positive. It is common to slip up when you first quit, so take it one day at a time. Be prepared for cravings. When you feel the urge to smoke, chew gum or suck on hard candy. Lifestyle Stay busy. Take care of your body. Get plenty of exercise, eat a healthy diet, and drink plenty of water. Find ways to manage your stress, such as meditation, yoga, exercise, or time spent with friends and family. Ask your health care provider about having regular tests (screenings) to check for cancer. This may include blood tests, imaging tests, and other tests. Where to find support To get support to quit smoking, consider: Asking your health care provider for more information and resources. Joining a support  group for people who want to quit smoking in your local community. There are many effective programs that may help you to quit. Calling the smokefree.gov counselor helpline at 1-800-QUIT-NOW 604-266-3344). Where to find more information You may find more information about quitting smoking  from: Centers for Disease Control and Prevention: http://www.osborne.com/ [employer]: smokefree.gov American Lung Association: freedomfromsmoking.org Contact a health care provider if: You have problems breathing. Your lips, nose, or fingers turn blue. You have chest pain. You are coughing up blood. You feel like you will faint. You have other health changes that cause you to worry. Summary Smoking tobacco can negatively affect your health, the health of those around you, your finances, and your social life. Do not start smoking. Quit if you already smoke. If you need help quitting, ask your health care provider. Consider joining a support group for people in your local community who want to quit smoking. There are many effective programs that may help you to quit. This information is not intended to replace advice given to you by your health care provider. Make sure you discuss any questions you have with your health care provider. Document Revised: 10/27/2021 Document Reviewed: 10/27/2021 Elsevier Patient Education  2024 [employer].

## 2023-08-17 ENCOUNTER — Ambulatory Visit: Payer: Medicare Other | Attending: Orthopedic Surgery | Admitting: Physical Therapy

## 2023-08-17 ENCOUNTER — Other Ambulatory Visit: Payer: Self-pay

## 2023-08-17 ENCOUNTER — Encounter: Payer: Self-pay | Admitting: Physical Therapy

## 2023-08-17 DIAGNOSIS — R42 Dizziness and giddiness: Secondary | ICD-10-CM | POA: Diagnosis present

## 2023-08-17 DIAGNOSIS — M5410 Radiculopathy, site unspecified: Secondary | ICD-10-CM | POA: Insufficient documentation

## 2023-08-17 DIAGNOSIS — M545 Low back pain, unspecified: Secondary | ICD-10-CM | POA: Diagnosis not present

## 2023-08-17 DIAGNOSIS — M5459 Other low back pain: Secondary | ICD-10-CM | POA: Insufficient documentation

## 2023-08-17 DIAGNOSIS — M62838 Other muscle spasm: Secondary | ICD-10-CM | POA: Diagnosis present

## 2023-08-17 DIAGNOSIS — R2681 Unsteadiness on feet: Secondary | ICD-10-CM | POA: Diagnosis present

## 2023-08-17 NOTE — Therapy (Signed)
OUTPATIENT PHYSICAL THERAPY THORACOLUMBAR EVALUATION   Patient Name: Stacy Hansen MRN: [id number] DOB:[birth year], 72 y.o., female Today's Date: 08/17/2023  END OF SESSION:  PT End of Session - 08/17/23 0920     Visit Number 1    Number of Visits 12    Date for PT Re-Evaluation 09/28/23    Authorization Type FOTO.    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   Hyperplastic colon polyp    Panic attacks    Stroke [facility])    TIA (transient ischemic attack)    Tobacco abuse    Past Surgical History:  Procedure Laterality Date   CATARACT EXTRACTION Bilateral    EYE SURGERY     IR GENERIC HISTORICAL  07/28/2016   IR RADIOLOGIST EVAL & MGMT 07/28/2016 [facility]   KNEE ARTHROSCOPY WITH MEDIAL MENISECTOMY Left 12/01/2017   Procedure: LEFT KNEE ARTHROSCOPY WITH PARTIAL MEDIAL AND LATERAL MENISECTOMY;  Surgeon: Vickki Hearing, MD;  Location: [facility];  Service: Orthopedics;  Laterality: Left;   ORIF ULNAR FRACTURE Left    ROTATOR CUFF REPAIR Left    TONSILLECTOMY     TUBAL LIGATION     VAGINAL HYSTERECTOMY     has her ovaries   VARICOSE VEIN SURGERY Right    Patient Active Problem List   Diagnosis Date Noted   History of hearing loss 01/25/2023   Vocal cord nodule 01/25/2023   Tinnitus of left ear 12/14/2022   Closed fracture of middle phalanx of left ring finger 09/13/2022   Encounter for general adult medical examination with abnormal findings 09/13/2022   Productive cough 09/13/2022   Osteopenia of lumbar spine 02/27/2020   Arthritis, multiple joint involvement 12/12/2019    Nocturnal leg cramps 04/05/2019   Dyslipidemia 12/13/2017   Gastroesophageal reflux disease without esophagitis 12/13/2017   Derangement of posterior horn of medial meniscus of left knee    Derangement of anterior horn of lateral meniscus of left knee    Multiple pulmonary nodules 07/07/2016   Seasonal allergic rhinitis due to pollen 02/11/2016   Tobacco use 11/02/2014   Brain aneurysm 11/02/2014   Crohn disease (HCC) 11/02/2014   Fibromyalgia 11/02/2014   GAD (generalized anxiety disorder) 11/02/2014   Restless leg syndrome 07/10/2014   H/O: CVA (cerebrovascular accident) 07/18/2013   Insomnia 08/16/2012   Vitamin D deficiency 01/22/2011   Osteoporosis without current pathological fracture 01/21/2011   Superficial varicosities 01/21/2011   Tear film insufficiency 01/21/2011   Atrophic vaginitis 01/21/2011   Symptomatic menopausal or female climacteric states 01/21/2011   Unspecified glaucoma 01/21/2011    REFERRING PROVIDER: Fuller Canada MD  REFERRING DIAG: Lumbar pian, radicular pain of right lower extremity.  Rationale for Evaluation and Treatment: Rehabilitation  THERAPY DIAG:  Other low back pain  Other muscle spasm  ONSET DATE: ~one month.  SUBJECTIVE:  SUBJECTIVE STATEMENT: The patient presents to the clinic with c/o low back pain with radiation into her right buttock and posterior thigh.  This started about a month ago for no apparent reason.  Her pain is rated at a 7-8/10.  Sitting decreases her pain and be up and moving around increase her pain.  Her sleep is disturbed by pain.  PERTINENT HISTORY:  Fibromyalgia, H/o CVA.  See above.  PAIN:  Are you having pain? Yes: NPRS scale: 8/10 Pain location: Low back, right buttock and posterior thigh. Pain description: Ache,  shooting. Aggravating factors: As above. Relieving factors: As above.  PRECAUTIONS: None  RED FLAGS: None   WEIGHT BEARING RESTRICTIONS: No  FALLS:  Has patient fallen in last 6 months? No.  Fall in January 2024 due to COVID.  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  PLOF: Independent  PATIENT GOALS: Get out of pain.  NEXT MD VISIT: 09/22/23.  OBJECTIVE:  Note: Objective measures were completed at Evaluation unless otherwise noted.  DIAGNOSTIC FINDINGS:  08/11/23:  Patient shows increased lordosis at the lumbosacral junction with truncal asymmetry in the coronal plane and various levels of sclerosis around the facet joints at the lower part of the spine there is also osteophyte formation noted in the L2 area.  Impression spondylosis with abnormal alignment that does not reach the level of scoliosis                 08/11/23:  We see normal femoral heads we see perhaps some mild narrowing of the weightbearing area of the hip no osteophytes no sclerosis no cyst.  Impression mild OA of the right and left hip   PATIENT SURVEYS:  FOTO 47.08.  POSTURE: rounded shoulders, forward head, and posterior pelvic tilt  PALPATION: Tender to palpation with overpressure at L5-S1.  Very tender to palpation over right Piriformis.  LUMBAR ROM:   Active lumbar flexion limited by 50% and painful and active lumbar extension is full.  LOWER EXTREMITY ROM:     WNL.  LOWER EXTREMITY MMT:    Right hip ER decreased to 2 to 2+/5 likely decreased due to pain.  LUMBAR SPECIAL TESTS:  Positive right SLR.  Equal leg lengths.  (-) FABER testing.   Normal bilateral LE DTR's.  GAIT:  Antalgic gait.  TODAY'S TREATMENT:                                                                                                                              DATE: HMP and IFC at  80-150 Hz on 40% scan x 20 minutes to patient's right LB and Piriformis region.  Normal modality response following removal of modality.  PATIENT EDUCATION:  Education details: See below. Person educated: Patient Education method: Explanation, Demonstration, Tactile cues, Verbal cues, and Handouts Education comprehension: verbalized understanding, returned demonstration, verbal cues  required, and tactile cues required  HOME EXERCISE PROGRAM: PROGRAM HOME EXERCISE PROGRAM Created by [place] Eiko Mcgowen Oct 2nd, 2024 View at [email or web address] using code: [email or web address]  Page 1 of 1 1 Exercise Supine Piriformis Stretch Lie on your back with both knees bent. Bring the knee of the affected hip toward your chest and cross your leg so that the ankle of the affected hip rests on your opposite thigh as shown. Pull the knee of the affected hip toward the opposite shoulder until a comfortable stretch is felt in the affected hip Repeat 2 Times Hold 1 Minute Complete 1 Set Perform 3 Times a Day  ASSESSMENT:  CLINICAL IMPRESSION: The patient presents to [facility] with c/o low back pain with radiation into her right buttock and posterior thigh. This came on about a month ago for no apparent reason.  She is tender to palpation with overpressure at L5-S1 and over her right Piriformis.  She has limited active lumbar flexion and this movement is painful. She has normal LE strength and LE DTR's bilaterally.  She demonstrated a positive right SLR test.  Her FOTO limitation score is a 47.08.   Patient will benefit from skilled physical therapy intervention to address pain and deficits.  OBJECTIVE IMPAIRMENTS: Abnormal gait, decreased activity tolerance, decreased strength, increased muscle spasms, postural dysfunction, and pain.   ACTIVITY LIMITATIONS: carrying, lifting, bending, standing, sleeping, and locomotion level  PARTICIPATION LIMITATIONS: meal prep, cleaning, laundry, and occupation  PERSONAL FACTORS: 1  comorbidity: fibromyalgia  are also affecting patient's functional outcome.   REHAB POTENTIAL: Excellent  CLINICAL DECISION MAKING: Stable/uncomplicated  EVALUATION COMPLEXITY: Low   GOALS:  SHORT TERM GOALS: Target date: 08/31/23.  Ind with an initial HEP. Goal status: INITIAL   LONG TERM GOALS: Target date: 09/28/23.  Ind with an advanced HEP.  Goal status: INITIAL  2.  Walk without antalgia.  Goal status: INITIAL  3.  Right hip ER strength improved to 4+ to 5/5.  Goal status: INITIAL  4.  Perform ADL's with pain not > 3/10.  Goal status: INITIAL  PLAN:  PT FREQUENCY: 2x/week  PT DURATION: 6 weeks  PLANNED INTERVENTIONS: Therapeutic exercises, Therapeutic activity, Patient/Family education, Self Care, Dry Needling, Electrical stimulation, Cryotherapy, Moist heat, Ultrasound, and Manual therapy.  PLAN FOR NEXT SESSION: Right Piriformis stretch, "Clam-Shell", SKTC, DKTC, hip bridges.  Modalities and STW/M as needed.   Hiawatha Dressel, [place], PT 08/17/2023, 10:37 AM

## 2023-08-19 ENCOUNTER — Ambulatory Visit: Payer: Medicare Other | Admitting: Physical Therapy

## 2023-08-19 DIAGNOSIS — M62838 Other muscle spasm: Secondary | ICD-10-CM

## 2023-08-19 DIAGNOSIS — M5459 Other low back pain: Secondary | ICD-10-CM | POA: Diagnosis not present

## 2023-08-19 NOTE — Therapy (Signed)
OUTPATIENT PHYSICAL THERAPY THORACOLUMBAR EVALUATION   Patient Name: Stacy Hansen MRN: [id number] DOB:[birth year], 72 y.o., female Today's Date: 08/19/2023  END OF SESSION:  PT End of Session - 08/19/23 1037     Visit Number 2    Number of Visits 12    Date for PT Re-Evaluation 09/28/23    Authorization Type FOTO.    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   Hyperplastic colon polyp    Panic attacks    Stroke [facility])    TIA (transient ischemic attack)    Tobacco abuse    Past Surgical History:  Procedure Laterality Date   CATARACT EXTRACTION Bilateral    EYE SURGERY     IR GENERIC HISTORICAL  07/28/2016   IR RADIOLOGIST EVAL & MGMT 07/28/2016 [facility]   KNEE ARTHROSCOPY WITH MEDIAL MENISECTOMY Left 12/01/2017   Procedure: LEFT KNEE ARTHROSCOPY WITH PARTIAL MEDIAL AND LATERAL MENISECTOMY;  Surgeon: Vickki Hearing, MD;  Location: [facility];  Service: Orthopedics;  Laterality: Left;   ORIF ULNAR FRACTURE Left    ROTATOR CUFF REPAIR Left    TONSILLECTOMY     TUBAL LIGATION     VAGINAL HYSTERECTOMY     has her ovaries   VARICOSE VEIN SURGERY Right    Patient Active Problem List   Diagnosis Date Noted   History of hearing loss 01/25/2023   Vocal cord nodule 01/25/2023   Tinnitus of left ear 12/14/2022   Closed fracture of middle phalanx of left ring finger 09/13/2022   Encounter for general adult medical examination with abnormal findings 09/13/2022   Productive cough 09/13/2022   Osteopenia of lumbar spine 02/27/2020   Arthritis, multiple joint involvement 12/12/2019    Nocturnal leg cramps 04/05/2019   Dyslipidemia 12/13/2017   Gastroesophageal reflux disease without esophagitis 12/13/2017   Derangement of posterior horn of medial meniscus of left knee    Derangement of anterior horn of lateral meniscus of left knee    Multiple pulmonary nodules 07/07/2016   Seasonal allergic rhinitis due to pollen 02/11/2016   Tobacco use 11/02/2014   Brain aneurysm 11/02/2014   Crohn disease (HCC) 11/02/2014   Fibromyalgia 11/02/2014   GAD (generalized anxiety disorder) 11/02/2014   Restless leg syndrome 07/10/2014   H/O: CVA (cerebrovascular accident) 07/18/2013   Insomnia 08/16/2012   Vitamin D deficiency 01/22/2011   Osteoporosis without current pathological fracture 01/21/2011   Superficial varicosities 01/21/2011   Tear film insufficiency 01/21/2011   Atrophic vaginitis 01/21/2011   Symptomatic menopausal or female climacteric states 01/21/2011   Unspecified glaucoma 01/21/2011    REFERRING PROVIDER: Fuller Canada MD  REFERRING DIAG: Lumbar pian, radicular pain of right lower extremity.  Rationale for Evaluation and Treatment: Rehabilitation  THERAPY DIAG:  Other low back pain  Other muscle spasm  ONSET DATE: ~one month.  SUBJECTIVE:  SUBJECTIVE STATEMENT: Did good after last treatment and then pain came back to an 8.  PERTINENT HISTORY:  Fibromyalgia, H/o CVA.  See above.  PAIN:  Are you having pain? Yes: NPRS scale: 8/10 Pain location: Low back, right buttock and posterior thigh. Pain description: Ache, shooting. Aggravating factors: As above. Relieving factors: As above.  PRECAUTIONS: None  RED FLAGS: None   WEIGHT BEARING RESTRICTIONS: No  FALLS:  Has patient fallen in last 6 months? No.  Fall in January 2024 due to COVID.  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  PLOF: Independent  PATIENT GOALS: Get out of pain.  NEXT MD VISIT: 09/22/23.  OBJECTIVE:  Note: Objective measures were completed at Evaluation unless otherwise noted.  DIAGNOSTIC FINDINGS:  08/11/23:  Patient shows increased lordosis at the lumbosacral junction with truncal asymmetry in the coronal plane and various levels of sclerosis around the facet joints at the lower part of the spine there is also osteophyte formation noted in the L2 area.  Impression spondylosis with abnormal alignment that does not reach the level of scoliosis                 08/11/23:  We see normal femoral heads we see perhaps some mild narrowing of the weightbearing area of the hip no osteophytes no sclerosis no cyst.  Impression mild OA of the right and left hip   PATIENT SURVEYS:  FOTO 47.08.  POSTURE: rounded shoulders, forward head, and posterior pelvic tilt  PALPATION: Tender to palpation with overpressure at L5-S1.  Very tender to palpation over right Piriformis.  LUMBAR ROM:   Active lumbar flexion limited by 50% and painful and active lumbar extension is full.  LOWER EXTREMITY ROM:     WNL.  LOWER EXTREMITY MMT:    Right hip ER decreased to 2 to 2+/5 likely decreased due to pain.  LUMBAR SPECIAL TESTS:  Positive right SLR.  Equal leg lengths.  (-) FABER testing.   Normal bilateral LE DTR's.  GAIT:  Antalgic gait.  TODAY'S TREATMENT:                                                                                                                              DATE:   08/19/23:  Nustep level 1 x 8 minutes f/b STW/M to right Piriformis region x 15 minutes f/b HMP and IFC at 80-150 Hz on 40% scan x 20 minutes to patient's right LB and Piriformis region.  Normal modality response following removal of modality.     PATIENT  EDUCATION:  Education details: See below. Person educated: Patient Education method: Explanation, Demonstration, Tactile cues, Verbal cues, and Handouts Education comprehension: verbalized understanding, returned demonstration, verbal cues required, and tactile cues required  HOME EXERCISE PROGRAM: PROGRAM HOME EXERCISE PROGRAM Created by [place] Nozomi Mettler Oct 2nd, 2024 View at [email or web address] using  code: [email or web address]  Page 1 of 1 1 Exercise Supine Piriformis Stretch Lie on your back with both knees bent. Bring the knee of the affected hip toward your chest and cross your leg so that the ankle of the affected hip rests on your opposite thigh as shown. Pull the knee of the affected hip toward the opposite shoulder until a comfortable stretch is felt in the affected hip Repeat 2 Times Hold 1 Minute Complete 1 Set Perform 3 Times a Day  ASSESSMENT:  CLINICAL IMPRESSION: Patient very tender over her right Piriformis region.  After treatment she reported a pain reduction to a 6.  OBJECTIVE IMPAIRMENTS: Abnormal gait, decreased activity tolerance, decreased strength, increased muscle spasms, postural dysfunction, and pain.   ACTIVITY LIMITATIONS: carrying, lifting, bending, standing, sleeping, and locomotion level  PARTICIPATION LIMITATIONS: meal prep, cleaning, laundry, and occupation  PERSONAL FACTORS: 1 comorbidity: fibromyalgia  are also affecting patient's functional outcome.   REHAB POTENTIAL: Excellent  CLINICAL DECISION MAKING: Stable/uncomplicated  EVALUATION COMPLEXITY: Low   GOALS:  SHORT TERM GOALS: Target date: 08/31/23.  Ind with an initial HEP. Goal status: INITIAL   LONG TERM GOALS: Target date: 09/28/23.  Ind with an advanced HEP.  Goal status: INITIAL  2.  Walk without antalgia.  Goal status: INITIAL  3.  Right hip ER strength improved to 4+ to 5/5.  Goal status: INITIAL  4.  Perform ADL's with pain not > 3/10.  Goal status:  INITIAL  PLAN:  PT FREQUENCY: 2x/week  PT DURATION: 6 weeks  PLANNED INTERVENTIONS: Therapeutic exercises, Therapeutic activity, Patient/Family education, Self Care, Dry Needling, Electrical stimulation, Cryotherapy, Moist heat, Ultrasound, and Manual therapy.  PLAN FOR NEXT SESSION: Right Piriformis stretch, "Clam-Shell", SKTC, DKTC, hip bridges.  Modalities and STW/M as needed.   Kyl Givler, [place], PT 08/19/2023, 10:45 AM

## 2023-08-24 ENCOUNTER — Encounter: Payer: Medicare Other | Admitting: Physical Therapy

## 2023-08-25 ENCOUNTER — Encounter: Payer: Medicare Other | Admitting: *Deleted

## 2023-08-26 ENCOUNTER — Encounter: Payer: Self-pay | Admitting: *Deleted

## 2023-08-26 ENCOUNTER — Ambulatory Visit: Payer: Medicare Other | Admitting: *Deleted

## 2023-08-26 DIAGNOSIS — R42 Dizziness and giddiness: Secondary | ICD-10-CM

## 2023-08-26 DIAGNOSIS — M5459 Other low back pain: Secondary | ICD-10-CM | POA: Diagnosis not present

## 2023-08-26 DIAGNOSIS — R2681 Unsteadiness on feet: Secondary | ICD-10-CM

## 2023-08-26 DIAGNOSIS — M62838 Other muscle spasm: Secondary | ICD-10-CM

## 2023-08-26 NOTE — Therapy (Signed)
OUTPATIENT PHYSICAL THERAPY THORACOLUMBAR EVALUATION   Patient Name: Stacy Hansen MRN: [id number] DOB:March 10, 1951, 72 y.o., female Today's Date: 08/26/2023  END OF SESSION:  PT End of Session - 08/26/23 0839     Visit Number 3    Number of Visits 12    Date for PT Re-Evaluation 09/28/23    Authorization Type FOTO.    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   Hyperplastic colon polyp    Panic attacks    Stroke [facility])    TIA (transient ischemic attack)    Tobacco abuse    Past Surgical History:  Procedure Laterality Date   CATARACT EXTRACTION Bilateral    EYE SURGERY     IR GENERIC HISTORICAL  07/28/2016   IR RADIOLOGIST EVAL & MGMT 07/28/2016 [facility]   KNEE ARTHROSCOPY WITH MEDIAL MENISECTOMY Left 12/01/2017   Procedure: LEFT KNEE ARTHROSCOPY WITH PARTIAL MEDIAL AND LATERAL MENISECTOMY;  Surgeon: Vickki Hearing, MD;  Location: [facility];  Service: Orthopedics;  Laterality: Left;   ORIF ULNAR FRACTURE Left    ROTATOR CUFF REPAIR Left    TONSILLECTOMY     TUBAL LIGATION     VAGINAL HYSTERECTOMY     has her ovaries   VARICOSE VEIN SURGERY Right    Patient Active Problem List   Diagnosis Date Noted   History of hearing loss 01/25/2023   Vocal cord nodule 01/25/2023   Tinnitus of left ear 12/14/2022   Closed fracture of middle phalanx of left ring finger 09/13/2022   Encounter for general adult medical examination with abnormal findings 09/13/2022   Productive cough 09/13/2022   Osteopenia of lumbar spine 02/27/2020   Arthritis, multiple joint involvement 12/12/2019   Nocturnal leg cramps 04/05/2019   Dyslipidemia 12/13/2017   Gastroesophageal reflux disease without esophagitis  12/13/2017   Derangement of posterior horn of medial meniscus of left knee    Derangement of anterior horn of lateral meniscus of left knee    Multiple pulmonary nodules 07/07/2016   Seasonal allergic rhinitis due to pollen 02/11/2016   Tobacco use 11/02/2014   Brain aneurysm 11/02/2014   Crohn disease (HCC) 11/02/2014   Fibromyalgia 11/02/2014   GAD (generalized anxiety disorder) 11/02/2014   Restless leg syndrome 07/10/2014   H/O: CVA (cerebrovascular accident) 07/18/2013   Insomnia 08/16/2012   Vitamin D deficiency 01/22/2011   Osteoporosis without current pathological fracture 01/21/2011   Superficial varicosities 01/21/2011   Tear film insufficiency 01/21/2011   Atrophic vaginitis 01/21/2011   Symptomatic menopausal or female climacteric states 01/21/2011   Unspecified glaucoma 01/21/2011    REFERRING PROVIDER: Fuller Canada MD  REFERRING DIAG: Lumbar pian, radicular pain of right lower extremity.  Rationale for Evaluation and Treatment: Rehabilitation  THERAPY DIAG:  Other low back pain  Other muscle spasm  Dizziness and giddiness  Unsteadiness on feet  ONSET DATE: ~one month.  SUBJECTIVE:  SUBJECTIVE STATEMENT: Did good after last treatment and then pain came back to an 8.  PERTINENT HISTORY:  Fibromyalgia, H/o CVA.  See above.  PAIN:  Are you having pain? Yes: NPRS scale: 8/10 Pain location: Low back, right buttock and posterior thigh. Pain description: Ache, shooting. Aggravating factors: As above. Relieving factors: As above.  PRECAUTIONS: None  RED FLAGS: None   WEIGHT BEARING RESTRICTIONS: No  FALLS:  Has patient fallen in last 6 months? No.  Fall in January 2024 due to COVID.  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  PLOF: Independent  PATIENT GOALS: Get out of pain.  NEXT MD VISIT: 09/22/23.  OBJECTIVE:  Note: Objective measures were completed at Evaluation unless otherwise noted.  DIAGNOSTIC FINDINGS:  08/11/23:  Patient shows increased lordosis at the lumbosacral junction with truncal asymmetry in the coronal plane and various levels of sclerosis around the facet joints at the lower part of the spine there is also osteophyte formation noted in the L2 area.  Impression spondylosis with abnormal alignment that does not reach the level of scoliosis                 08/11/23:  We see normal femoral heads we see perhaps some mild narrowing of the weightbearing area of the hip no osteophytes no sclerosis no cyst.  Impression mild OA of the right and left hip   PATIENT SURVEYS:  FOTO 47.08.  POSTURE: rounded shoulders, forward head, and posterior pelvic tilt  PALPATION: Tender to palpation with overpressure at L5-S1.  Very tender to palpation over right Piriformis.  LUMBAR ROM:   Active lumbar flexion limited by 50% and painful and active lumbar extension is full.  LOWER EXTREMITY ROM:     WNL.  LOWER EXTREMITY MMT:    Right hip ER decreased to 2 to 2+/5 likely decreased due to pain.  LUMBAR SPECIAL TESTS:  Positive right SLR.  Equal leg lengths.  (-) FABER testing.   Normal bilateral LE DTR's.  GAIT:  Antalgic gait.  TODAY'S TREATMENT:                                                                                                                              DATE:                                                                 08/26/23:   TENS machine instructions Side lying hip IR/ER 2x10, side lying hip ABD x 10 and discussed HEP [place] combo x 10 mins  HMP /IFC at 80-150 Hz on 40% scan x 20 minutes to patient's right  LB and Piriformis region.  Normal modality response following  removal of modality.     PATIENT EDUCATION:  Education details: See below. Person educated: Patient Education method: Explanation, Demonstration, Tactile cues, Verbal cues, and Handouts Education comprehension: verbalized understanding, returned demonstration, verbal cues required, and tactile cues required  HOME EXERCISE PROGRAM: PROGRAM HOME EXERCISE PROGRAM Created by [place] Applegate Oct 2nd, 2024 View at [email or web address] using code: [email or web address]  Page 1 of 1 1 Exercise Supine Piriformis Stretch Lie on your back with both knees bent. Bring the knee of the affected hip toward your chest and cross your leg so that the ankle of the affected hip rests on your opposite thigh as shown. Pull the knee of the affected hip toward the opposite shoulder until a comfortable stretch is felt in the affected hip Repeat 2 Times Hold 1 Minute Complete 1 Set Perform 3 Times a Day  ASSESSMENT:  CLINICAL IMPRESSION: Pt arrived today doing fair with RT hip pain. Rx focused on hip therex as well as [place] combo and IFC to RT piriformis area. Pt felt some soreness with exs, but very tolerable as per Pt. Decreased pain end of session. Handout given for HEP.    OBJECTIVE IMPAIRMENTS: Abnormal gait, decreased activity tolerance, decreased strength, increased muscle spasms, postural dysfunction, and pain.   ACTIVITY LIMITATIONS: carrying, lifting, bending, standing, sleeping, and locomotion level  PARTICIPATION LIMITATIONS: meal prep, cleaning, laundry, and occupation  PERSONAL FACTORS: 1 comorbidity: fibromyalgia  are also affecting patient's functional outcome.   REHAB POTENTIAL: Excellent  CLINICAL DECISION MAKING: Stable/uncomplicated  EVALUATION COMPLEXITY: Low   GOALS:  SHORT TERM GOALS: Target date: 08/31/23.  Ind with an initial HEP. Goal status: INITIAL   LONG TERM GOALS: Target date: 09/28/23.  Ind with an advanced HEP.  Goal status: INITIAL  2.  Walk without antalgia.   Goal status: INITIAL  3.  Right hip ER strength improved to 4+ to 5/5.  Goal status: INITIAL  4.  Perform ADL's with pain not > 3/10.  Goal status: INITIAL  PLAN:  PT FREQUENCY: 2x/week  PT DURATION: 6 weeks  PLANNED INTERVENTIONS: Therapeutic exercises, Therapeutic activity, Patient/Family education, Self Care, Dry Needling, Electrical stimulation, Cryotherapy, Moist heat, Ultrasound, and Manual therapy.  PLAN FOR NEXT SESSION: Right Piriformis stretch, "Clam-Shell", SKTC, DKTC, hip bridges.  Modalities and STW/M as needed.   Letishia Elliott,CHRIS, PTA 08/26/2023, 1:04 PM

## 2023-08-31 ENCOUNTER — Ambulatory Visit: Payer: Medicare Other | Admitting: Physical Therapy

## 2023-08-31 DIAGNOSIS — M5459 Other low back pain: Secondary | ICD-10-CM

## 2023-08-31 DIAGNOSIS — M62838 Other muscle spasm: Secondary | ICD-10-CM

## 2023-08-31 NOTE — Therapy (Signed)
OUTPATIENT PHYSICAL THERAPY THORACOLUMBAR EVALUATION   Patient Name: Stacy Hansen MRN: [id number] DOB:1951-06-29, 72 y.o., female Today's Date: 08/31/2023  END OF SESSION:  PT End of Session - 08/31/23 0928     Visit Number 4    Date for PT Re-Evaluation 09/28/23    Authorization Type FOTO.    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   Hyperplastic colon polyp    Panic attacks    Stroke [facility])    TIA (transient ischemic attack)    Tobacco abuse    Past Surgical History:  Procedure Laterality Date   CATARACT EXTRACTION Bilateral    EYE SURGERY     IR GENERIC HISTORICAL  07/28/2016   IR RADIOLOGIST EVAL & MGMT 07/28/2016 [facility]   KNEE ARTHROSCOPY WITH MEDIAL MENISECTOMY Left 12/01/2017   Procedure: LEFT KNEE ARTHROSCOPY WITH PARTIAL MEDIAL AND LATERAL MENISECTOMY;  Surgeon: Vickki Hearing, MD;  Location: [facility];  Service: Orthopedics;  Laterality: Left;   ORIF ULNAR FRACTURE Left    ROTATOR CUFF REPAIR Left    TONSILLECTOMY     TUBAL LIGATION     VAGINAL HYSTERECTOMY     has her ovaries   VARICOSE VEIN SURGERY Right    Patient Active Problem List   Diagnosis Date Noted   History of hearing loss 01/25/2023   Vocal cord nodule 01/25/2023   Tinnitus of left ear 12/14/2022   Closed fracture of middle phalanx of left ring finger 09/13/2022   Encounter for general adult medical examination with abnormal findings 09/13/2022   Productive cough 09/13/2022   Osteopenia of lumbar spine 02/27/2020   Arthritis, multiple joint involvement 12/12/2019   Nocturnal leg cramps  04/05/2019   Dyslipidemia 12/13/2017   Gastroesophageal reflux disease without esophagitis 12/13/2017   Derangement of posterior horn of medial meniscus of left knee    Derangement of anterior horn of lateral meniscus of left knee    Multiple pulmonary nodules 07/07/2016   Seasonal allergic rhinitis due to pollen 02/11/2016   Tobacco use 11/02/2014   Brain aneurysm 11/02/2014   Crohn disease (HCC) 11/02/2014   Fibromyalgia 11/02/2014   GAD (generalized anxiety disorder) 11/02/2014   Restless leg syndrome 07/10/2014   H/O: CVA (cerebrovascular accident) 07/18/2013   Insomnia 08/16/2012   Vitamin D deficiency 01/22/2011   Osteoporosis without current pathological fracture 01/21/2011   Superficial varicosities 01/21/2011   Tear film insufficiency 01/21/2011   Atrophic vaginitis 01/21/2011   Symptomatic menopausal or female climacteric states 01/21/2011   Unspecified glaucoma 01/21/2011    REFERRING PROVIDER: Fuller Canada MD  REFERRING DIAG: Lumbar pain, radicular pain of right lower extremity.  Rationale for Evaluation and Treatment: Rehabilitation  THERAPY DIAG:  Other low back pain  Other muscle spasm  ONSET DATE: ~one month.  SUBJECTIVE:  SUBJECTIVE STATEMENT: About 20% better.  Pain is a 6.5. PERTINENT HISTORY:  Fibromyalgia, H/o CVA.  See above.  PAIN:  Are you having pain? Yes: NPRS scale: 6.5/10 Pain location: Low back, right buttock and posterior thigh. Pain description: Ache, shooting. Aggravating factors: As above. Relieving factors: As above.  PRECAUTIONS: None  RED FLAGS: None   WEIGHT BEARING RESTRICTIONS: No  FALLS:  Has patient fallen in last 6 months? No.  Fall in January 2024 due to COVID.  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  PLOF: Independent  PATIENT GOALS: Get out of pain.  NEXT MD VISIT: 09/22/23.  OBJECTIVE:  Note: Objective measures were completed at Evaluation unless otherwise noted.  DIAGNOSTIC FINDINGS:  08/11/23:  Patient shows increased lordosis at the lumbosacral junction with truncal asymmetry in the coronal plane and various levels of sclerosis around the facet joints at the lower part of the spine there is also osteophyte formation noted in the L2 area.  Impression spondylosis with abnormal alignment that does not reach the level of scoliosis                 08/11/23:  We see normal femoral heads we see perhaps some mild narrowing of the weightbearing area of the hip no osteophytes no sclerosis no cyst.  Impression mild OA of the right and left hip   PATIENT SURVEYS:  FOTO 47.08.  POSTURE: rounded shoulders, forward head, and posterior pelvic tilt  PALPATION: Tender to palpation with overpressure at L5-S1.  Very tender to palpation over right Piriformis.  LUMBAR ROM:   Active lumbar flexion limited by 50% and painful and active lumbar extension is full.  LOWER EXTREMITY ROM:     WNL.  LOWER EXTREMITY MMT:    Right hip ER decreased to 2 to 2+/5 likely decreased due to pain.  LUMBAR SPECIAL TESTS:  Positive right SLR.  Equal leg lengths.  (-) FABER testing.   Normal bilateral LE DTR's.  GAIT:  Antalgic gait.  TODAY'S TREATMENT:                                                                                                                              DATE:    08/31/23:  Patient in left SDLY position with folded pillow between  knees for comfort:  Combo e'stim/US at 1.50 W/CM2 x 12 minutes to patient's left Piriformis region f/b STW/M x 11 minutes with ischemic release technique utilized f/b  HMP and IFC at 80-150 Hz on 40% scan x 15 minutes.  Normal modality response  following removal of modality.  08/26/23:   TENS machine instructions Side lying hip IR/ER 2x10, side lying hip ABD x 10 and discussed HEP [place] combo x 10 mins  HMP /IFC at 80-150 Hz on 40% scan x 20 minutes to patient's right LB and Piriformis region.  Normal modality response following removal of modality.     PATIENT EDUCATION:  Education details: See below. Person educated: Patient Education method: Explanation, Demonstration, Tactile cues, Verbal cues, and Handouts Education comprehension: verbalized understanding, returned demonstration, verbal cues required, and tactile cues required  HOME EXERCISE PROGRAM: PROGRAM HOME EXERCISE PROGRAM Created by [place] Alyric Parkin Oct 2nd, 2024 View at [email or web address] using code: [email or web address]  Page 1 of 1 1 Exercise Supine Piriformis Stretch Lie on your back with both knees bent. Bring the knee of the affected hip toward your chest and cross your leg so that the ankle of the affected hip rests on your opposite thigh as shown. Pull the knee of the affected hip toward the opposite shoulder until a comfortable stretch is felt in the affected hip Repeat 2 Times Hold 1 Minute Complete 1 Set Perform 3 Times a Day  ASSESSMENT:  CLINICAL IMPRESSION: Patient reporting less pain and improvement rated at "20%."  She remains tender in the right Piriformis and proximal glut musculature but less than initially.   OBJECTIVE IMPAIRMENTS: Abnormal gait, decreased activity tolerance, decreased strength, increased muscle spasms, postural dysfunction, and pain.   ACTIVITY LIMITATIONS: carrying, lifting, bending, standing, sleeping, and locomotion level  PARTICIPATION LIMITATIONS: meal prep, cleaning, laundry, and occupation  PERSONAL FACTORS: 1 comorbidity: fibromyalgia  are also affecting patient's functional outcome.   REHAB POTENTIAL: Excellent  CLINICAL DECISION MAKING:  Stable/uncomplicated  EVALUATION COMPLEXITY: Low   GOALS:  SHORT TERM GOALS: Target date: 08/31/23.  Ind with an initial HEP. Goal status: INITIAL   LONG TERM GOALS: Target date: 09/28/23.  Ind with an advanced HEP.  Goal status: INITIAL  2.  Walk without antalgia.  Goal status: INITIAL  3.  Right hip ER strength improved to 4+ to 5/5.  Goal status: INITIAL  4.  Perform ADL's with pain not > 3/10.  Goal status: INITIAL  PLAN:  PT FREQUENCY: 2x/week  PT DURATION: 6 weeks  PLANNED INTERVENTIONS: Therapeutic exercises, Therapeutic activity, Patient/Family education, Self Care, Dry Needling, Electrical stimulation, Cryotherapy, Moist heat, Ultrasound, and Manual therapy.  PLAN FOR NEXT SESSION: Right Piriformis stretch, "Clam-Shell", SKTC, DKTC, hip bridges.  Modalities and STW/M as needed.   Dayan Desa, [place], PT 08/31/2023, 9:57 AM

## 2023-09-01 ENCOUNTER — Encounter: Payer: Medicare Other | Admitting: Physical Therapy

## 2023-09-02 ENCOUNTER — Encounter: Payer: Medicare Other | Admitting: *Deleted

## 2023-09-08 ENCOUNTER — Ambulatory Visit: Payer: Medicare Other | Admitting: *Deleted

## 2023-09-08 ENCOUNTER — Encounter: Payer: Self-pay | Admitting: *Deleted

## 2023-09-08 DIAGNOSIS — M5459 Other low back pain: Secondary | ICD-10-CM

## 2023-09-08 DIAGNOSIS — M62838 Other muscle spasm: Secondary | ICD-10-CM

## 2023-09-08 NOTE — Therapy (Signed)
OUTPATIENT PHYSICAL THERAPY THORACOLUMBAR TREATMENT   Patient Name: Stacy Hansen MRN: [id number] DOB:Oct 22, 1951, 72 y.o., female Today's Date: 09/08/2023  END OF SESSION:  PT End of Session - 09/08/23 0848     Visit Number 5    Number of Visits 12    Date for PT Re-Evaluation 09/28/23    Authorization Type FOTO.    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   Hyperplastic colon polyp    Panic attacks    Stroke [facility])    TIA (transient ischemic attack)    Tobacco abuse    Past Surgical History:  Procedure Laterality Date   CATARACT EXTRACTION Bilateral    EYE SURGERY     IR GENERIC HISTORICAL  07/28/2016   IR RADIOLOGIST EVAL & MGMT 07/28/2016 [facility]   KNEE ARTHROSCOPY WITH MEDIAL MENISECTOMY Left 12/01/2017   Procedure: LEFT KNEE ARTHROSCOPY WITH PARTIAL MEDIAL AND LATERAL MENISECTOMY;  Surgeon: Vickki Hearing, MD;  Location: [facility];  Service: Orthopedics;  Laterality: Left;   ORIF ULNAR FRACTURE Left    ROTATOR CUFF REPAIR Left    TONSILLECTOMY     TUBAL LIGATION     VAGINAL HYSTERECTOMY     has her ovaries   VARICOSE VEIN SURGERY Right    Patient Active Problem List   Diagnosis Date Noted   History of hearing loss 01/25/2023   Vocal cord nodule 01/25/2023   Tinnitus of left ear 12/14/2022   Closed fracture of middle phalanx of left ring finger 09/13/2022   Encounter for general adult medical examination with abnormal findings 09/13/2022   Productive cough 09/13/2022   Osteopenia of lumbar spine 02/27/2020   Arthritis, multiple joint involvement 12/12/2019   Nocturnal leg cramps 04/05/2019   Dyslipidemia 12/13/2017   Gastroesophageal reflux disease without esophagitis  12/13/2017   Derangement of posterior horn of medial meniscus of left knee    Derangement of anterior horn of lateral meniscus of left knee    Multiple pulmonary nodules 07/07/2016   Seasonal allergic rhinitis due to pollen 02/11/2016   Tobacco use 11/02/2014   Brain aneurysm 11/02/2014   Crohn disease (HCC) 11/02/2014   Fibromyalgia 11/02/2014   GAD (generalized anxiety disorder) 11/02/2014   Restless leg syndrome 07/10/2014   H/O: CVA (cerebrovascular accident) 07/18/2013   Insomnia 08/16/2012   Vitamin D deficiency 01/22/2011   Osteoporosis without current pathological fracture 01/21/2011   Superficial varicosities 01/21/2011   Tear film insufficiency 01/21/2011   Atrophic vaginitis 01/21/2011   Symptomatic menopausal or female climacteric states 01/21/2011   Unspecified glaucoma 01/21/2011    REFERRING PROVIDER: Fuller Canada MD  REFERRING DIAG: Lumbar pain, radicular pain of right lower extremity.  Rationale for Evaluation and Treatment: Rehabilitation  THERAPY DIAG:  Other low back pain  Other muscle spasm  ONSET DATE: ~one month.  SUBJECTIVE:  SUBJECTIVE STATEMENT:  Doing better today 5.5/ 10   RT hip    PERTINENT HISTORY:  Fibromyalgia, H/o CVA.  See above.  PAIN:  Are you having pain? Yes: NPRS scale: 6.5/10 Pain location: Low back, right buttock and posterior thigh. Pain description: Ache, shooting. Aggravating factors: As above. Relieving factors: As above.  PRECAUTIONS: None  RED FLAGS: None   WEIGHT BEARING RESTRICTIONS: No  FALLS:  Has patient fallen in last 6 months? No.  Fall in January 2024 due to COVID.  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  PLOF: Independent  PATIENT GOALS: Get out of pain.  NEXT MD VISIT: 09/22/23.  OBJECTIVE:  Note: Objective measures were completed at Evaluation unless otherwise noted.  DIAGNOSTIC FINDINGS:  08/11/23:  Patient shows increased lordosis at the lumbosacral junction with truncal asymmetry in the coronal plane and various levels of sclerosis around the facet joints at the lower part of the spine there is also osteophyte formation noted in the L2 area.  Impression spondylosis with abnormal alignment that does not reach the level of scoliosis                 08/11/23:  We see normal femoral heads we see perhaps some mild narrowing of the weightbearing area of the hip no osteophytes no sclerosis no cyst.  Impression mild OA of the right and left hip   PATIENT SURVEYS:  FOTO 47.08.  POSTURE: rounded shoulders, forward head, and posterior pelvic tilt  PALPATION: Tender to palpation with overpressure at L5-S1.  Very tender to palpation over right Piriformis.  LUMBAR ROM:   Active lumbar flexion limited by 50% and painful and active lumbar extension is full.  LOWER EXTREMITY ROM:     WNL.  LOWER EXTREMITY MMT:    Right hip ER decreased to 2 to 2+/5 likely decreased due to pain.  LUMBAR SPECIAL TESTS:  Positive right SLR.  Equal leg lengths.  (-) FABER testing.   Normal bilateral LE DTR's.  GAIT:  Antalgic gait.  TODAY'S TREATMENT:                                                                                                                              DATE:    09/08/23:    Nustep x 6 mins L3   Patient in left SDLY position with folded pillow between  knees for comfort:  Combo e'stim/US at 1.50 W/CM2 x 10 minutes to patient's left Piriformis region f/b  STW/M x with ischemic release technique utilized  HMP and IFC at 80-150 Hz on 40% scan x 15 minutes.  Normal modality response following removal of modality.  08/26/23:   TENS machine instructions Side lying hip IR/ER 2x10, side lying hip ABD x 10 and discussed HEP [place] combo x 10 mins  HMP /IFC at 80-150 Hz on 40% scan x 20 minutes to patient's right LB and Piriformis region.  Normal modality response following removal of modality.     PATIENT EDUCATION:  Education details: See below. Person educated: Patient Education method: Explanation, Demonstration, Tactile cues, Verbal cues, and Handouts Education comprehension: verbalized understanding, returned demonstration, verbal cues required, and tactile cues required  HOME EXERCISE PROGRAM: PROGRAM HOME EXERCISE PROGRAM Created by [place] Applegate Oct 2nd, 2024 View at [email or web address] using code: [email or web address]  Page 1 of 1 1 Exercise Supine Piriformis Stretch Lie on your back with both knees bent. Bring the knee of the affected hip toward your chest and cross your leg so that the ankle of the affected hip rests on your opposite thigh as shown. Pull the knee of the affected hip toward the opposite shoulder until a comfortable stretch is felt in the affected hip Repeat 2 Times Hold 1 Minute Complete 1 Set Perform 3 Times a Day  ASSESSMENT:  CLINICAL IMPRESSION: Patient arrived today doing better with some decreased pain and soreness in RT hip/glute with  improvement rated at 20%. Still with tenderness  in the right Piriformis and proximal glute musculature ,but less than initially and felt good after Rx.   OBJECTIVE IMPAIRMENTS: Abnormal gait, decreased activity tolerance, decreased strength, increased muscle spasms, postural dysfunction, and pain.   ACTIVITY LIMITATIONS: carrying, lifting, bending, standing, sleeping, and locomotion level  PARTICIPATION LIMITATIONS: meal prep, cleaning, laundry, and occupation  PERSONAL FACTORS: 1 comorbidity: fibromyalgia  are also affecting patient's functional outcome.   REHAB POTENTIAL: Excellent  CLINICAL  DECISION MAKING: Stable/uncomplicated  EVALUATION COMPLEXITY: Low   GOALS:  SHORT TERM GOALS: Target date: 08/31/23.  Ind with an initial HEP. Goal status: On going   LONG TERM GOALS: Target date: 09/28/23.  Ind with an advanced HEP.  Goal status: MET  2.  Walk without antalgia.  Goal status: On going  3.  Right hip ER strength improved to 4+ to 5/5.  Goal status: On going  4.  Perform ADL's with pain not > 3/10.  Goal status: On going  PLAN:  PT FREQUENCY: 2x/week  PT DURATION: 6 weeks  PLANNED INTERVENTIONS: Therapeutic exercises, Therapeutic activity, Patient/Family education, Self Care, Dry Needling, Electrical stimulation, Cryotherapy, Moist heat, Ultrasound, and Manual therapy.  PLAN FOR NEXT SESSION: Right Piriformis stretch, "Clam-Shell", SKTC, DKTC, hip bridges.  Modalities and STW/M as needed.   Marki Frede,CHRIS, PTA 09/08/2023, 1:03 PM

## 2023-09-09 ENCOUNTER — Encounter: Payer: Self-pay | Admitting: *Deleted

## 2023-09-09 ENCOUNTER — Ambulatory Visit: Payer: Medicare Other | Admitting: *Deleted

## 2023-09-09 DIAGNOSIS — M5459 Other low back pain: Secondary | ICD-10-CM

## 2023-09-09 DIAGNOSIS — M62838 Other muscle spasm: Secondary | ICD-10-CM

## 2023-09-09 NOTE — Therapy (Signed)
OUTPATIENT PHYSICAL THERAPY THORACOLUMBAR TREATMENT   Patient Name: Stacy Hansen MRN: [id number] DOB:07/02/1951, 72 y.o., female Today's Date: 09/09/2023  END OF SESSION:  PT End of Session - 09/09/23 0851     Visit Number 6    Number of Visits 12    Date for PT Re-Evaluation 09/28/23    Authorization Type FOTO.    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   Hyperplastic colon polyp    Panic attacks    Stroke [facility])    TIA (transient ischemic attack)    Tobacco abuse    Past Surgical History:  Procedure Laterality Date   CATARACT EXTRACTION Bilateral    EYE SURGERY     IR GENERIC HISTORICAL  07/28/2016   IR RADIOLOGIST EVAL & MGMT 07/28/2016 [facility]   KNEE ARTHROSCOPY WITH MEDIAL MENISECTOMY Left 12/01/2017   Procedure: LEFT KNEE ARTHROSCOPY WITH PARTIAL MEDIAL AND LATERAL MENISECTOMY;  Surgeon: Vickki Hearing, MD;  Location: [facility];  Service: Orthopedics;  Laterality: Left;   ORIF ULNAR FRACTURE Left    ROTATOR CUFF REPAIR Left    TONSILLECTOMY     TUBAL LIGATION     VAGINAL HYSTERECTOMY     has her ovaries   VARICOSE VEIN SURGERY Right    Patient Active Problem List   Diagnosis Date Noted   History of hearing loss 01/25/2023   Vocal cord nodule 01/25/2023   Tinnitus of left ear 12/14/2022   Closed fracture of middle phalanx of left ring finger 09/13/2022   Encounter for general adult medical examination with abnormal findings 09/13/2022   Productive cough 09/13/2022   Osteopenia of lumbar spine 02/27/2020   Arthritis, multiple joint involvement 12/12/2019   Nocturnal leg cramps 04/05/2019   Dyslipidemia 12/13/2017   Gastroesophageal reflux disease without esophagitis  12/13/2017   Derangement of posterior horn of medial meniscus of left knee    Derangement of anterior horn of lateral meniscus of left knee    Multiple pulmonary nodules 07/07/2016   Seasonal allergic rhinitis due to pollen 02/11/2016   Tobacco use 11/02/2014   Brain aneurysm 11/02/2014   Crohn disease (HCC) 11/02/2014   Fibromyalgia 11/02/2014   GAD (generalized anxiety disorder) 11/02/2014   Restless leg syndrome 07/10/2014   H/O: CVA (cerebrovascular accident) 07/18/2013   Insomnia 08/16/2012   Vitamin D deficiency 01/22/2011   Osteoporosis without current pathological fracture 01/21/2011   Superficial varicosities 01/21/2011   Tear film insufficiency 01/21/2011   Atrophic vaginitis 01/21/2011   Symptomatic menopausal or female climacteric states 01/21/2011   Unspecified glaucoma 01/21/2011    REFERRING PROVIDER: Fuller Canada MD  REFERRING DIAG: Lumbar pain, radicular pain of right lower extremity.  Rationale for Evaluation and Treatment: Rehabilitation  THERAPY DIAG:  Other low back pain  Other muscle spasm  ONSET DATE: ~one month.  SUBJECTIVE:  SUBJECTIVE STATEMENT:  Doing better today 5/ 10   RT hip. 30-40% better since starting PT. Need to leave early    PERTINENT HISTORY:  Fibromyalgia, H/o CVA.  See above.  PAIN:  Are you having pain? Yes: NPRS scale: 5/10 Pain location: Low back, right buttock and posterior thigh. Pain description: Ache, shooting. Aggravating factors: As above. Relieving factors: As above.  PRECAUTIONS: None  RED FLAGS: None   WEIGHT BEARING RESTRICTIONS: No  FALLS:  Has patient fallen in last 6 months? No.  Fall in January 2024 due to COVID.  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  PLOF: Independent  PATIENT GOALS: Get out of pain.  NEXT MD VISIT: 09/22/23.  OBJECTIVE:  Note: Objective measures were completed at Evaluation unless otherwise noted.  DIAGNOSTIC FINDINGS:  08/11/23:  Patient shows increased lordosis at the lumbosacral junction with truncal asymmetry in the coronal plane and various levels of sclerosis around the facet joints at the lower part of the spine there is also osteophyte formation noted in the L2 area.  Impression spondylosis with abnormal alignment that does not reach the level of scoliosis                 08/11/23:  We see normal femoral heads we see perhaps some mild narrowing of the weightbearing area of the hip no osteophytes no sclerosis no cyst.  Impression mild OA of the right and left hip   PATIENT SURVEYS:  FOTO 47.08.  POSTURE: rounded shoulders, forward head, and posterior pelvic tilt  PALPATION: Tender to palpation with overpressure at L5-S1.  Very tender to palpation over right Piriformis.  LUMBAR ROM:   Active lumbar flexion limited by 50% and painful and active lumbar extension is full.  LOWER EXTREMITY ROM:     WNL.  LOWER EXTREMITY MMT:    Right hip ER decreased to 2 to 2+/5 likely decreased due to pain.  LUMBAR SPECIAL TESTS:  Positive right SLR.  Equal leg lengths.  (-) FABER testing.   Normal bilateral LE DTR's.  GAIT:  Antalgic gait.  TODAY'S TREATMENT:                                                                                                                              DATE:    09/09/23:    Nustep x 7 mins L3   Patient in left SDLY position with folded pillow between  knees for comfort:  Combo e'stim/US at 1.50 W/CM2 x 10 minutes to patient's RT Piriformis region  STW/M x 10 minutes with ischemic release technique utilized to RT glute and piriformis  08/26/23:   TENS machine instructions Side lying hip IR/ER 2x10, side lying hip ABD x 10 and discussed HEP [place] combo x 10 mins  HMP /IFC at 80-150 Hz on 40% scan x 20 minutes to patient's right LB and Piriformis region.  Normal modality response following removal of modality.     PATIENT EDUCATION:  Education details: See below. Person educated: Patient Education method: Explanation, Demonstration, Tactile cues, Verbal cues, and Handouts Education comprehension: verbalized understanding, returned demonstration, verbal cues required, and tactile cues required  HOME EXERCISE PROGRAM: PROGRAM HOME EXERCISE PROGRAM Created by [place] Applegate Oct 2nd, 2024 View at [email or web address] using code: [email or web address]  Page 1 of 1 1 Exercise Supine Piriformis Stretch Lie on your back with both knees bent. Bring the knee of the affected hip toward your chest and cross your leg so that the ankle of the affected hip rests on your opposite thigh as shown. Pull the knee of the affected hip toward the opposite shoulder until a comfortable stretch is felt in the affected hip Repeat 2 Times Hold 1 Minute Complete 1 Set Perform 3 Times a Day  ASSESSMENT:  CLINICAL IMPRESSION:FOTO performed   Patient arrived today doing better with some decreased pain and soreness in RT hip/glute with  improvement rated at 30-40% today. She also repports needing to leave early today. Still with some tenderness  in the right Piriformis and proximal glute musculature ,but less than initially and felt good after Rx.   OBJECTIVE IMPAIRMENTS: Abnormal gait, decreased activity tolerance, decreased strength, increased muscle spasms, postural dysfunction, and pain.   ACTIVITY LIMITATIONS: carrying, lifting, bending, standing, sleeping, and locomotion level  PARTICIPATION LIMITATIONS: meal prep, cleaning, laundry, and occupation  PERSONAL FACTORS: 1 comorbidity: fibromyalgia  are also affecting patient's functional  outcome.   REHAB POTENTIAL: Excellent  CLINICAL DECISION MAKING: Stable/uncomplicated  EVALUATION COMPLEXITY: Low   GOALS:  SHORT TERM GOALS: Target date: 08/31/23.  Ind with an initial HEP. Goal status: On going   LONG TERM GOALS: Target date: 09/28/23.  Ind with an advanced HEP.  Goal status: MET  2.  Walk without antalgia.  Goal status: Partially met 40% better  3.  Right hip ER strength improved to 4+ to 5/5.  Goal status: On going  4.  Perform ADL's with pain not > 3/10.  Goal status: On going  PLAN:  PT FREQUENCY: 2x/week  PT DURATION: 6 weeks  PLANNED INTERVENTIONS: Therapeutic exercises, Therapeutic activity, Patient/Family education, Self Care, Dry Needling, Electrical stimulation, Cryotherapy, Moist heat, Ultrasound, and Manual therapy.  PLAN FOR NEXT SESSION: Right Piriformis stretch, "Clam-Shell", SKTC, DKTC, hip bridges.  Modalities and STW/M as needed.   Loneta Tamplin,CHRIS, PTA 09/09/2023, 9:38 AM

## 2023-09-22 ENCOUNTER — Ambulatory Visit: Payer: Medicare Other | Admitting: Orthopedic Surgery

## 2023-09-23 ENCOUNTER — Ambulatory Visit: Payer: Medicare Other | Attending: Orthopedic Surgery | Admitting: Physical Therapy

## 2023-09-23 DIAGNOSIS — M62838 Other muscle spasm: Secondary | ICD-10-CM | POA: Insufficient documentation

## 2023-09-23 DIAGNOSIS — M5459 Other low back pain: Secondary | ICD-10-CM | POA: Diagnosis present

## 2023-09-23 NOTE — Therapy (Signed)
OUTPATIENT PHYSICAL THERAPY THORACOLUMBAR TREATMENT   Patient Name: Stacy Hansen MRN: [id number] DOB:1950-12-11, 72 y.o., female Today's Date: 09/23/2023  END OF SESSION:  PT End of Session - 09/23/23 1231     Visit Number 7    Number of Visits 12    Date for PT Re-Evaluation 09/28/23    Authorization Type FOTO.    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   Hyperplastic colon polyp    Panic attacks    Stroke [facility])    TIA (transient ischemic attack)    Tobacco abuse    Past Surgical History:  Procedure Laterality Date   CATARACT EXTRACTION Bilateral    EYE SURGERY     IR GENERIC HISTORICAL  07/28/2016   IR RADIOLOGIST EVAL & MGMT 07/28/2016 [facility]   KNEE ARTHROSCOPY WITH MEDIAL MENISECTOMY Left 12/01/2017   Procedure: LEFT KNEE ARTHROSCOPY WITH PARTIAL MEDIAL AND LATERAL MENISECTOMY;  Surgeon: Vickki Hearing, MD;  Location: [facility];  Service: Orthopedics;  Laterality: Left;   ORIF ULNAR FRACTURE Left    ROTATOR CUFF REPAIR Left    TONSILLECTOMY     TUBAL LIGATION     VAGINAL HYSTERECTOMY     has her ovaries   VARICOSE VEIN SURGERY Right    Patient Active Problem List   Diagnosis Date Noted   History of hearing loss 01/25/2023   Vocal cord nodule 01/25/2023   Tinnitus of left ear 12/14/2022   Closed fracture of middle phalanx of left ring finger 09/13/2022   Encounter for general adult medical examination with abnormal findings 09/13/2022   Productive cough 09/13/2022   Osteopenia of lumbar spine 02/27/2020   Arthritis, multiple joint involvement 12/12/2019    Nocturnal leg cramps 04/05/2019   Dyslipidemia 12/13/2017   Gastroesophageal reflux disease without esophagitis 12/13/2017   Derangement of posterior horn of medial meniscus of left knee    Derangement of anterior horn of lateral meniscus of left knee    Multiple pulmonary nodules 07/07/2016   Seasonal allergic rhinitis due to pollen 02/11/2016   Tobacco use 11/02/2014   Brain aneurysm 11/02/2014   Crohn disease (HCC) 11/02/2014   Fibromyalgia 11/02/2014   GAD (generalized anxiety disorder) 11/02/2014   Restless leg syndrome 07/10/2014   H/O: CVA (cerebrovascular accident) 07/18/2013   Insomnia 08/16/2012   Vitamin D deficiency 01/22/2011   Osteoporosis without current pathological fracture 01/21/2011   Superficial varicosities 01/21/2011   Tear film insufficiency 01/21/2011   Atrophic vaginitis 01/21/2011   Symptomatic menopausal or female climacteric states 01/21/2011   Unspecified glaucoma 01/21/2011    REFERRING PROVIDER: Fuller Canada MD  REFERRING DIAG: Lumbar pain, radicular pain of right lower extremity.  Rationale for Evaluation and Treatment: Rehabilitation  THERAPY DIAG:  Other low back pain  Other muscle spasm  ONSET DATE: ~one month.  SUBJECTIVE:  SUBJECTIVE STATEMENT: Much better.  Just one small spot hurts.  Been using TENS unit.    PERTINENT HISTORY:  Fibromyalgia, H/o CVA.  See above.  PAIN:  Are you having pain? Yes: NPRS scale: 1-2/10 Pain location: Low back, right buttock and posterior thigh. Pain description: Ache, shooting. Aggravating factors: As above. Relieving factors: As above.  PRECAUTIONS: None  RED FLAGS: None   WEIGHT BEARING RESTRICTIONS: No  FALLS:  Has patient fallen in last 6 months? No.  Fall in January 2024 due to COVID.  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  PLOF: Independent  PATIENT GOALS: Get out of pain.  NEXT MD VISIT: 09/22/23.  OBJECTIVE:  Note: Objective measures were completed at Evaluation unless otherwise noted.  DIAGNOSTIC FINDINGS:  08/11/23:  Patient shows increased lordosis at the lumbosacral junction with truncal asymmetry in the coronal plane and various levels of sclerosis around the facet joints at the lower part of the spine there is also osteophyte formation noted in the L2 area.  Impression spondylosis with abnormal alignment that does not reach the level of scoliosis                 08/11/23:  We see normal femoral heads we see perhaps some mild narrowing of the weightbearing area of the hip no osteophytes no sclerosis no cyst.  Impression mild OA of the right and left hip   PATIENT SURVEYS:  FOTO 47.08.  POSTURE: rounded shoulders, forward head, and posterior pelvic tilt  PALPATION: Tender to palpation with overpressure at L5-S1.  Very tender to palpation over right Piriformis.  LUMBAR ROM:   Active lumbar flexion limited by 50% and painful and active lumbar extension is full.  LOWER EXTREMITY ROM:     WNL.  LOWER EXTREMITY MMT:    Right hip ER decreased to 2 to 2+/5 likely decreased due to pain.  LUMBAR SPECIAL TESTS:  Positive right SLR.  Equal leg lengths.  (-) FABER testing.   Normal bilateral LE DTR's.  GAIT:  Antalgic gait.  TODAY'S TREATMENT:                                                                                                                              DATE:   09/22/23  Nustep level 3 x 7 minutes f/b combo e'stim/US at 1.50 W/CM2 x 10 minutes to region of right Piriformis region f/b STW/M x 7 minutes.    09/09/23:    Nustep x 7 mins L3   Patient in left SDLY position with folded pillow between  knees for  comfort:  Combo e'stim/US at 1.50 W/CM2 x 10 minutes to patient's RT Piriformis region  STW/M x 10 minutes with ischemic release technique utilized to RT glute and piriformis  08/26/23:   TENS machine instructions Side lying hip IR/ER 2x10, side lying hip ABD x 10 and discussed HEP [place] combo x 10 mins  HMP /IFC at 80-150 Hz on 40% scan x 20 minutes to patient's right LB and Piriformis region.  Normal modality response following removal of modality.     PATIENT EDUCATION:  Education details: See below. Person educated: Patient Education method: Explanation, Demonstration, Tactile cues, Verbal cues, and Handouts Education comprehension: verbalized understanding, returned demonstration, verbal cues required, and tactile cues required  HOME EXERCISE PROGRAM: PROGRAM HOME EXERCISE PROGRAM Created by [place] Russie Gulledge Oct 2nd, 2024 View at [email or web address] using code: [email or web address]  Page 1 of 1 1 Exercise Supine Piriformis Stretch Lie on your back with both knees bent. Bring the knee of the affected hip toward your chest and cross your leg so that the ankle of the affected hip rests on your opposite thigh as shown. Pull the knee of the affected hip toward the opposite shoulder until a comfortable stretch is felt in the affected hip Repeat 2 Times Hold 1 Minute Complete 1 Set Perform 3 Times a Day  ASSESSMENT:  CLINICAL IMPRESSION:Patient is doing very well.  She has been using a TENS unit at home.  No pain reported after treatment.  LTG #3 met today.   OBJECTIVE IMPAIRMENTS: Abnormal gait, decreased activity tolerance, decreased strength, increased muscle spasms, postural dysfunction, and pain.   ACTIVITY LIMITATIONS: carrying, lifting, bending, standing, sleeping, and locomotion level  PARTICIPATION LIMITATIONS: meal prep, cleaning, laundry, and occupation  PERSONAL FACTORS: 1 comorbidity: fibromyalgia  are also  affecting patient's functional outcome.   REHAB POTENTIAL: Excellent  CLINICAL DECISION MAKING: Stable/uncomplicated  EVALUATION COMPLEXITY: Low   GOALS:  SHORT TERM GOALS: Target date: 08/31/23.  Ind with an initial HEP. Goal status: On going   LONG TERM GOALS: Target date: 09/28/23.  Ind with an advanced HEP.  Goal status: MET  2.  Walk without antalgia.  Goal status: Partially met 40% better  3.  Right hip ER strength improved to 4+ to 5/5.  Goal status: MET. 4.  Perform ADL's with pain not > 3/10.  Goal status: On going  PLAN:  PT FREQUENCY: 2x/week  PT DURATION: 6 weeks  PLANNED INTERVENTIONS: Therapeutic exercises, Therapeutic activity, Patient/Family education, Self Care, Dry Needling, Electrical stimulation, Cryotherapy, Moist heat, Ultrasound, and Manual therapy.  PLAN FOR NEXT SESSION: Right Piriformis stretch, "Clam-Shell", SKTC, DKTC, hip bridges.  Modalities and STW/M as needed.   Waylon Koffler, [place], PT 09/23/2023, 12:40 PM

## 2023-09-29 ENCOUNTER — Encounter: Payer: Medicare Other | Admitting: Physical Therapy

## 2023-09-30 ENCOUNTER — Ambulatory Visit: Payer: Medicare Other | Admitting: Physical Therapy

## 2023-09-30 DIAGNOSIS — M5459 Other low back pain: Secondary | ICD-10-CM

## 2023-09-30 DIAGNOSIS — M62838 Other muscle spasm: Secondary | ICD-10-CM

## 2023-09-30 NOTE — Therapy (Signed)
OUTPATIENT PHYSICAL THERAPY THORACOLUMBAR TREATMENT   Patient Name: Stacy Hansen MRN: [id number] DOB:01-Oct-1951, 72 y.o., female Today's Date: 09/30/2023  END OF SESSION:  PT End of Session - 09/30/23 1038     Visit Number 8    Number of Visits 12    Date for PT Re-Evaluation 09/28/23    Authorization Type FOTO.    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   Hyperplastic colon polyp    Panic attacks    Stroke [facility])    TIA (transient ischemic attack)    Tobacco abuse    Past Surgical History:  Procedure Laterality Date   CATARACT EXTRACTION Bilateral    EYE SURGERY     IR GENERIC HISTORICAL  07/28/2016   IR RADIOLOGIST EVAL & MGMT 07/28/2016 [facility]INTER[facility] RAD   KNEE ARTHROSCOPY WITH MEDIAL MENISECTOMY Left 12/01/2017   Procedure: LEFT KNEE ARTHROSCOPY WITH PARTIAL MEDIAL AND LATERAL MENISECTOMY;  Surgeon: Vickki Hearing, MD;  Location: [facility];  Service: Orthopedics;  Laterality: Left;   ORIF ULNAR FRACTURE Left    ROTATOR CUFF REPAIR Left    TONSILLECTOMY     TUBAL LIGATION     VAGINAL HYSTERECTOMY     has her ovaries   VARICOSE VEIN SURGERY Right    Patient Active Problem List   Diagnosis Date Noted   History of hearing loss 01/25/2023   Vocal cord nodule 01/25/2023   Tinnitus of left ear 12/14/2022   Closed fracture of middle phalanx of left ring finger 09/13/2022   Encounter for general adult medical examination with abnormal findings 09/13/2022   Productive cough 09/13/2022   Osteopenia of lumbar spine 02/27/2020   Arthritis, multiple joint involvement 12/12/2019    Nocturnal leg cramps 04/05/2019   Dyslipidemia 12/13/2017   Gastroesophageal reflux disease without esophagitis 12/13/2017   Derangement of posterior horn of medial meniscus of left knee    Derangement of anterior horn of lateral meniscus of left knee    Multiple pulmonary nodules 07/07/2016   Seasonal allergic rhinitis due to pollen 02/11/2016   Tobacco use 11/02/2014   Brain aneurysm 11/02/2014   Crohn disease (HCC) 11/02/2014   Fibromyalgia 11/02/2014   GAD (generalized anxiety disorder) 11/02/2014   Restless leg syndrome 07/10/2014   H/O: CVA (cerebrovascular accident) 07/18/2013   Insomnia 08/16/2012   Vitamin D deficiency 01/22/2011   Osteoporosis without current pathological fracture 01/21/2011   Superficial varicosities 01/21/2011   Tear film insufficiency 01/21/2011   Atrophic vaginitis 01/21/2011   Symptomatic menopausal or female climacteric states 01/21/2011   Unspecified glaucoma 01/21/2011    REFERRING PROVIDER: Fuller Canada MD  REFERRING DIAG: Lumbar pain, radicular pain of right lower extremity.  Rationale for Evaluation and Treatment: Rehabilitation  THERAPY DIAG:  No diagnosis found.  ONSET DATE: ~one month.  SUBJECTIVE:  SUBJECTIVE STATEMENT: Pain a 4 today.   PERTINENT HISTORY:  Fibromyalgia, H/o CVA.  See above.  PAIN:  Are you having pain? Yes: NPRS scale: 4/10 Pain location: Low back, right buttock and posterior thigh. Pain description: Ache, shooting. Aggravating factors: As above. Relieving factors: As above.  PRECAUTIONS: None  RED FLAGS: None   WEIGHT BEARING RESTRICTIONS: No  FALLS:  Has patient fallen in last 6 months? No.  Fall in January 2024 due to COVID.  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  PLOF: Independent  PATIENT GOALS: Get out of pain.  NEXT MD VISIT: 09/22/23.  OBJECTIVE:  Note: Objective measures were completed at Evaluation unless otherwise noted.  DIAGNOSTIC FINDINGS:  08/11/23:  Patient shows increased lordosis at the lumbosacral junction with truncal asymmetry in the coronal plane and various levels of sclerosis around the facet joints at the lower part of the spine there is also osteophyte formation noted in the L2 area.  Impression spondylosis with abnormal alignment that does not reach the level of scoliosis                 08/11/23:  We see normal femoral heads we see perhaps some mild narrowing of the weightbearing area of the hip no osteophytes no sclerosis no cyst.  Impression mild OA of the right and left hip   PATIENT SURVEYS:  FOTO 47.08.  POSTURE: rounded shoulders, forward head, and posterior pelvic tilt  PALPATION: Tender to palpation with overpressure at L5-S1.  Very tender to palpation over right Piriformis.  LUMBAR ROM:   Active lumbar flexion limited by 50% and painful and active lumbar extension is full.  LOWER EXTREMITY ROM:     WNL.  LOWER EXTREMITY MMT:    Right hip ER decreased to 2 to 2+/5 likely decreased due to pain.  LUMBAR SPECIAL TESTS:  Positive right SLR.  Equal leg lengths.  (-) FABER testing.   Normal bilateral LE DTR's.  GAIT:  Antalgic gait.  TODAY'S TREATMENT:                                                                                                                              DATE:  09/30/23:  Nustep level 3 x 6 minutes f/b combo e'stim/US at 1.50 W/CM2 x 10 minutes to region of right Piriformis region f/b STW/M x 8 minutes.      09/22/23  Nustep level 3 x 7 minutes f/b combo e'stim/US at 1.50 W/CM2 x 10 minutes to region of right Piriformis region f/b STW/M x 7 minutes.    09/09/23:    Nustep  x 7 mins L3   Patient in left SDLY position with folded pillow between  knees for comfort:  Combo e'stim/US at 1.50 W/CM2 x 10 minutes to patient's RT Piriformis region  STW/M x 10 minutes with ischemic release  technique utilized to RT glute and piriformis                                                                 08/26/23:   TENS machine instructions Side lying hip IR/ER 2x10, side lying hip ABD x 10 and discussed HEP [place] combo x 10 mins  HMP /IFC at 80-150 Hz on 40% scan x 20 minutes to patient's right LB and Piriformis region.  Normal modality response following removal of modality.     PATIENT EDUCATION:  Education details: See below. Person educated: Patient Education method: Explanation, Demonstration, Tactile cues, Verbal cues, and Handouts Education comprehension: verbalized understanding, returned demonstration, verbal cues required, and tactile cues required  HOME EXERCISE PROGRAM: PROGRAM HOME EXERCISE PROGRAM Created by [place] Cai Flott Oct 2nd, 2024 View at [email or web address] using code: [email or web address]  Page 1 of 1 1 Exercise Supine Piriformis Stretch Lie on your back with both knees bent. Bring the knee of the affected hip toward your chest and cross your leg so that the ankle of the affected hip rests on your opposite thigh as shown. Pull the knee of the affected hip toward the opposite shoulder until a comfortable stretch is felt in the affected hip Repeat 2 Times Hold 1 Minute Complete 1 Set Perform 3 Times a Day  ASSESSMENT:  CLINICAL IMPRESSION: Patient with improved FOTO score.  No pain reported after treatment.   OBJECTIVE IMPAIRMENTS: Abnormal gait, decreased activity tolerance, decreased strength, increased muscle spasms, postural dysfunction, and pain.   ACTIVITY LIMITATIONS: carrying, lifting, bending, standing, sleeping, and locomotion level  PARTICIPATION LIMITATIONS: meal prep, cleaning, laundry, and occupation  PERSONAL FACTORS: 1  comorbidity: fibromyalgia  are also affecting patient's functional outcome.   REHAB POTENTIAL: Excellent  CLINICAL DECISION MAKING: Stable/uncomplicated  EVALUATION COMPLEXITY: Low   GOALS:  SHORT TERM GOALS: Target date: 08/31/23.  Ind with an initial HEP. Goal status: On going   LONG TERM GOALS: Target date: 09/28/23.  Ind with an advanced HEP.  Goal status: MET  2.  Walk without antalgia.  Goal status: Partially met 40% better  3.  Right hip ER strength improved to 4+ to 5/5.  Goal status: MET. 4.  Perform ADL's with pain not > 3/10.  Goal status: On going  PLAN:  PT FREQUENCY: 2x/week  PT DURATION: 6 weeks  PLANNED INTERVENTIONS: Therapeutic exercises, Therapeutic activity, Patient/Family education, Self Care, Dry Needling, Electrical stimulation, Cryotherapy, Moist heat, Ultrasound, and Manual therapy.  PLAN FOR NEXT SESSION: Right Piriformis stretch, "Clam-Shell", SKTC, DKTC, hip bridges.  Modalities and STW/M as needed.   Kash Davie, [place], PT 09/30/2023, 11:51 AM  OUTPATIENT PHYSICAL THERAPY THORACOLUMBAR TREATMENT   Patient Name: Stacy Hansen MRN: [id number] DOB:[birth year], 72 y.o., female Today's Date: 09/30/2023  END OF SESSION:  PT End of Session - 09/30/23 1038     Visit Number 8    Number of Visits 12    Date for PT Re-Evaluation 09/28/23    Authorization Type FOTO.    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   Hyperplastic colon polyp    Panic attacks    Stroke [facility])    TIA (transient ischemic attack)    Tobacco abuse    Past Surgical History:   Procedure Laterality Date   CATARACT EXTRACTION Bilateral    EYE SURGERY     IR GENERIC HISTORICAL  07/28/2016   IR RADIOLOGIST EVAL & MGMT 07/28/2016 [facility]   KNEE ARTHROSCOPY WITH MEDIAL MENISECTOMY Left 12/01/2017   Procedure: LEFT KNEE ARTHROSCOPY WITH PARTIAL MEDIAL AND LATERAL MENISECTOMY;  Surgeon: Vickki Hearing, MD;  Location: [facility];  Service: Orthopedics;  Laterality: Left;   ORIF ULNAR FRACTURE Left    ROTATOR CUFF REPAIR Left    TONSILLECTOMY     TUBAL LIGATION     VAGINAL HYSTERECTOMY     has her ovaries   VARICOSE VEIN SURGERY Right    Patient Active Problem List   Diagnosis Date Noted   History of hearing loss 01/25/2023   Vocal cord nodule 01/25/2023   Tinnitus of left ear 12/14/2022   Closed fracture of middle phalanx of left ring finger 09/13/2022   Encounter for general adult medical examination with abnormal findings 09/13/2022   Productive cough 09/13/2022   Osteopenia of lumbar spine 02/27/2020   Arthritis, multiple joint involvement 12/12/2019   Nocturnal leg cramps 04/05/2019   Dyslipidemia 12/13/2017   Gastroesophageal reflux disease without esophagitis 12/13/2017   Derangement of posterior horn of medial meniscus of left knee    Derangement of anterior horn of lateral meniscus of left knee    Multiple pulmonary nodules 07/07/2016   Seasonal allergic rhinitis due to pollen 02/11/2016   Tobacco use 11/02/2014   Brain aneurysm 11/02/2014   Crohn disease (HCC) 11/02/2014   Fibromyalgia 11/02/2014   GAD (generalized anxiety disorder) 11/02/2014   Restless leg syndrome 07/10/2014   H/O: CVA (cerebrovascular accident) 07/18/2013   Insomnia 08/16/2012   Vitamin D deficiency 01/22/2011   Osteoporosis without current pathological fracture 01/21/2011   Superficial varicosities 01/21/2011   Tear film insufficiency 01/21/2011   Atrophic vaginitis 01/21/2011   Symptomatic menopausal or female climacteric states 01/21/2011   Unspecified glaucoma  01/21/2011    REFERRING PROVIDER: Fuller Canada MD  REFERRING DIAG: Lumbar pain, radicular pain of right lower extremity.  Rationale for Evaluation and Treatment: Rehabilitation  THERAPY DIAG:  No diagnosis found.  ONSET DATE: ~one month.  SUBJECTIVE:  SUBJECTIVE STATEMENT: Much better.  Just one small spot hurts.  Been using TENS unit.    PERTINENT HISTORY:  Fibromyalgia, H/o CVA.  See above.  PAIN:  Are you having pain? Yes: NPRS scale: 1-2/10 Pain location: Low back, right buttock and posterior thigh. Pain description: Ache, shooting. Aggravating factors: As above. Relieving factors: As above.  PRECAUTIONS: None  RED FLAGS: None   WEIGHT BEARING RESTRICTIONS: No  FALLS:  Has patient fallen in last 6 months? No.  Fall in January 2024 due to COVID.  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  PLOF: Independent  PATIENT GOALS: Get out of pain.  NEXT MD VISIT: 09/22/23.  OBJECTIVE:  Note: Objective measures were completed at Evaluation unless otherwise noted.  DIAGNOSTIC FINDINGS:  08/11/23:  Patient shows increased lordosis at the lumbosacral junction with truncal asymmetry in the coronal plane and various levels of sclerosis around the facet joints at the lower part of the spine there is also osteophyte formation noted in the L2 area.  Impression spondylosis with abnormal alignment that does not reach the level of scoliosis                 08/11/23:  We see normal femoral heads we see perhaps some mild narrowing of the weightbearing area of the hip no osteophytes no sclerosis no cyst.  Impression mild OA of the right and left hip   PATIENT  SURVEYS:  FOTO 47.08.  POSTURE: rounded shoulders, forward head, and posterior pelvic tilt  PALPATION: Tender to palpation with overpressure at L5-S1.  Very tender to palpation over right Piriformis.  LUMBAR ROM:   Active lumbar flexion limited by 50% and painful and active lumbar extension is full.  LOWER EXTREMITY ROM:     WNL.  LOWER EXTREMITY MMT:    Right hip ER decreased to 2 to 2+/5 likely decreased due to pain.  LUMBAR SPECIAL TESTS:  Positive right SLR.  Equal leg lengths.  (-) FABER testing.   Normal bilateral LE DTR's.  GAIT:  Antalgic gait.  TODAY'S TREATMENT:                                                                                                                              DATE:   09/22/23  Nustep level 3 x 7 minutes f/b combo e'stim/US at 1.50 W/CM2 x 10 minutes to region of right Piriformis region f/b STW/M x 7 minutes.    09/09/23:    Nustep x 7 mins L3   Patient in left SDLY position with folded pillow between  knees for comfort:  Combo e'stim/US at 1.50 W/CM2 x 10 minutes to patient's RT Piriformis region  STW/M x 10 minutes with ischemic release technique utilized to RT glute and piriformis  08/26/23:   TENS machine instructions Side lying hip IR/ER 2x10, side lying hip ABD x 10 and discussed HEP [place] combo x 10 mins  HMP /IFC at 80-150 Hz on 40% scan x 20 minutes to patient's right LB and Piriformis region.  Normal modality response following removal of modality.     PATIENT EDUCATION:  Education details: See below. Person educated: Patient Education method: Explanation, Demonstration, Tactile cues, Verbal cues, and Handouts Education comprehension: verbalized understanding, returned demonstration, verbal cues required, and tactile cues required  HOME EXERCISE PROGRAM: PROGRAM HOME EXERCISE PROGRAM Created by [place] Dedra Matsuo Oct 2nd, 2024 View at  [email or web address] using code: [email or web address]  Page 1 of 1 1 Exercise Supine Piriformis Stretch Lie on your back with both knees bent. Bring the knee of the affected hip toward your chest and cross your leg so that the ankle of the affected hip rests on your opposite thigh as shown. Pull the knee of the affected hip toward the opposite shoulder until a comfortable stretch is felt in the affected hip Repeat 2 Times Hold 1 Minute Complete 1 Set Perform 3 Times a Day  ASSESSMENT:  CLINICAL IMPRESSION:Patient is doing very well.  She has been using a TENS unit at home.  No pain reported after treatment.  LTG #3 met today.   OBJECTIVE IMPAIRMENTS: Abnormal gait, decreased activity tolerance, decreased strength, increased muscle spasms, postural dysfunction, and pain.   ACTIVITY LIMITATIONS: carrying, lifting, bending, standing, sleeping, and locomotion level  PARTICIPATION LIMITATIONS: meal prep, cleaning, laundry, and occupation  PERSONAL FACTORS: 1 comorbidity: fibromyalgia  are also affecting patient's functional outcome.   REHAB POTENTIAL: Excellent  CLINICAL DECISION MAKING: Stable/uncomplicated  EVALUATION COMPLEXITY: Low   GOALS:  SHORT TERM GOALS: Target date: 08/31/23.  Ind with an initial HEP. Goal status: On going   LONG TERM GOALS: Target date: 09/28/23.  Ind with an advanced HEP.  Goal status: MET  2.  Walk without antalgia.  Goal status: Partially met 40% better  3.  Right hip ER strength improved to 4+ to 5/5.  Goal status: MET. 4.  Perform ADL's with pain not > 3/10.  Goal status: On going  PLAN:  PT FREQUENCY: 2x/week  PT DURATION: 6 weeks  PLANNED INTERVENTIONS: Therapeutic exercises, Therapeutic activity, Patient/Family education, Self Care, Dry Needling, Electrical stimulation, Cryotherapy, Moist heat, Ultrasound, and Manual therapy.  PLAN FOR NEXT SESSION: Right Piriformis stretch, "Clam-Shell", SKTC, DKTC, hip bridges.  Modalities and STW/M  as needed.   Kinley Dozier, [place], PT 09/30/2023, 11:51 AM

## 2023-10-21 ENCOUNTER — Ambulatory Visit: Payer: Medicare Other | Attending: Orthopedic Surgery | Admitting: Physical Therapy

## 2023-10-21 ENCOUNTER — Encounter: Payer: Self-pay | Admitting: Physical Therapy

## 2023-10-21 DIAGNOSIS — M62838 Other muscle spasm: Secondary | ICD-10-CM | POA: Insufficient documentation

## 2023-10-21 DIAGNOSIS — M5459 Other low back pain: Secondary | ICD-10-CM | POA: Insufficient documentation

## 2023-10-21 NOTE — Therapy (Signed)
OUTPATIENT PHYSICAL THERAPY THORACOLUMBAR TREATMENT   Patient Name: Stacy Hansen MRN: [id number] DOB:1951/11/08, 72 y.o., female Today's Date: 10/21/2023  END OF SESSION:  PT End of Session - 10/21/23 1233     Visit Number 9    Number of Visits 12    Date for PT Re-Evaluation 10/21/23    PT Start Time 0917    PT Stop Time 0947    PT Time Calculation (min) 30 min    Activity Tolerance Patient tolerated treatment well    Behavior During Therapy [facility] for tasks assessed/performed               Past Medical History:  Diagnosis Date   Anxiety    Brain aneurysm    Crohn disease (HCC)    Depression    Fibromyalgia    GERD (gastroesophageal reflux disease)    Headache    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   Hyperplastic colon polyp    Panic attacks    Stroke [facility])    TIA (transient ischemic attack)    Tobacco abuse    Past Surgical History:  Procedure Laterality Date   CATARACT EXTRACTION Bilateral    EYE SURGERY     IR GENERIC HISTORICAL  07/28/2016   IR RADIOLOGIST EVAL & MGMT 07/28/2016 [facility]-INTERV RAD   KNEE ARTHROSCOPY WITH MEDIAL MENISECTOMY Left 12/01/2017   Procedure: LEFT KNEE ARTHROSCOPY WITH PARTIAL MEDIAL AND LATERAL MENISECTOMY;  Surgeon: Vickki Hearing, MD;  Location: [facility];  Service: Orthopedics;  Laterality: Left;   ORIF ULNAR FRACTURE Left    ROTATOR CUFF REPAIR Left    TONSILLECTOMY     TUBAL LIGATION     VAGINAL HYSTERECTOMY     has her ovaries   VARICOSE VEIN SURGERY Right    Patient Active Problem List   Diagnosis Date Noted   History of hearing loss 01/25/2023   Vocal cord nodule 01/25/2023   Tinnitus of left ear 12/14/2022   Closed fracture of middle phalanx of left ring finger 09/13/2022   Encounter for general adult medical examination with abnormal findings 09/13/2022   Productive cough 09/13/2022   Osteopenia of lumbar spine 02/27/2020   Arthritis, multiple joint involvement 12/12/2019   Nocturnal leg cramps 04/05/2019    Dyslipidemia 12/13/2017   Gastroesophageal reflux disease without esophagitis 12/13/2017   Derangement of posterior horn of medial meniscus of left knee    Derangement of anterior horn of lateral meniscus of left knee    Multiple pulmonary nodules 07/07/2016   Seasonal allergic rhinitis due to pollen 02/11/2016   Tobacco use 11/02/2014   Brain aneurysm 11/02/2014   Crohn disease (HCC) 11/02/2014   Fibromyalgia 11/02/2014   GAD (generalized anxiety disorder) 11/02/2014   Restless leg syndrome 07/10/2014   H/O: CVA (cerebrovascular accident) 07/18/2013   Insomnia 08/16/2012   Vitamin D deficiency 01/22/2011   Osteoporosis without current pathological fracture 01/21/2011   Superficial varicosities 01/21/2011   Tear film insufficiency 01/21/2011   Atrophic vaginitis 01/21/2011   Symptomatic menopausal or female climacteric states 01/21/2011   Unspecified glaucoma 01/21/2011    REFERRING PROVIDER: Fuller Canada MD  REFERRING DIAG: Lumbar pain, radicular pain of right lower extremity.  Rationale for Evaluation and Treatment: Rehabilitation  THERAPY DIAG:  Other low back pain - Plan: PT plan of care cert/re-cert  Other muscle spasm - Plan: PT plan of care cert/re-cert  ONSET DATE: ~one month.  SUBJECTIVE:  SUBJECTIVE STATEMENT: Low pain.  Much better.  PERTINENT HISTORY:  Fibromyalgia, H/o CVA.  See above.  PAIN:  Are you having pain? Yes: NPRS scale: low/10 Pain location: Low back, right buttock and posterior thigh. Pain description: Ache, shooting. Aggravating factors: As above. Relieving factors: As above.  PRECAUTIONS: None  RED FLAGS: None   WEIGHT BEARING RESTRICTIONS: No  FALLS:  Has patient fallen in last 6 months? No.  Fall in January 2024 due to COVID.  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  PLOF: Independent  PATIENT GOALS: Get out of pain.  NEXT MD VISIT: 09/22/23.  OBJECTIVE:  Note: Objective measures were completed at Evaluation unless otherwise noted.  DIAGNOSTIC FINDINGS:  08/11/23:  Patient shows increased lordosis at the lumbosacral junction with truncal asymmetry in the coronal plane and various levels of sclerosis around the facet joints at the lower part of the spine there is also osteophyte formation noted in the L2 area.  Impression spondylosis with abnormal alignment that does not reach the level of scoliosis                 08/11/23:  We see normal femoral heads we see perhaps some mild narrowing of the weightbearing area of the hip no osteophytes no sclerosis no cyst.  Impression mild OA of the right and left hip   PATIENT SURVEYS:  FOTO 47.08.  POSTURE: rounded shoulders, forward head, and posterior pelvic tilt  PALPATION: Tender to palpation with overpressure at L5-S1.  Very tender to palpation over right Piriformis.  LUMBAR ROM:   Active lumbar flexion limited by 50% and painful and active lumbar extension is full.  LOWER EXTREMITY ROM:     WNL.  LOWER EXTREMITY MMT:    Right hip ER decreased to 2 to 2+/5 likely decreased due to pain.  LUMBAR SPECIAL TESTS:  Positive right SLR.  Equal leg lengths.  (-) FABER testing.   Normal bilateral LE DTR's.  GAIT:  Antalgic gait.  TODAY'S TREATMENT:                                                                                                                              DATE:  10/21/23:  Nustep level 3 x 10 minutes f/b STW/M x 13 minutes.    09/30/23:  Nustep level 3 x 6 minutes f/b combo e'stim/US at 1.50 W/CM2 x 10 minutes to region of right Piriformis region f/b STW/M x 8 minutes.    PATIENT EDUCATION:  Education details: See  below. Person educated: Patient Education method: Explanation, Demonstration, Tactile cues, Verbal cues, and Handouts Education comprehension: verbalized understanding, returned demonstration, verbal cues required, and tactile cues required  HOME EXERCISE PROGRAM: PROGRAM HOME EXERCISE PROGRAM Created by [place] Jomes Giraldo Oct 2nd, 2024 View at [email or web address] using code: [email or web address]  Page 1 of 1 1 Exercise Supine Piriformis  Stretch Lie on your back with both knees bent. Bring the knee of the affected hip toward your chest and cross your leg so that the ankle of the affected hip rests on your opposite thigh as shown. Pull the knee of the affected hip toward the opposite shoulder until a comfortable stretch is felt in the affected hip Repeat 2 Times Hold 1 Minute Complete 1 Set Perform 3 Times a Day  ASSESSMENT:  CLINICAL IMPRESSION: See below.   OBJECTIVE IMPAIRMENTS: Abnormal gait, decreased activity tolerance, decreased strength, increased muscle spasms, postural dysfunction, and pain.   ACTIVITY LIMITATIONS: carrying, lifting, bending, standing, sleeping, and locomotion level  PARTICIPATION LIMITATIONS: meal prep, cleaning, laundry, and occupation  PERSONAL FACTORS: 1 comorbidity: fibromyalgia  are also affecting patient's functional outcome.   REHAB POTENTIAL: Excellent  CLINICAL DECISION MAKING: Stable/uncomplicated  EVALUATION COMPLEXITY: Low   GOALS:  SHORT TERM GOALS: Target date: 08/31/23.  Ind with an initial HEP. Goal status: On going   LONG TERM GOALS: Target date: 10/21/23.  Ind with an advanced HEP.  Goal status: MET 2.  Walk without antalgia.  Goal status: MET 3.  Right hip ER strength improved to 4+ to 5/5.  Goal status: MET. 4.  Perform ADL's with pain not > 3/10.  Goal status: MET.  PLAN:  PT FREQUENCY: 2x/week  PT DURATION: 6 weeks  PLANNED INTERVENTIONS: Therapeutic exercises, Therapeutic activity, Patient/Family education, Self  Care, Dry Needling, Electrical stimulation, Cryotherapy, Moist heat, Ultrasound, and Manual therapy.  PLAN FOR NEXT SESSION: Right Piriformis stretch, "Clam-Shell", SKTC, DKTC, hip bridges.  Modalities and STW/M as needed.    PHYSICAL THERAPY DISCHARGE SUMMARY  Visits from Start of Care: 9.  Current functional level related to goals / functional outcomes: All goals met.  "90% better."   Remaining deficits: All goals met.   Education / Equipment: HEP.   Patient agrees to discharge. Patient goals were met. Patient is being discharged due to meeting the stated rehab goals.  Penne Rosenstock, [place], PT 10/21/2023, 12:44 PM  OUTPATIENT PHYSICAL THERAPY THORACOLUMBAR TREATMENT   Patient Name: Stacy Hansen MRN: [id number] DOB:[birth year], 72 y.o., female Today's Date: 10/21/2023  END OF SESSION:  PT End of Session - 10/21/23 1233     Visit Number 9    Number of Visits 12    Date for PT Re-Evaluation 10/21/23    PT Start Time 0917    PT Stop Time 0947    PT Time Calculation (min) 30 min    Activity Tolerance Patient tolerated treatment well    Behavior During Therapy [facility] for tasks assessed/performed              Past Medical History:  Diagnosis Date   Anxiety    Brain aneurysm    Crohn disease (HCC)    Depression    Fibromyalgia    GERD (gastroesophageal reflux disease)    Headache    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   Hyperplastic colon polyp    Panic attacks    Stroke [facility])    TIA (transient ischemic attack)    Tobacco abuse    Past Surgical History:  Procedure Laterality Date   CATARACT EXTRACTION Bilateral    EYE SURGERY     IR GENERIC HISTORICAL  07/28/2016   IR RADIOLOGIST EVAL & MGMT 07/28/2016 [facility]INTER[facility] RAD   KNEE ARTHROSCOPY WITH MEDIAL MENISECTOMY Left 12/01/2017   Procedure: LEFT KNEE ARTHROSCOPY WITH PARTIAL MEDIAL AND LATERAL MENISECTOMY;  Surgeon: Vickki Hearing,  MD;  Location: [facility];  Service: Orthopedics;  Laterality: Left;   ORIF  ULNAR FRACTURE Left    ROTATOR CUFF REPAIR Left    TONSILLECTOMY     TUBAL LIGATION     VAGINAL HYSTERECTOMY     has her ovaries   VARICOSE VEIN SURGERY Right    Patient Active Problem List   Diagnosis Date Noted   History of hearing loss 01/25/2023   Vocal cord nodule 01/25/2023   Tinnitus of left ear 12/14/2022   Closed fracture of middle phalanx of left ring finger 09/13/2022   Encounter for general adult medical examination with abnormal findings 09/13/2022   Productive cough 09/13/2022   Osteopenia of lumbar spine 02/27/2020   Arthritis, multiple joint involvement 12/12/2019   Nocturnal leg cramps 04/05/2019   Dyslipidemia 12/13/2017   Gastroesophageal reflux disease without esophagitis 12/13/2017   Derangement of posterior horn of medial meniscus of left knee    Derangement of anterior horn of lateral meniscus of left knee    Multiple pulmonary nodules 07/07/2016   Seasonal allergic rhinitis due to pollen 02/11/2016   Tobacco use 11/02/2014   Brain aneurysm 11/02/2014   Crohn disease (HCC) 11/02/2014   Fibromyalgia 11/02/2014   GAD (generalized anxiety disorder) 11/02/2014   Restless leg syndrome 07/10/2014   H/O: CVA (cerebrovascular accident) 07/18/2013   Insomnia 08/16/2012   Vitamin D deficiency 01/22/2011   Osteoporosis without current pathological fracture 01/21/2011   Superficial varicosities 01/21/2011   Tear film insufficiency 01/21/2011   Atrophic vaginitis 01/21/2011   Symptomatic menopausal or female climacteric states 01/21/2011   Unspecified glaucoma 01/21/2011    REFERRING PROVIDER: Fuller Canada MD  REFERRING DIAG: Lumbar pain, radicular pain of right lower extremity.  Rationale for Evaluation and Treatment: Rehabilitation  THERAPY DIAG:  Other low back pain - Plan: PT plan of care cert/re-cert  Other muscle spasm - Plan: PT plan of care cert/re-cert  ONSET DATE: ~one month.  SUBJECTIVE:                                                                                                                                                                                            SUBJECTIVE STATEMENT: Much better.  Just one small spot hurts.  Been using TENS unit.    PERTINENT HISTORY:  Fibromyalgia, H/o CVA.  See above.  PAIN:  Are you having pain? Yes: NPRS scale: 1-2/10 Pain location: Low back, right buttock and posterior thigh. Pain description: Ache, shooting. Aggravating factors: As above. Relieving factors: As above.  PRECAUTIONS: None  RED FLAGS: None   WEIGHT BEARING RESTRICTIONS: No  FALLS:  Has patient fallen  in last 6 months? No.  Fall in January 2024 due to COVID.  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  PLOF: Independent  PATIENT GOALS: Get out of pain.  NEXT MD VISIT: 09/22/23.  OBJECTIVE:  Note: Objective measures were completed at Evaluation unless otherwise noted.  DIAGNOSTIC FINDINGS:  08/11/23:  Patient shows increased lordosis at the lumbosacral junction with truncal asymmetry in the coronal plane and various levels of sclerosis around the facet joints at the lower part of the spine there is also osteophyte formation noted in the L2 area.  Impression spondylosis with abnormal alignment that does not reach the level of scoliosis                 08/11/23:  We see normal femoral heads we see perhaps some mild narrowing of the weightbearing area of the hip no osteophytes no sclerosis no cyst.  Impression mild OA of the right and left hip   PATIENT SURVEYS:  FOTO 47.08.  POSTURE: rounded shoulders, forward head, and posterior pelvic tilt  PALPATION: Tender to palpation with overpressure at L5-S1.  Very tender to palpation over right Piriformis.  LUMBAR ROM:   Active lumbar flexion limited by 50% and painful and active lumbar extension is full.  LOWER  EXTREMITY ROM:     WNL.  LOWER EXTREMITY MMT:    Right hip ER decreased to 2 to 2+/5 likely decreased due to pain.  LUMBAR SPECIAL TESTS:  Positive right SLR.  Equal leg lengths.  (-) FABER testing.   Normal bilateral LE DTR's.  GAIT:  Antalgic gait.  TODAY'S TREATMENT:                                                                                                                              DATE:   09/22/23  Nustep level 3 x 7 minutes f/b combo e'stim/US at 1.50 W/CM2 x 10 minutes to region of right Piriformis region f/b STW/M x 7 minutes.    09/09/23:    Nustep x 7 mins L3   Patient in left SDLY position with folded pillow between  knees for comfort:  Combo e'stim/US at 1.50 W/CM2 x 10 minutes to patient's RT Piriformis region  STW/M x 10 minutes with ischemic release technique utilized to RT glute and piriformis                                                                 08/26/23:   TENS machine instructions Side lying hip IR/ER 2x10, side lying hip ABD x 10 and discussed HEP [place] combo x 10 mins  HMP /IFC at 80-150  Hz on 40% scan x 20 minutes to patient's right LB and Piriformis region.  Normal modality response following removal of modality.     PATIENT EDUCATION:  Education details: See below. Person educated: Patient Education method: Explanation, Demonstration, Tactile cues, Verbal cues, and Handouts Education comprehension: verbalized understanding, returned demonstration, verbal cues required, and tactile cues required  HOME EXERCISE PROGRAM: PROGRAM HOME EXERCISE PROGRAM Created by [place] Amaree Loisel Oct 2nd, 2024 View at [email or web address] using code: [email or web address]  Page 1 of 1 1 Exercise Supine Piriformis Stretch Lie on your back with both knees bent. Bring the knee of the affected hip toward your chest and cross your leg so that the ankle of the affected hip rests on your opposite thigh as shown. Pull the knee of the affected hip toward the  opposite shoulder until a comfortable stretch is felt in the affected hip Repeat 2 Times Hold 1 Minute Complete 1 Set Perform 3 Times a Day  ASSESSMENT:  CLINICAL IMPRESSION:Patient is doing very well.  She has been using a TENS unit at home.  No pain reported after treatment.  LTG #3 met today.   OBJECTIVE IMPAIRMENTS: Abnormal gait, decreased activity tolerance, decreased strength, increased muscle spasms, postural dysfunction, and pain.   ACTIVITY LIMITATIONS: carrying, lifting, bending, standing, sleeping, and locomotion level  PARTICIPATION LIMITATIONS: meal prep, cleaning, laundry, and occupation  PERSONAL FACTORS: 1 comorbidity: fibromyalgia  are also affecting patient's functional outcome.   REHAB POTENTIAL: Excellent  CLINICAL DECISION MAKING: Stable/uncomplicated  EVALUATION COMPLEXITY: Low   GOALS:  SHORT TERM GOALS: Target date: 08/31/23.  Ind with an initial HEP. Goal status: On going   LONG TERM GOALS: Target date: 10/21/23.  Ind with an advanced HEP.  Goal status: MET  2.  Walk without antalgia.  Goal status: 90% better.  3.  Right hip ER strength improved to 4+ to 5/5.  Goal status: MET.  4.  Perform ADL's with pain not > 3/10.  Goal status: MET.  PLAN:  PT FREQUENCY: 2x/week  PT DURATION: 6 weeks  PLANNED INTERVENTIONS: Therapeutic exercises, Therapeutic activity, Patient/Family education, Self Care, Dry Needling, Electrical stimulation, Cryotherapy, Moist heat, Ultrasound, and Manual therapy.  PLAN FOR NEXT SESSION: Right Piriformis stretch, "Clam-Shell", SKTC, DKTC, hip bridges.  Modalities and STW/M as needed.   Markayla Reichart, [place], PT 10/21/2023, 12:44 PM

## 2023-10-27 ENCOUNTER — Encounter: Payer: Self-pay | Admitting: Orthopedic Surgery

## 2023-10-27 ENCOUNTER — Ambulatory Visit: Payer: Medicare Other | Admitting: Orthopedic Surgery

## 2023-10-27 DIAGNOSIS — M65331 Trigger finger, right middle finger: Secondary | ICD-10-CM

## 2023-10-27 DIAGNOSIS — M5410 Radiculopathy, site unspecified: Secondary | ICD-10-CM | POA: Diagnosis not present

## 2023-10-27 NOTE — Patient Instructions (Addendum)
While we are working on your approval for MRI please go ahead and call to schedule your appointment with [facility] within at least one (1) week.   [phone]

## 2023-10-27 NOTE — Progress Notes (Signed)
Follow-up visit  Encounter Diagnoses  Name Primary?   Radicular pain of right lower extremity Yes   Trigger finger, right middle finger    The patient has completed her physical therapy for her radicular pain in her right lower extremity  She continues to have pain in the right buttock and lower back with some radiation  She would like further intervention  This will require getting an MRI of the back which I will review with her and then send her to Dr. Christell Constant for further management   Problem right middle/long finger -72 year old female with new onset catching locking right long is consistent with trigger finger  She has tenderness over the A1 pulley she has catching requiring manipulation to release the finger back into extension  She is amenable to injection  Trigger finger injection  Diagnosis synovitis right long finger Procedure injection A1 pulley Medications lidocaine 1% 1 mL and Depo-Medrol 40 mg 1 mL Skin prep alcohol and ethyl chloride Verbal consent was obtained Timeout confirmed the injection site  After cleaning the skin with alcohol and anesthetizing the skin with ethyl chloride the A1 pulley was palpated and the injection was performed without complication

## 2023-12-01 ENCOUNTER — Encounter: Payer: Medicare Other | Admitting: Orthopedic Surgery

## 2023-12-29 ENCOUNTER — Ambulatory Visit: Payer: Medicare Other | Admitting: Orthopedic Surgery

## 2023-12-29 ENCOUNTER — Other Ambulatory Visit (INDEPENDENT_AMBULATORY_CARE_PROVIDER_SITE_OTHER): Payer: Medicare Other

## 2023-12-29 VITALS — BP 113/74 | HR 61 | Ht 63.0 in | Wt 112.0 lb

## 2023-12-29 DIAGNOSIS — M545 Low back pain, unspecified: Secondary | ICD-10-CM | POA: Diagnosis not present

## 2023-12-29 MED ORDER — LORAZEPAM 2 MG PO TABS: 2.0000 mg | ORAL_TABLET | Freq: Once | ORAL | 0 refills | Status: AC | PRN

## 2023-12-29 NOTE — Progress Notes (Signed)
Orthopedic Spine Surgery Office Note  Assessment: Patient is a 73 y.o. female with right sided low back pain and provocative exam and he was pointing to SI joint as possible etiology   Plan: -Explained that initially conservative treatment is tried as a significant number of patients may experience relief with these treatment modalities. Discussed that the conservative treatments include:  -activity modification  -physical therapy  -over the counter pain medications  -medrol dosepak  -steroid injections -Patient has tried PT, Tylenol, oral steroids, intramuscular steroid injections  -Recommended diagnostic/therapeutic right SI joint injection with Dr. Shon Baton -Prescribed lorazepam to help with her anxiety when getting the shot -Would need to be nicotine free prior to any elective spine surgery -Patient should return to office on an as-needed basis   Patient expressed understanding of the plan and all questions were answered to the patient's satisfaction.   ___________________________________________________________________________   History:  Patient is a 73 y.o. female who presents today for lumbar spine.  Patient has had low back pain for about 3 to 4 months.  There was no trauma or injury that preceded the onset of pain.  Pain initially started in her low back on the right side.  And then radiated along the lateral aspect of her right thigh and leg.  She was referred to PT and the leg pain has resolved.  However, she still has significant right sided lower back pain.  She feels it in the area of the SI joint.  She notes the pain is worse with activity and improves with rest.  She has been using a TENS unit to try and help as well.  She is not having any pain rating into either lower extremity at this time.   Weakness: Denies Symptoms of imbalance: Denies Paresthesias and numbness: Denies Bowel or bladder incontinence: Denies Saddle anesthesia: Denies  Treatments tried: PT,  Tylenol, oral steroids, intramuscular steroid injections  Review of systems: Denies night sweats, unexplained weight loss, history of cancer, pain that wakes them at night.  Has had fevers and chills  Past medical history: GERD History of stroke Vertigo Depression/anxiety Fibromyalgia TIA  Allergies: dexamethasone, ciprofloxacin, meperidine, opium, promethazine, propoxyphene  Past surgical history:  Cataract surgery Left rotator cuff repair Left ulna fracture ORIF Left knee arthroscopy with partial medial meniscectomy Tonsillectomy Tubal ligation Hysterectomy  Social history: Reports use of nicotine product (smoking, vaping, patches, smokeless) Alcohol use: Denies Denies recreational drug use   Physical Exam:  BMI of 19.8  General: no acute distress, appears stated age Neurologic: alert, answering questions appropriately, following commands Respiratory: unlabored breathing on room air, symmetric chest rise Psychiatric: appropriate affect, normal cadence to speech   MSK (spine):  -Strength exam      Left  Right EHL    5/5  5/5 TA    5/5  5/5 GSC    5/5  5/5 Knee extension  5/5  5/5 Hip flexion   5/5  5/5  -Sensory exam    Sensation intact to light touch in L3-S1 nerve distributions of bilateral lower extremities  -Achilles DTR: 2/4 on the left, 2/4 on the right -Patellar tendon DTR: 2/4 on the left, 2/4 on the right  -Straight leg raise: Negative bilaterally -Femoral nerve stretch test: Negative bilaterally -Clonus: no beats bilaterally  -Left hip exam: No pain through range of motion, negative SI joint compression test, negative FABER -Right hip exam: No pain to range of motion, positive FABER, positive Fortin finger test, positive Gaenslen's, positive SI joint compression test  Imaging: XRs of the lumbar spine from 12/29/2023 were independently reviewed and interpreted, showing no significant degenerative changes.  No fracture or dislocation seen.  No  evidence of instability on flexion/extension views.  MRI of the lumbar spine (on CD) from 12/15/2023 was independently reviewed and interpreted, showing no significant DDD.  No central, lateral recess, foraminal stenosis seen.   Patient name: Stacy Hansen Patient MRN: [id number] Date of visit: 12/29/23

## 2024-01-27 ENCOUNTER — Ambulatory Visit: Payer: Medicare Other | Admitting: Sports Medicine

## 2024-01-27 ENCOUNTER — Encounter: Payer: Self-pay | Admitting: Sports Medicine

## 2024-01-27 ENCOUNTER — Other Ambulatory Visit: Payer: Self-pay

## 2024-01-27 DIAGNOSIS — M7918 Myalgia, other site: Secondary | ICD-10-CM | POA: Diagnosis not present

## 2024-01-27 DIAGNOSIS — M533 Sacrococcygeal disorders, not elsewhere classified: Secondary | ICD-10-CM

## 2024-01-27 DIAGNOSIS — G8929 Other chronic pain: Secondary | ICD-10-CM

## 2024-01-27 NOTE — Progress Notes (Signed)
 Stacy Hansen - 73 y.o. female MRN [id number]  Date of birth: September 06, 1951  Office Visit Note: Visit Date: 01/27/2024 PCP: April Manson, NP Referred by: April Manson, NP  Subjective: Chief Complaint  Patient presents with   Lower Back - Pain   HPI: Stacy Hansen is a pleasant 73 y.o. female who presents today for right-sided low back pain near the SI joint region.  Patient has seen my partner, Dr. Christell Constant, did evaluate her spine and did not think this was radicular in nature.  Believed her pain to emanate from the right SI joint, sent here for further evaluation and possible injection.  Stacy Hansen tells me today that about 2-3 weeks ago she felt a pop in the right side of the low back and this actually improved her pain.  Since then she really has not had any pain about the leg.  She denies any radicular symptoms going down the low back.  Initially she thought she was going to proceed with an injection, but today she states since she is having no pain she would like to defer this today.  He has used Tylenol in the past but has not needed any given her pain.  She is asking for a note to help with accommodations at work as when she does repetitive bending up and down about the waist this will worsen her pain.  Pertinent ROS were reviewed with the patient and found to be negative unless otherwise specified above in HPI.   Assessment & Plan: Visit Diagnoses:  1. Chronic SI joint pain   2. Pain in right buttock    Plan: Impression is chronic right-sided low back pain that has improved spontaneously over the last 2-3 weeks.  Her symptoms and her provocative exam do seem to emanate from the right SI joint.  We did discuss the option of SI joint injection, although given that she is having no pain for the last 2-3 weeks she would like to defer this today, which I am in agreements with.  We have discussed activity modification.  I did provide a note for work to try to limit the repetitive bending  and flexing about the waist as this is an exacerbating symptom for her.  Did allow for modified seated rest breaks for 5-10 minutes as needed.  If pain returns and she wishes to proceed with injection, she will let me know and we can follow-up with her.  Otherwise she will continue routine spine follow-up with Dr. Christell Constant.  She may use ice/heat or Tylenol for pain as needed.  Follow-up: Return if symptoms worsen or fail to improve.   Meds & Orders: No orders of the defined types were placed in this encounter.   Orders Placed This Encounter  Procedures   US Guided Needle Placement - No Linked Charges     Procedures: No procedures performed      Clinical History: No specialty comments available.  She reports that she has been smoking cigarettes. She has a 10 pack-year smoking history. She has never used smokeless tobacco. No results for input(s): "HGBA1C", "LABURIC" in the last 8760 hours.  Objective:    Physical Exam  Gen: Well-appearing, in no acute distress; non-toxic CV: Well-perfused. Warm.  Resp: Breathing unlabored on room air; no wheezing. Psych: Fluid speech in conversation; appropriate affect; normal thought process  Ortho Exam - Low back/SI: No midline spinous process tenderness about the lumbar spine.  There is positive TTP just inferior to the PSIS  near the right SI joint.  There is no radicular symptoms, negative straight leg raise.  There is full range of motion and no worsening of pain with extension; there is full range with flexion but mild pulling sensation on the right.  Imaging:  *I did personally review the lumbar x-rays from 08/11/2023 as well as her flexion and extension views from 12/29/2023, agree with below findings.  12/29/23: XRs of the lumbar spine from 12/29/2023 were independently reviewed and  interpreted, showing no significant degenerative changes.  No fracture or  dislocation seen.  No evidence of instability on flexion/extension views.    08/11/23: Narrative & Impression  3 views lumbar spine back pain and hip pain   Patient shows increased lordosis at the lumbosacral junction with truncal asymmetry in the coronal plane and various levels of sclerosis around the facet joints at the lower part of the spine there is also osteophyte formation noted in the L2 area.   Impression spondylosis with abnormal alignment that does not reach the level of scoliosis    Past Medical/Family/Surgical/Social History: Medications & Allergies reviewed per EMR, new medications updated. Patient Active Problem List   Diagnosis Date Noted   History of hearing loss 01/25/2023   Vocal cord nodule 01/25/2023   Tinnitus of left ear 12/14/2022   Closed fracture of middle phalanx of left ring finger 09/13/2022   Encounter for general adult medical examination with abnormal findings 09/13/2022   Productive cough 09/13/2022   Osteopenia of lumbar spine 02/27/2020   Arthritis, multiple joint involvement 12/12/2019   Nocturnal leg cramps 04/05/2019   Dyslipidemia 12/13/2017   Gastroesophageal reflux disease without esophagitis 12/13/2017   Derangement of posterior horn of medial meniscus of left knee    Derangement of anterior horn of lateral meniscus of left knee    Multiple pulmonary nodules 07/07/2016   Seasonal allergic rhinitis due to pollen 02/11/2016   Tobacco use 11/02/2014   Brain aneurysm 11/02/2014   Crohn disease (HCC) 11/02/2014   Fibromyalgia 11/02/2014   GAD (generalized anxiety disorder) 11/02/2014   Restless leg syndrome 07/10/2014   H/O: CVA (cerebrovascular accident) 07/18/2013   Insomnia 08/16/2012   Vitamin D deficiency 01/22/2011   Osteoporosis without current pathological fracture 01/21/2011   Superficial varicosities 01/21/2011   Tear film insufficiency 01/21/2011   Atrophic vaginitis 01/21/2011   Symptomatic menopausal or female climacteric states 01/21/2011   Unspecified glaucoma 01/21/2011   Past Medical  History:  Diagnosis Date   Anxiety    Brain aneurysm    Crohn disease (HCC)    Depression    Fibromyalgia    GERD (gastroesophageal reflux disease)    Headache    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   Hyperplastic colon polyp    Panic attacks    Stroke (HCC)    TIA (transient ischemic attack)    Tobacco abuse    Family History  Problem Relation Age of Onset   Diabetes Mother    Kidney failure Mother    Stroke Father    Colon cancer Brother        in his 23's   Diabetes Brother    Diabetes Sister    Stroke Paternal Grandfather    Other Sister        perforated bowel with colostomy   Cancer Brother        Eye   Past Surgical History:  Procedure Laterality Date   CATARACT EXTRACTION Bilateral    EYE SURGERY  IR GENERIC HISTORICAL  07/28/2016   IR RADIOLOGIST EVAL & MGMT 07/28/2016 [facility]   KNEE ARTHROSCOPY WITH MEDIAL MENISECTOMY Left 12/01/2017   Procedure: LEFT KNEE ARTHROSCOPY WITH PARTIAL MEDIAL AND LATERAL MENISECTOMY;  Surgeon: Vickki Hearing, MD;  Location: [facility];  Service: Orthopedics;  Laterality: Left;   ORIF ULNAR FRACTURE Left    ROTATOR CUFF REPAIR Left    TONSILLECTOMY     TUBAL LIGATION     VAGINAL HYSTERECTOMY     has her ovaries   VARICOSE VEIN SURGERY Right    Social History   Occupational History   Occupation: PCA    Employer: OTHER    Comment: [employer]  Tobacco Use   Smoking status: Every Day    Current packs/day: 0.50    Average packs/day: 0.5 packs/day for 20.0 years (10.0 ttl pk-yrs)    Types: Cigarettes   Smokeless tobacco: Never  Vaping Use   Vaping status: Former  Substance and Sexual Activity   Alcohol use: No    Alcohol/week: 0.0 standard drinks of alcohol   Drug use: No   Sexual activity: Not Currently    Birth control/protection: None

## 2024-05-12 ENCOUNTER — Encounter (HOSPITAL_COMMUNITY): Payer: Self-pay | Admitting: Interventional Radiology

## 2024-10-22 ENCOUNTER — Other Ambulatory Visit: Payer: Self-pay | Admitting: Orthopedic Surgery

## 2024-10-22 DIAGNOSIS — M5410 Radiculopathy, site unspecified: Secondary | ICD-10-CM

## 2024-11-28 ENCOUNTER — Ambulatory Visit: Admitting: Orthopedic Surgery

## 2024-11-28 DIAGNOSIS — M65331 Trigger finger, right middle finger: Secondary | ICD-10-CM

## 2024-11-28 NOTE — Progress Notes (Signed)
 "  Stacy Hansen - 74 y.o. female MRN [id number]  Date of birth: Aug 06, 1951  Office Visit Note: Visit Date: 11/28/2024 PCP: Teresa Aldona CROME, NP Referred by: Teresa Aldona CROME, NP  Subjective: No chief complaint on file.  HPI: Stacy Hansen is a pleasant 74 y.o. female who presents today for evaluation of right hand long finger triggering and has been present now for greater than 1 year.  She is undergone prior injection to the right long finger A1 pulley by Dr. Margrette with initial relief of symptoms that have since recurred.  She is having notable clicking and catching of the digit, often requiring manual correction.  She is here today to discuss potential surgical options  Pertinent ROS were reviewed with the patient and found to be negative unless otherwise specified above in HPI.   Visit Reason: right hand/long finger Duration of symptoms: 1+ year Hand dominance: left Occupation: retired Diabetic: No Smoking: Yes Heart/Lung History: none Blood Thinners: none  Prior Testing/EMG: none Injections (Date): 1 year ago- Margrette Treatments:injection Prior Surgery:none    Assessment & Plan: Visit Diagnoses:  1. Trigger finger, right middle finger     Plan: Extensive discussion was had with the patient today regarding her right long finger trigger digit.  We discussed the etiology and pathophysiology of stenosing tenosynovitis.  We discussed conservative versus surgical treatment modalities.  From a conservative standpoint, we discussed activity modification, splinting, therapy and injections.  From a surgical standpoint, we discussed the possibility for trigger digit release as well as all risk and benefits associated.  Given that patient has trialed conservative treatments such as prior injection, activity modification and splint with symptoms refractory to conservative care, patient is indicated for right long finger trigger digit release.    Risks and benefits of the procedure  were discussed, risks including but not limited to infection, bleeding, scarring, stiffness, nerve injury, tendon injury, vascular injury, recurrence of symptoms and need for subsequent operation.  We also discussed the appropriate postoperative protocol and timeframe for return to activities and function.  Forms of anesthesia were also discussed.  Patient expressed understanding.  Understanding the above, patient would like to proceed with right long finger trigger digit release under IV sedation.   Follow-up: No follow-ups on file.   Meds & Orders: No orders of the defined types were placed in this encounter.  No orders of the defined types were placed in this encounter.    Procedures: No procedures performed      Clinical History: No specialty comments available.  She reports that she has been smoking cigarettes. She has a 10 pack-year smoking history. She has never used smokeless tobacco. No results for input(s): HGBA1C, LABURIC in the last 8760 hours.  Objective:   Vital Signs: There were no vitals taken for this visit.  Physical Exam  Gen: Well-appearing, in no acute distress; non-toxic CV: Regular Rate. Well-perfused. Warm.  Resp: Breathing unlabored on room air; no wheezing. Psych: Fluid speech in conversation; appropriate affect; normal thought process  Ortho Exam Right hand: - Palpable nodule at the A1 pulley of the long finger, associated tenderness - Notable clicking with deep flexion of the long finger, there is evidence of significant locking with deep flexion - Sensation intact distally, hand remains warm well-perfused    Imaging: No results found.  Past Medical/Family/Surgical/Social History: Medications & Allergies reviewed per EMR, new medications updated. Patient Active Problem List   Diagnosis Date Noted   History of hearing loss 01/25/2023  Vocal cord nodule 01/25/2023   Tinnitus of left ear 12/14/2022   Closed fracture of middle phalanx of left  ring finger 09/13/2022   Encounter for general adult medical examination with abnormal findings 09/13/2022   Productive cough 09/13/2022   Osteopenia of lumbar spine 02/27/2020   Arthritis, multiple joint involvement 12/12/2019   Nocturnal leg cramps 04/05/2019   Dyslipidemia 12/13/2017   Gastroesophageal reflux disease without esophagitis 12/13/2017   Derangement of posterior horn of medial meniscus of left knee    Derangement of anterior horn of lateral meniscus of left knee    Multiple pulmonary nodules 07/07/2016   Seasonal allergic rhinitis due to pollen 02/11/2016   Tobacco use 11/02/2014   Brain aneurysm 11/02/2014   Crohn disease (HCC) 11/02/2014   Fibromyalgia 11/02/2014   GAD (generalized anxiety disorder) 11/02/2014   Restless leg syndrome 07/10/2014   H/O: CVA (cerebrovascular accident) 07/18/2013   Insomnia 08/16/2012   Vitamin D  deficiency 01/22/2011   Osteoporosis without current pathological fracture 01/21/2011   Superficial varicosities 01/21/2011   Tear film insufficiency 01/21/2011   Atrophic vaginitis 01/21/2011   Symptomatic menopausal or female climacteric states 01/21/2011   Unspecified glaucoma 01/21/2011   Past Medical History:  Diagnosis Date   Anxiety    Brain aneurysm    Crohn disease (HCC)    Depression    Fibromyalgia    GERD (gastroesophageal reflux disease)    Headache    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   Hyperplastic colon polyp    Panic attacks    Stroke (HCC)    TIA (transient ischemic attack)    Tobacco abuse    Family History  Problem Relation Age of Onset   Diabetes Mother    Kidney failure Mother    Stroke Father    Colon cancer Brother        in his 8's   Diabetes Brother    Diabetes Sister    Stroke Paternal Grandfather    Other Sister        perforated bowel with colostomy   Cancer Brother        Eye   Past Surgical History:  Procedure Laterality Date   CATARACT EXTRACTION Bilateral    EYE SURGERY      IR GENERIC HISTORICAL  07/28/2016   IR RADIOLOGIST EVAL & MGMT 07/28/2016 [facility]   KNEE ARTHROSCOPY WITH MEDIAL MENISECTOMY Left 12/01/2017   Procedure: LEFT KNEE ARTHROSCOPY WITH PARTIAL MEDIAL AND LATERAL MENISECTOMY;  Surgeon: Margrette Taft BRAVO, MD;  Location: [facility];  Service: Orthopedics;  Laterality: Left;   ORIF ULNAR FRACTURE Left    ROTATOR CUFF REPAIR Left    TONSILLECTOMY     TUBAL LIGATION     VAGINAL HYSTERECTOMY     has her ovaries   VARICOSE VEIN SURGERY Right    Social History   Occupational History   Occupation: PCA    Employer: OTHER    Comment: [employer]  Tobacco Use   Smoking status: Every Day    Current packs/day: 0.50    Average packs/day: 0.5 packs/day for 20.0 years (10.0 ttl pk-yrs)    Types: Cigarettes   Smokeless tobacco: Never  Vaping Use   Vaping status: Former  Substance and Sexual Activity   Alcohol  use: No    Alcohol /week: 0.0 standard drinks of alcohol    Drug use: No   Sexual activity: Not Currently    Birth control/protection: None    Cassara Nida Estela) Arlinda, M.D. [facility], Hand Surgery  "

## 2024-12-13 ENCOUNTER — Other Ambulatory Visit: Payer: Self-pay

## 2024-12-13 DIAGNOSIS — M65331 Trigger finger, right middle finger: Secondary | ICD-10-CM

## 2025-01-10 ENCOUNTER — Encounter: Admitting: Orthopedic Surgery

## 2025-01-10 ENCOUNTER — Encounter: Admitting: Rehabilitative and Restorative Service Providers"
